# Patient Record
Sex: Female | Born: 1950
Health system: Southern US, Community
[De-identification: ages and names within clinical notes are randomized; demographics above are authoritative.]

## PROBLEM LIST (undated history)

## (undated) DIAGNOSIS — K219 Gastro-esophageal reflux disease without esophagitis: Secondary | ICD-10-CM

## (undated) DIAGNOSIS — E785 Hyperlipidemia, unspecified: Secondary | ICD-10-CM

## (undated) DIAGNOSIS — F32A Depression, unspecified: Secondary | ICD-10-CM

## (undated) DIAGNOSIS — T7840XA Allergy, unspecified, initial encounter: Secondary | ICD-10-CM

## (undated) DIAGNOSIS — R911 Solitary pulmonary nodule: Secondary | ICD-10-CM

## (undated) DIAGNOSIS — L930 Discoid lupus erythematosus: Secondary | ICD-10-CM

## (undated) DIAGNOSIS — R011 Cardiac murmur, unspecified: Secondary | ICD-10-CM

## (undated) DIAGNOSIS — H269 Unspecified cataract: Secondary | ICD-10-CM

## (undated) DIAGNOSIS — F329 Major depressive disorder, single episode, unspecified: Secondary | ICD-10-CM

## (undated) DIAGNOSIS — D219 Benign neoplasm of connective and other soft tissue, unspecified: Secondary | ICD-10-CM

## (undated) HISTORY — DX: Discoid lupus erythematosus: L93.0

## (undated) HISTORY — PX: CATARACT EXTRACTION, BILATERAL: SHX1313

## (undated) HISTORY — DX: Cardiac murmur, unspecified: R01.1

## (undated) HISTORY — DX: Benign neoplasm of connective and other soft tissue, unspecified: D21.9

## (undated) HISTORY — DX: Gastro-esophageal reflux disease without esophagitis: K21.9

## (undated) HISTORY — DX: Hyperlipidemia, unspecified: E78.5

## (undated) HISTORY — PX: INNER EAR SURGERY: SHX679

## (undated) HISTORY — PX: APPENDECTOMY: SHX54

## (undated) HISTORY — DX: Solitary pulmonary nodule: R91.1

## (undated) HISTORY — PX: COLONOSCOPY: SHX174

---

## 1989-03-10 HISTORY — PX: CHOLECYSTECTOMY: SHX55

## 1995-03-11 HISTORY — PX: ABDOMINAL HYSTERECTOMY: SHX81

## 1998-07-11 ENCOUNTER — Other Ambulatory Visit: Admission: RE | Admit: 1998-07-11 | Discharge: 1998-07-11 | Payer: Self-pay | Admitting: Obstetrics and Gynecology

## 1999-03-11 HISTORY — PX: HAND SURGERY: SHX662

## 1999-03-11 HISTORY — PX: BREAST SURGERY: SHX581

## 1999-08-13 ENCOUNTER — Other Ambulatory Visit: Admission: RE | Admit: 1999-08-13 | Discharge: 1999-08-13 | Payer: Self-pay | Admitting: Obstetrics and Gynecology

## 1999-09-18 ENCOUNTER — Encounter (INDEPENDENT_AMBULATORY_CARE_PROVIDER_SITE_OTHER): Payer: Self-pay | Admitting: Specialist

## 1999-09-18 ENCOUNTER — Ambulatory Visit (HOSPITAL_COMMUNITY): Admission: RE | Admit: 1999-09-18 | Discharge: 1999-09-18 | Payer: Self-pay | Admitting: General Surgery

## 1999-09-18 ENCOUNTER — Encounter: Payer: Self-pay | Admitting: General Surgery

## 2000-08-13 ENCOUNTER — Other Ambulatory Visit: Admission: RE | Admit: 2000-08-13 | Discharge: 2000-08-13 | Payer: Self-pay | Admitting: Obstetrics and Gynecology

## 2001-08-13 ENCOUNTER — Other Ambulatory Visit: Admission: RE | Admit: 2001-08-13 | Discharge: 2001-08-13 | Payer: Self-pay | Admitting: Obstetrics and Gynecology

## 2002-08-31 ENCOUNTER — Other Ambulatory Visit: Admission: RE | Admit: 2002-08-31 | Discharge: 2002-08-31 | Payer: Self-pay | Admitting: Obstetrics and Gynecology

## 2003-09-04 ENCOUNTER — Other Ambulatory Visit: Admission: RE | Admit: 2003-09-04 | Discharge: 2003-09-04 | Payer: Self-pay | Admitting: Obstetrics and Gynecology

## 2004-09-13 ENCOUNTER — Other Ambulatory Visit: Admission: RE | Admit: 2004-09-13 | Discharge: 2004-09-13 | Payer: Self-pay | Admitting: Addiction Medicine

## 2005-09-15 ENCOUNTER — Other Ambulatory Visit: Admission: RE | Admit: 2005-09-15 | Discharge: 2005-09-15 | Payer: Self-pay | Admitting: Obstetrics and Gynecology

## 2006-09-17 ENCOUNTER — Other Ambulatory Visit: Admission: RE | Admit: 2006-09-17 | Discharge: 2006-09-17 | Payer: Self-pay | Admitting: Obstetrics and Gynecology

## 2007-08-24 ENCOUNTER — Encounter: Payer: Self-pay | Admitting: Family Medicine

## 2007-09-23 ENCOUNTER — Other Ambulatory Visit: Admission: RE | Admit: 2007-09-23 | Discharge: 2007-09-23 | Payer: Self-pay | Admitting: Obstetrics and Gynecology

## 2008-10-02 ENCOUNTER — Encounter: Payer: Self-pay | Admitting: Family Medicine

## 2008-10-02 ENCOUNTER — Encounter: Payer: Self-pay | Admitting: Obstetrics and Gynecology

## 2008-10-02 ENCOUNTER — Other Ambulatory Visit: Admission: RE | Admit: 2008-10-02 | Discharge: 2008-10-02 | Payer: Self-pay | Admitting: Obstetrics and Gynecology

## 2008-10-02 ENCOUNTER — Ambulatory Visit: Payer: Self-pay | Admitting: Obstetrics and Gynecology

## 2008-10-02 LAB — CONVERTED CEMR LAB: Pap Smear: NORMAL

## 2008-12-04 ENCOUNTER — Encounter: Payer: Self-pay | Admitting: Family Medicine

## 2009-09-25 ENCOUNTER — Encounter: Payer: Self-pay | Admitting: Family Medicine

## 2009-10-09 ENCOUNTER — Encounter: Payer: Self-pay | Admitting: Family Medicine

## 2009-10-09 ENCOUNTER — Ambulatory Visit: Payer: Self-pay | Admitting: Obstetrics and Gynecology

## 2009-10-09 ENCOUNTER — Other Ambulatory Visit: Admission: RE | Admit: 2009-10-09 | Discharge: 2009-10-09 | Payer: Self-pay | Admitting: Obstetrics and Gynecology

## 2009-10-09 LAB — CONVERTED CEMR LAB: Pap Smear: NORMAL

## 2009-11-22 ENCOUNTER — Encounter: Payer: Self-pay | Admitting: Family Medicine

## 2009-11-22 ENCOUNTER — Ambulatory Visit: Payer: Self-pay | Admitting: Obstetrics and Gynecology

## 2010-01-15 ENCOUNTER — Ambulatory Visit: Payer: Self-pay | Admitting: Family Medicine

## 2010-01-15 DIAGNOSIS — K219 Gastro-esophageal reflux disease without esophagitis: Secondary | ICD-10-CM | POA: Insufficient documentation

## 2010-01-15 DIAGNOSIS — L930 Discoid lupus erythematosus: Secondary | ICD-10-CM

## 2010-01-15 DIAGNOSIS — Z860100 Personal history of colon polyps, unspecified: Secondary | ICD-10-CM | POA: Insufficient documentation

## 2010-01-16 LAB — CONVERTED CEMR LAB
ALT: 22 units/L (ref 0–35)
AST: 20 units/L (ref 0–37)
Albumin: 4.1 g/dL (ref 3.5–5.2)
Alkaline Phosphatase: 75 units/L (ref 39–117)
BUN: 14 mg/dL (ref 6–23)
Basophils Absolute: 0 10*3/uL (ref 0.0–0.1)
Basophils Relative: 0.7 % (ref 0.0–3.0)
Bilirubin, Direct: 0.2 mg/dL (ref 0.0–0.3)
CO2: 27 meq/L (ref 19–32)
Calcium: 9.2 mg/dL (ref 8.4–10.5)
Chloride: 106 meq/L (ref 96–112)
Creatinine, Ser: 0.8 mg/dL (ref 0.4–1.2)
Eosinophils Absolute: 0.1 10*3/uL (ref 0.0–0.7)
Eosinophils Relative: 1.3 % (ref 0.0–5.0)
GFR calc non Af Amer: 73.43 mL/min (ref >60)
Glucose, Bld: 86 mg/dL (ref 70–99)
HCT: 41.1 % (ref 36.0–46.0)
Hemoglobin: 14.3 g/dL (ref 12.0–15.0)
Lymphocytes Relative: 14.5 % (ref 12.0–46.0)
Lymphs Abs: 0.9 10*3/uL (ref 0.7–4.0)
MCHC: 34.8 g/dL (ref 30.0–36.0)
MCV: 97.4 fL (ref 78.0–100.0)
Monocytes Absolute: 0.4 10*3/uL (ref 0.1–1.0)
Monocytes Relative: 6.2 % (ref 3.0–12.0)
Neutro Abs: 5 10*3/uL (ref 1.4–7.7)
Neutrophils Relative %: 77.3 % — ABNORMAL HIGH (ref 43.0–77.0)
Platelets: 150 10*3/uL (ref 150.0–400.0)
Potassium: 4.3 meq/L (ref 3.5–5.1)
RBC: 4.22 M/uL (ref 3.87–5.11)
RDW: 13.3 % (ref 11.5–14.6)
Sodium: 140 meq/L (ref 135–145)
Total Bilirubin: 0.7 mg/dL (ref 0.3–1.2)
Total Protein: 6.2 g/dL (ref 6.0–8.3)
WBC: 6.5 10*3/uL (ref 4.5–10.5)

## 2010-01-29 ENCOUNTER — Encounter: Payer: Self-pay | Admitting: Family Medicine

## 2010-02-05 ENCOUNTER — Encounter (INDEPENDENT_AMBULATORY_CARE_PROVIDER_SITE_OTHER): Payer: Self-pay | Admitting: *Deleted

## 2010-02-05 ENCOUNTER — Encounter: Payer: Self-pay | Admitting: Family Medicine

## 2010-02-20 ENCOUNTER — Encounter: Payer: Self-pay | Admitting: Family Medicine

## 2010-04-09 NOTE — Letter (Signed)
Summary: [facility]   Imported By: Lanelle Bal 02/07/2010 11:26:31  _____________________________________________________________________  External Attachment:    Type:   Image     Comment:   External Document

## 2010-04-09 NOTE — Miscellaneous (Signed)
  Clinical Lists Changes  Observations: Added new observation of MAMMO DUE: 09/26/2010 (09/25/2009 11:50) Added new observation of MAMMOGRAM: Negative (09/25/2009 11:50)

## 2010-04-09 NOTE — Miscellaneous (Signed)
  Clinical Lists Changes  Observations: Added new observation of DEXANXTDUE: 11/2011 (01/29/2010 14:46) Added new observation of BONE DENSITY: Done (11/22/2009 14:49) Added new observation of PAP SMEAR: normal (10/09/2009 14:48) Added new observation of PAP SMEAR: normal (10/02/2008 14:47)      Preventive Care Screening  Bone Density:    Date:  11/22/2009    Next Due:  11/2011    Results:  Done  Pap Smear:    Date:  10/02/2008    Results:  normal 20

## 2010-04-09 NOTE — Letter (Signed)
Summary: [facility]  [facility]   Imported By: Maryln Gottron 02/04/2010 15:23:11  _____________________________________________________________________  External Attachment:    Type:   Image     Comment:   External Document

## 2010-04-09 NOTE — Letter (Signed)
Summary: [facility]   Imported By: Maryln Gottron 02/04/2010 15:24:58  _____________________________________________________________________  External Attachment:    Type:   Image     Comment:   External Document

## 2010-04-09 NOTE — Miscellaneous (Signed)
  Clinical Lists Changes  Observations: Added new observation of PAST MED HX: GERD- controlled with weight loss Discoid lupus- Dr. Azucena Cecil ([facility]) Dr. Mayford Knife (derm) Gyn- Dr. Oletha Blend (gyn/DXA) Mammogram per Dr. Yolanda Bonine h/o cardiac murmur Echo:  Mild aortic regurgitation within a tri-leaflet valve.  Normal left ventricular size and function.  Normal aortic size.  Mild tricuspid regurgitation. 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 Mayford Knife (derm) Gyn- Dr. Oletha Blend (gyn/DXA) Mammogram per Dr. Yolanda Bonine h/o cardiac murmur Echo:  Mild aortic regurgitation within a tri-leaflet valve.  Normal left ventricular size and function.  Normal aortic size.  Mild tricuspid regurgitation.

## 2010-04-09 NOTE — Assessment & Plan Note (Signed)
Summary: [facility] FROM [facility]   Vital Signs:  Patient profile:   60 year old female Height:      65.5 inches Weight:      155 pounds BMI:     25.49 Temp:     98.3 degrees F oral Pulse rate:   76 / minute Pulse rhythm:   regular BP sitting:   116 / 70  (left arm) Cuff size:   regular  Vitals Entered By: Sydell Axon LPN 16-Jan-2010 11:24 AM) CC: New patient to get established, transfer from [place]   History of Present Illness: tobacco abuse.  continues to smoke.  precontemplative.   Itchy rash, seasonal, on trunk.  Some better with diprolene.  Worse since September.  on hydroxychloroquine for lupus.  needs monitoring labs.    Feeling well o/w.   H/o colon polyps.  due for follow up with [facility].   Gerd improved with weight loss.   Allergies (verified): 1)  ! Chantix  Past History:  Family History: Last updated: 2010-01-16 Father: dead Alcoholism, lung cancer Mother:dead Alcoholism, lung cancer, elevated cholesterol, high blood pressure Siblings: Brother dead Jul 20, 2009, vascular disease.  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 Mayford Knife (derm) Gyn- Dr. Oletha Blend (gyn/DXA) Mammogram per Dr. Yolanda Bonine h/o cardiac murmur  Past Surgical History: Ear surgery  ~ 20-Jul-1960 Hand surgery  ~ Jul 21, 1999 Cholecystectomy  ~ 07/20/89 Hysterectomy  ~ 07-21-1994  Family History: Father: dead Alcoholism, lung cancer Mother:dead Alcoholism, lung cancer, elevated cholesterol, high blood pressure Siblings: Brother dead 2009-07-20, vascular disease.  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  Otherwise negative.    Physical Exam  General:  GEN: nad, alert and oriented HEENT: mucous membranes moist, TM with bilateral  perf, old finding NECK: supple w/o LA CV: rrr.  murmur noted.  PULM: ctab, no inc wob ABD: soft, +bs EXT: no edema SKIN: no acute rash but excorated lesions noted on trunk.  no fluctuance.    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  I would like derm input.  See notes on labs.  Of note, no help with rash/itching with detergent change.  This rash seems to happen in late fall each year.  Requesting old records.  Her updated medication list for this problem includes:    Plaquenil 200 Mg Tabs (Hydroxychloroquine sulfate) .Marland Kitchen... Take two times a day  Orders: TLB-BMP (Basic Metabolic Panel-BMET) (80048-METABOL) TLB-Hepatic/Liver Function Pnl (80076-HEPATIC) TLB-CBC Platelet - w/Differential (85025-CBCD)  Problem # 2:  COLONIC POLYPS, HX OF (ICD-V12.72) Pt to follow up with GI.  Problem # 3:  GERD (ICD-530.81) Improved.   Complete Medication List: 1)  Plaquenil 200 Mg Tabs (Hydroxychloroquine sulfate) .... Take two times a day  Patient Instructions: 1)  You can get your results through our phone system.  Follow the instructions on the blue card.  We'll get your old records.  Let me know if you have any concerns in the meantime.  I would follow up with dermatology  and GI.  Take care.  Glad to see you.    Orders Added: 1)  Est. Patient Level IV [51884] 2)  TLB-BMP (Basic Metabolic Panel-BMET) [80048-METABOL] 3)  TLB-Hepatic/Liver Function Pnl [80076-HEPATIC] 4)  TLB-CBC Platelet - w/Differential [85025-CBCD]   Immunization History:  Influenza Immunization History:    Influenza:  historical (01/08/2010)   Immunization History:  Influenza Immunization History:    Influenza:  Historical (01/08/2010)  Current  Allergies (reviewed today): ! CHANTIX

## 2010-04-09 NOTE — Letter (Signed)
Summary: Results Follow up Letter  [place] [facility]  [address] [place], [place] [address]   Phone: [phone]  Fax: [phone]    02/05/2010 MRN: [id number]    [facility] [address] [place], [place]  [address]    Dear Ms. Laboy,  The following are the results of your recent test(s):  Test         Result    Pap Smear:        Normal _____  Not Normal _____ Comments: ______________________________________________________ Cholesterol: LDL(Bad cholesterol):         Your goal is less than:         HDL (Good cholesterol):       Your goal is more than: Comments:  ______________________________________________________ Mammogram:        Normal __X___  Not Normal _____ Comments:  Yearly follow up is recommended. We received the copy from the July appointment.  ___________________________________________________________________ Hemoccult:        Normal _____  Not normal _______ Comments:    _____________________________________________________________________ Other Tests:    We routinely do not discuss normal results over the telephone.  If you desire a copy of the results, or you have any questions about this information we can discuss them at your next office visit.   Sincerely,    Dwana Curd. Para March, M.D.  [facility]

## 2010-04-11 NOTE — Procedures (Signed)
Summary: Colonoscopy by Dr.John Hayes,[facility]  Colonoscopy by Dr.John Hayes,[facility]   Imported By: Beau Fanny 03/05/2010 08:26:07  _____________________________________________________________________  External Attachment:    Type:   Image     Comment:   External Document

## 2010-04-26 ENCOUNTER — Ambulatory Visit (INDEPENDENT_AMBULATORY_CARE_PROVIDER_SITE_OTHER): Payer: BC Managed Care – PPO | Admitting: Obstetrics and Gynecology

## 2010-04-26 DIAGNOSIS — N764 Abscess of vulva: Secondary | ICD-10-CM

## 2010-04-29 ENCOUNTER — Ambulatory Visit (INDEPENDENT_AMBULATORY_CARE_PROVIDER_SITE_OTHER): Payer: BC Managed Care – PPO | Admitting: Obstetrics and Gynecology

## 2010-04-29 DIAGNOSIS — N764 Abscess of vulva: Secondary | ICD-10-CM

## 2010-05-09 ENCOUNTER — Ambulatory Visit (INDEPENDENT_AMBULATORY_CARE_PROVIDER_SITE_OTHER): Payer: BC Managed Care – PPO | Admitting: Obstetrics and Gynecology

## 2010-05-09 DIAGNOSIS — N764 Abscess of vulva: Secondary | ICD-10-CM

## 2010-07-26 NOTE — Op Note (Signed)
[facility]  Patient:    Ashlee Allen, Ashlee Allen                       MRN: [id number] Proc. Date: 09/18/99 Adm. Date:  [id number] Attending:  Glenna Fellows Tappan                           Operative Report  PREOPERATIVE DIAGNOSIS:  Right nipple discharge.  POSTOPERATIVE DIAGNOSIS:  Right nipple discharge.  OPERATION PERFORMED:  Right breast duct excision.  SURGEON:  Lorne Skeens. Hoxworth, M.D.  ANESTHESIA:  Local with intravenous sedation.  INDICATIONS FOR PROCEDURE:  Ashlee Allen is a 60 year old white female who presents with persistent right nipple single duct bloody spontaneous discharge.  Mammogram and physical exam were unremarkable.  Duct excision has been recommended and accepted.  The nature of the procedure, its indications and risks of bleeding and infection were discussed and understood preoperatively.  She is now brought to the operating room for this procedure.  DESCRIPTION OF PROCEDURE:  The patient was brought to the operating room and placed in supine position on the operating table and IV sedation was administered.  The right breast was sterilely prepped and draped.  Local anesthesia was used to infiltrate the areolar skin and underlying breast tissue.  A curvilinear incision was made at the areolar border inferiorly and dissection carried down into the subcutaneous tissues.  An areolar flap was created toward the nipple and using sharp and blunt dissection, the central ducts were completely encircled at the nipple and tied with a 2-0 silk tie. The ducts were then sharply dissected off of the nipple skin.  There was an obviously dilated central duct with some bloody discharge.  Once the ducts were released from the nipple skin, the central ducts were excised down into the breast tissue for several centimeters and excised.  Hemostasis was obtained with the cautery.  The subcutaneous was then reapproximated with interrupted 4-0 Monocryl  and the skin with running subcuticular 4-0 Monocryl and Steri-Strips.  Sponge, needle and instrument counts were correct.  Dry sterile dressing was applied and the patient taken to recovery in good condition. DD:  09/18/99 TD:  09/18/99 Job: [id number]

## 2010-07-31 ENCOUNTER — Encounter: Payer: Self-pay | Admitting: Family Medicine

## 2010-08-01 ENCOUNTER — Encounter: Payer: Self-pay | Admitting: Family Medicine

## 2010-08-01 ENCOUNTER — Ambulatory Visit (INDEPENDENT_AMBULATORY_CARE_PROVIDER_SITE_OTHER): Payer: BC Managed Care – PPO | Admitting: Family Medicine

## 2010-08-01 VITALS — BP 112/72 | HR 88 | Temp 97.8°F | Wt 157.0 lb

## 2010-08-01 DIAGNOSIS — J40 Bronchitis, not specified as acute or chronic: Secondary | ICD-10-CM

## 2010-08-01 MED ORDER — AZITHROMYCIN 250 MG PO TABS: ORAL_TABLET | ORAL | Status: DC

## 2010-08-01 MED ORDER — ALBUTEROL SULFATE HFA 108 (90 BASE) MCG/ACT IN AERS: 2.0000 | INHALATION_SPRAY | Freq: Four times a day (QID) | RESPIRATORY_TRACT | Status: DC | PRN

## 2010-08-01 NOTE — Assessment & Plan Note (Signed)
D/w pt about smoking.  Given duration and discolored sputum with exam today, I would use SABA and start zmax.  She understood. Okay for outpatient fu.  Call back as needed.  Nontoxic.

## 2010-08-01 NOTE — Progress Notes (Signed)
3 weeks of inc cough, discolored sputum in AM, occ wheeze.  No fevers.  No SOB.  Occ nasal congestion.  No ear pain.  Smoking, d/w pt about quitting.  Not on SABA.   ROS: See HPI.  Otherwise negative.    Meds, vitals, and allergies reviewed.   GEN: nad, alert and oriented HEENT: mucous membranes moist, TM w/o erythema but old B perfs noted, nasal epithelium injected, OP with mild cobblestoning NECK: supple w/o LA CV: rrr. PULM: no inc wob, coarse bs but no wheeze EXT: no edema

## 2010-08-01 NOTE — Patient Instructions (Signed)
Start the antibiotics today and use the inhaler as needed.  Let me know if you aren't getting better.  Take care.

## 2010-10-08 ENCOUNTER — Encounter: Payer: Self-pay | Admitting: Obstetrics and Gynecology

## 2010-10-08 ENCOUNTER — Encounter: Payer: Self-pay | Admitting: *Deleted

## 2010-10-09 ENCOUNTER — Encounter: Payer: Self-pay | Admitting: Family Medicine

## 2010-10-14 ENCOUNTER — Encounter: Payer: Self-pay | Admitting: Obstetrics and Gynecology

## 2010-10-14 ENCOUNTER — Ambulatory Visit (INDEPENDENT_AMBULATORY_CARE_PROVIDER_SITE_OTHER): Payer: BC Managed Care – PPO | Admitting: Obstetrics and Gynecology

## 2010-10-14 ENCOUNTER — Other Ambulatory Visit (HOSPITAL_COMMUNITY)
Admission: RE | Admit: 2010-10-14 | Discharge: 2010-10-14 | Disposition: A | Discharge status: Home / Self Care | Payer: BC Managed Care – PPO | Source: Ambulatory Visit | Attending: Obstetrics and Gynecology | Admitting: Obstetrics and Gynecology

## 2010-10-14 DIAGNOSIS — N951 Menopausal and female climacteric states: Secondary | ICD-10-CM

## 2010-10-14 DIAGNOSIS — N952 Postmenopausal atrophic vaginitis: Secondary | ICD-10-CM | POA: Insufficient documentation

## 2010-10-14 DIAGNOSIS — Z0000 Encounter for general adult medical examination without abnormal findings: Secondary | ICD-10-CM

## 2010-10-14 DIAGNOSIS — Z01419 Encounter for gynecological examination (general) (routine) without abnormal findings: Secondary | ICD-10-CM | POA: Insufficient documentation

## 2010-10-14 DIAGNOSIS — Z780 Asymptomatic menopausal state: Secondary | ICD-10-CM

## 2010-10-14 NOTE — Progress Notes (Signed)
The patient came back to see me today for an annual GYN exam. Her menopausal symptoms are relatively mild. She is however having a lot of dryness with intercourse. She is having intercourse approximately 3 times a week. She is up-to-date on mammograms and bone densities. She does her lab work elsewhere.  ROS: 9 systems reviewed with the patient. The only pertinent positive is that she is now seeing Dr. Nicholas Lose for her lupus. She seems to be doing well with current treatment. She is however also been treated for eczema.  Physical examination: HEENT within normal limits. Neck: Thyroid not large. No masses. Supraclavicular nodes: not enlarged. Breasts: Examined in both sitting midline position. No skin changes and no masses. Abdomen: Soft no guarding rebound or masses or hernia. Pelvic: External: Within normal limits. BUS: Within normal limits. Vaginal:within normal limits. Good estrogen effect. No evidence of cystocele rectocele or enterocele. Cervix and uterus absent. Adnexa: No masses. Rectovaginal exam: Confirmatory and negative. Extremities: Within normal limits.  Assessment: 1. Menopausal symptoms 2. Atrophic vaginitis  Plan: Continue yearly mammograms. It estradiol cream 0.02% 3 times a week in the vagina.

## 2010-12-25 ENCOUNTER — Ambulatory Visit (INDEPENDENT_AMBULATORY_CARE_PROVIDER_SITE_OTHER): Payer: BC Managed Care – PPO

## 2010-12-25 DIAGNOSIS — Z23 Encounter for immunization: Secondary | ICD-10-CM

## 2010-12-26 ENCOUNTER — Ambulatory Visit: Payer: BC Managed Care – PPO | Admitting: Family Medicine

## 2011-04-21 ENCOUNTER — Telehealth: Payer: Self-pay | Admitting: Family Medicine

## 2011-04-21 MED ORDER — AZITHROMYCIN 250 MG PO TABS: ORAL_TABLET | ORAL | Status: DC

## 2011-04-21 NOTE — Telephone Encounter (Signed)
Triage Record Num: [id number] Operator: Caswell Corwin Patient Name: Ashlee Allen Call Date & Time: 04/21/2011 9:38:27AM Patient Phone: [phone] PCP: Patient Gender: Female PCP Fax : Patient DOB: [birth year] Practice Name: [facility] Reason for Call: OFFICE NOTE! Caller: Tavonna/Patient; PCP: Crawford Givens Clelia Croft); CB#: [phone]; Call regarding Has Sinus Infection and Needs Zpack and Wants Ofloxacin Drops for Ear Called In. She had a cold 1.5 wks ago and she has ear congestion. Pt is afebrile. Triaged URI and all emergent SX R/O. PT WANTS A Z-PAK AND OFLOXACIN EAR GTTS CALLED IN FOR SINUS CONGESTION AND EAR CONGESTON. PLEASE CALL OT AT [phone] TO ADVISE. OFFICE NOTE! TY! Protocol(s) Used: Upper Respiratory Infection (URI) Recommended Outcome per Protocol: See Provider within 2 Weeks Reason for Outcome: Recent or recurrent episodes of sneezing, nasal congestion; watery nasal drainage; scratchy/itchy throat; red/itchy/watery eyes or cough unrelieved after one week of home care measures Care Advice: ~ Call provider if symptoms become severe, or if treatment that relieved the symptoms in the past is no longer working. 04/21/2011 9:47:07AM Page 1 of 1 CAN_TriageRpt_V2 [facility] Triage Call Report Triage Record Num: [id number] Operator: Caswell Corwin Patient Name: [name] Call Date & Time: 04/21/2011 9:38:27AM Patient Phone: [phone] PCP: Patient Gender: Female PCP Fax : Patient DOB: [birth year] Practice Name: [facility] Reason for Call: OFFICE NOTE! Caller: Nimrat/Patient; PCP: Crawford Givens Clelia Croft); CB#: [phone]; Call regarding Has Sinus Infection and Needs Zpack and Wants Ofloxacin Drops for Ear Called In. She had a cold 1.5 wks ago and she has ear congestion. Pt is afebrile. Triaged URI and all emergent SX R/O. PT WANTS A Z-PAK AND OFLOXACIN EAR GTTS CALLED IN FOR SINUS CONGESTION AND EAR CONGESTON. PLEASE CALL OT AT [phone] TO  ADVISE. OFFICE NOTE! TY! Protocol(s) Used: Office Note Recommended Outcome per Protocol: Information Noted and Sent to Office Reason for Outcome: Caller information to office Care Advice: ~ 04/21/2011 9:47:08AM Page 1 of 1 CAN_TriageRpt_V2

## 2011-04-21 NOTE — Telephone Encounter (Signed)
I called pt.  She is going to call ENT about the drops.  She still has facial pain, cough for 1.5 weeks.  Okay for zmax, but I want her to talk to ENT about the drops.  She agrees with plan.

## 2011-06-03 ENCOUNTER — Encounter: Payer: Self-pay | Admitting: Family Medicine

## 2011-06-03 ENCOUNTER — Ambulatory Visit (INDEPENDENT_AMBULATORY_CARE_PROVIDER_SITE_OTHER): Payer: BC Managed Care – PPO | Admitting: Family Medicine

## 2011-06-03 VITALS — BP 126/70 | HR 68 | Temp 98.3°F | Wt 161.8 lb

## 2011-06-03 DIAGNOSIS — M549 Dorsalgia, unspecified: Secondary | ICD-10-CM

## 2011-06-03 DIAGNOSIS — J40 Bronchitis, not specified as acute or chronic: Secondary | ICD-10-CM

## 2011-06-03 LAB — POCT URINALYSIS DIPSTICK
Bilirubin, UA: NEGATIVE
Glucose, UA: NEGATIVE
Ketones, UA: NEGATIVE
Leukocytes, UA: NEGATIVE
Protein, UA: NEGATIVE
Spec Grav, UA: 1.02

## 2011-06-03 MED ORDER — AZITHROMYCIN 250 MG PO TABS: ORAL_TABLET | ORAL | Status: AC

## 2011-06-03 NOTE — Patient Instructions (Signed)
Start the antibiotics and use the inhaler.  Let me know if I can help you stop smoking.  Take care. Get some rest.

## 2011-06-03 NOTE — Progress Notes (Signed)
duration of symptoms: started last week Rhinorrhea: yes congestion:yes ear pain: yes, mild on right side sore throat:no Cough: yes, some sputum-clear, with some wheeze Myalgias: some (lower back pain) but u/a is neg other concerns: HA  Husband is sick at home.    We discussed smoking.    ROS: See HPI.  Otherwise negative.    Meds, vitals, and allergies reviewed.   GEN: nad, alert and oriented HEENT: mucous membranes moist, TM w/o erythema but chronic changes noted, nasal epithelium injected, OP with cobblestoning NECK: supple w/o LA CV: rrr. PULM: no inc wob but wheeze, but coarse BS and ronchi ABD: soft, +bs EXT: no edema

## 2011-06-04 NOTE — Assessment & Plan Note (Signed)
Given the lung exam, I would treat with abx and restart SABA.  D/w pt about smoking.  Sample ventolin given with routine instruction.  She felt better after use.  F/u prn.  Nontoxic.

## 2011-10-13 ENCOUNTER — Encounter: Payer: Self-pay | Admitting: Family Medicine

## 2011-10-14 ENCOUNTER — Encounter: Payer: Self-pay | Admitting: *Deleted

## 2011-10-15 ENCOUNTER — Encounter: Payer: Self-pay | Admitting: Obstetrics and Gynecology

## 2011-10-15 ENCOUNTER — Ambulatory Visit (INDEPENDENT_AMBULATORY_CARE_PROVIDER_SITE_OTHER): Payer: BC Managed Care – PPO | Admitting: Obstetrics and Gynecology

## 2011-10-15 VITALS — BP 124/78 | Ht 65.0 in | Wt 159.0 lb

## 2011-10-15 DIAGNOSIS — Z01419 Encounter for gynecological examination (general) (routine) without abnormal findings: Secondary | ICD-10-CM

## 2011-10-15 MED ORDER — ACYCLOVIR 5 % EX CREA: 1.0000 "application " | TOPICAL_CREAM | CUTANEOUS | Status: DC | PRN

## 2011-10-15 MED ORDER — ESTRADIOL 0.1 MG/GM VA CREA: 1.0000 g | TOPICAL_CREAM | Freq: Every day | VAGINAL | Status: DC

## 2011-10-15 NOTE — Progress Notes (Signed)
The patient came to see me today for her annual GYN exam. She is doing well without HRT. She does have vaginal dryness that she would like treatment for. She is having no vaginal bleeding. She is having no pelvic pain. She is up-to-date on mammograms. Her last bone density was September, 2011 and was normal. She has always had normal Pap smears. She has had a total abdominal hysterectomy for fibroids. Her last Pap smear was 2012. She uses Zovirax cream for fever blisters.  HEENT: Within normal limits. Kennon Portela present. Neck: No masses. Supraclavicular lymph nodes: Not enlarged. Breasts: Examined in both sitting and lying position. Symmetrical without skin changes or masses. Abdomen: Soft no masses guarding or rebound. No hernias. Pelvic: External within normal limits. BUS within normal limits. Vaginal examination shows poor  estrogen effect, no cystocele enterocele or rectocele. Cervix and uterus absent. Adnexa within normal limits. Rectovaginal confirmatory. Extremities within normal limits.  Assessment: #1. Atrophic vaginitis #2. HSV 1  Plan: Continue yearly mammograms. Lab through PCP. Prescribed Estrace cream for vaginal dryness. Refilled Zovirax ointment.The new Pap smear guidelines were discussed with the patient. No pap done.

## 2011-10-16 LAB — URINALYSIS W MICROSCOPIC + REFLEX CULTURE
Casts: NONE SEEN
Hgb urine dipstick: NEGATIVE
Leukocytes, UA: NEGATIVE
Nitrite: NEGATIVE
Specific Gravity, Urine: 1.015 (ref 1.005–1.030)
Squamous Epithelial / LPF: NONE SEEN
pH: 6 (ref 5.0–8.0)

## 2011-12-19 ENCOUNTER — Encounter: Payer: Self-pay | Admitting: Family Medicine

## 2012-02-23 ENCOUNTER — Telehealth: Payer: Self-pay | Admitting: *Deleted

## 2012-02-23 NOTE — Telephone Encounter (Signed)
Patient advised.

## 2012-10-21 ENCOUNTER — Encounter: Payer: Self-pay | Admitting: Gynecology

## 2012-10-28 ENCOUNTER — Ambulatory Visit (INDEPENDENT_AMBULATORY_CARE_PROVIDER_SITE_OTHER): Payer: BC Managed Care – PPO | Admitting: Gynecology

## 2012-10-28 ENCOUNTER — Ambulatory Visit (HOSPITAL_COMMUNITY)
Admission: RE | Admit: 2012-10-28 | Discharge: 2012-10-28 | Disposition: A | Discharge status: Home / Self Care | Payer: BC Managed Care – PPO | Source: Ambulatory Visit | Attending: Gynecology | Admitting: Gynecology

## 2012-10-28 ENCOUNTER — Encounter: Payer: Self-pay | Admitting: Gynecology

## 2012-10-28 VITALS — BP 120/74 | Ht 63.25 in | Wt 158.0 lb

## 2012-10-28 DIAGNOSIS — N952 Postmenopausal atrophic vaginitis: Secondary | ICD-10-CM

## 2012-10-28 DIAGNOSIS — N951 Menopausal and female climacteric states: Secondary | ICD-10-CM | POA: Insufficient documentation

## 2012-10-28 DIAGNOSIS — Z01419 Encounter for gynecological examination (general) (routine) without abnormal findings: Secondary | ICD-10-CM

## 2012-10-28 DIAGNOSIS — Z90710 Acquired absence of both cervix and uterus: Secondary | ICD-10-CM | POA: Insufficient documentation

## 2012-10-28 DIAGNOSIS — Z1159 Encounter for screening for other viral diseases: Secondary | ICD-10-CM

## 2012-10-28 DIAGNOSIS — Z780 Asymptomatic menopausal state: Secondary | ICD-10-CM

## 2012-10-28 DIAGNOSIS — R32 Unspecified urinary incontinence: Secondary | ICD-10-CM

## 2012-10-28 DIAGNOSIS — Z87410 Personal history of cervical dysplasia: Secondary | ICD-10-CM | POA: Insufficient documentation

## 2012-10-28 DIAGNOSIS — F17200 Nicotine dependence, unspecified, uncomplicated: Secondary | ICD-10-CM | POA: Insufficient documentation

## 2012-10-28 NOTE — Patient Instructions (Addendum)
Shingles Vaccine What You Need to Know WHAT IS SHINGLES?  Shingles is a painful skin rash, often with blisters. It is also called Herpes Zoster or just Zoster.  A shingles rash usually appears on one side of the face or body and lasts from 2 to 4 weeks. Its main symptom is pain, which can be quite severe. Other symptoms of shingles can include fever, headache, chills, and upset stomach. Very rarely, a shingles infection can lead to pneumonia, hearing problems, blindness, brain inflammation (encephalitis), or death.  For about 1 person in 5, severe pain can continue even after the rash clears up. This is called post-herpetic neuralgia.  Shingles is caused by the Varicella Zoster virus. This is the same virus that causes chickenpox. Only someone who has had a case of chickenpox or rarely, has gotten chickenpox vaccine, can get shingles. The virus stays in your body. It can reappear many years later to cause a case of shingles.  You cannot catch shingles from another person with shingles. However, a person who has never had chickenpox (or chickenpox vaccine) could get chickenpox from someone with shingles. This is not very common.  Shingles is far more common in people 50 and older than in younger people. It is also more common in people whose immune systems are weakened because of a disease such as cancer or drugs such as steroids or chemotherapy.  At least 1 million people get shingles per year in the [place]. SHINGLES VACCINE  A vaccine for shingles was licensed in 2006. In clinical trials, the vaccine reduced the risk of shingles by 50%. It can also reduce the pain in people who still get shingles after being vaccinated.  A single dose of shingles vaccine is recommended for adults 60 years of age and older. SOME PEOPLE SHOULD NOT GET SHINGLES VACCINE OR SHOULD WAIT A person should not get shingles vaccine if he or she:  Has ever had a life-threatening allergic reaction to gelatin, the  antibiotic neomycin, or any other component of shingles vaccine. Tell your caregiver if you have any severe allergies.  Has a weakened immune system because of current:  AIDS or another disease that affects the immune system.  Treatment with drugs that affect the immune system, such as prolonged use of high-dose steroids.  Cancer treatment, such as radiation or chemotherapy.  Cancer affecting the bone marrow or lymphatic system, such as leukemia or lymphoma.  Is pregnant, or might be pregnant. Women should not become pregnant until at least 4 weeks after getting shingles vaccine. Someone with a minor illness, such as a cold, may be vaccinated. Anyone with a moderate or severe acute illness should usually wait until he or she recovers before getting the vaccine. This includes anyone with a temperature of 101.3 F (38 C) or higher. WHAT ARE THE RISKS FROM SHINGLES VACCINE?  A vaccine, like any medicine, could possibly cause serious problems, such as severe allergic reactions. However, the risk of a vaccine causing serious harm, or death, is extremely small.  No serious problems have been identified with shingles vaccine. Mild Problems  Redness, soreness, swelling, or itching at the site of the injection (about 1 person in 3).  Headache (about 1 person in 70). Like all vaccines, shingles vaccine is being closely monitored for unusual or severe problems. WHAT IF THERE IS A MODERATE OR SEVERE REACTION? What should I look for? Any unusual condition, such as a severe allergic reaction or a high fever. If a severe allergic reaction   occurred, it would be within a few minutes to an hour after the shot. Signs of a serious allergic reaction can include difficulty breathing, weakness, hoarseness or wheezing, a fast heartbeat, hives, dizziness, paleness, or swelling of the throat. What should I do?  Call your caregiver, or get the person to a caregiver right away.  Tell the caregiver what  happened, the date and time it happened, and when the vaccination was given.  Ask the caregiver to report the reaction by filing a Vaccine Adverse Event Reporting System (VAERS) form. Or, you can file this report through the VAERS web site at www.vaers.LAgents.no or by calling 1-404-003-6499. VAERS does not provide medical advice. HOW CAN I LEARN MORE?  Ask your caregiver. He or she can give you the vaccine package insert or suggest other sources of information.  Contact the Centers for Disease Control and Prevention (CDC):  Call (409) 145-5542 (1-800-CDC-INFO).  Visit the CDC website at PicCapture.uy CDC Shingles Vaccine VIS (12/14/07) Document Released: 12/22/2005 Document Revised: 05/19/2011 Document Reviewed: 12/14/2007 [employer] Patient Information 2014 [place], [place]. Kegel Exercises The goal of Kegel exercises is to isolate and exercise your pelvic floor muscles. These muscles act as a hammock that supports the rectum, vagina, small intestine, and uterus. As the muscles weaken, the hammock sags and these organs are displaced from their normal positions. Kegel exercises can strengthen your pelvic floor muscles and help you to improve bladder and bowel control, improve sexual response, and help reduce many problems and some discomfort during pregnancy. Kegel exercises can be done anywhere and at any time. HOW TO PERFORM KEGEL EXERCISES 1. Locate your pelvic floor muscles. To do this, squeeze (contract) the muscles that you use when you try to stop the flow of urine. You will feel a tightness in the vaginal area (women) and a tight lift in the rectal area (men and women). 2. When you begin, contract your pelvic muscles tight for 2 5 seconds, then relax them for 2 5 seconds. This is one set. Do 4 5 sets with a short pause in between. 3. Contract your pelvic muscles for 8 10 seconds, then relax them for 8 10 seconds. Do 4 5 sets. If you cannot contract your pelvic muscles for 8 10 seconds,  try 5 7 seconds and work your way up to 8 10 seconds. Your goal is 4 5 sets of 10 contractions each day. Keep your stomach, buttocks, and legs relaxed during the exercises. Perform sets of both short and long contractions. Vary your positions. Perform these contractions 3 4 times per day. Perform sets while you are:   Lying in bed in the morning.  Standing at lunch.  Sitting in the late afternoon.  Lying in bed at night. You should do 40 50 contractions per day. Do not perform more Kegel exercises per day than recommended. Overexercising can cause muscle fatigue. Continue these exercises for for at least 15 20 weeks or as directed by your caregiver. Document Released: 02/11/2012 Document Reviewed: 11/20/2011 [facility] Patient Information 2014 [place], [place]. Smoking Cessation Quitting smoking is important to your health and has many advantages. However, it is not always easy to quit since nicotine is a very addictive drug. Often times, people try 3 times or more before being able to quit. This document explains the best ways for you to prepare to quit smoking. Quitting takes hard work and a lot of effort, but you can do it. ADVANTAGES OF QUITTING SMOKING  You will live longer, feel better, and live  better.  Your body will feel the impact of quitting smoking almost immediately.  Within 20 minutes, blood pressure decreases. Your pulse returns to its normal level.  After 8 hours, carbon monoxide levels in the blood return to normal. Your oxygen level increases.  After 24 hours, the chance of having a heart attack starts to decrease. Your breath, hair, and body stop smelling like smoke.  After 48 hours, damaged nerve endings begin to recover. Your sense of taste and smell improve.  After 72 hours, the body is virtually free of nicotine. Your bronchial tubes relax and breathing becomes easier.  After 2 to 12 weeks, lungs can hold more air. Exercise becomes easier and circulation  improves.  The risk of having a heart attack, stroke, cancer, or lung disease is greatly reduced.  After 1 year, the risk of coronary heart disease is cut in half.  After 5 years, the risk of stroke falls to the same as a nonsmoker.  After 10 years, the risk of lung cancer is cut in half and the risk of other cancers decreases significantly.  After 15 years, the risk of coronary heart disease drops, usually to the level of a nonsmoker.  If you are pregnant, quitting smoking will improve your chances of having a healthy baby.  The people you live with, especially any children, will be healthier.  You will have extra money to spend on things other than cigarettes. QUESTIONS TO THINK ABOUT BEFORE ATTEMPTING TO QUIT You may want to talk about your answers with your caregiver.  Why do you want to quit?  If you tried to quit in the past, what helped and what did not?  What will be the most difficult situations for you after you quit? How will you plan to handle them?  Who can help you through the tough times? Your family? Friends? A caregiver?  What pleasures do you get from smoking? What ways can you still get pleasure if you quit? Here are some questions to ask your caregiver:  How can you help me to be successful at quitting?  What medicine do you think would be best for me and how should I take it?  What should I do if I need more help?  What is smoking withdrawal like? How can I get information on withdrawal? GET READY  Set a quit date.  Change your environment by getting rid of all cigarettes, ashtrays, matches, and lighters in your home, car, or work. Do not let people smoke in your home.  Review your past attempts to quit. Think about what worked and what did not. GET SUPPORT AND ENCOURAGEMENT You have a better chance of being successful if you have help. You can get support in many ways.  Tell your family, friends, and co-workers that you are going to quit and need  their support. Ask them not to smoke around you.  Get individual, group, or telephone counseling and support. Programs are available at [employer] and health centers. Call your local health department for information about programs in your area.  Spiritual beliefs and practices may help some smokers quit.  Download a "quit meter" on your computer to keep track of quit statistics, such as how long you have gone without smoking, cigarettes not smoked, and money saved.  Get a self-help book about quitting smoking and staying off of tobacco. LEARN NEW SKILLS AND BEHAVIORS  Distract yourself from urges to smoke. Talk to someone, go for a walk, or occupy your  time with a task.  Change your normal routine. Take a different route to work. Drink tea instead of coffee. Eat breakfast in a different place.  Reduce your stress. Take a hot bath, exercise, or read a book.  Plan something enjoyable to do every day. Reward yourself for not smoking.  Explore interactive web-based programs that specialize in helping you quit. GET MEDICINE AND USE IT CORRECTLY Medicines can help you stop smoking and decrease the urge to smoke. Combining medicine with the above behavioral methods and support can greatly increase your chances of successfully quitting smoking.  Nicotine replacement therapy helps deliver nicotine to your body without the negative effects and risks of smoking. Nicotine replacement therapy includes nicotine gum, lozenges, inhalers, nasal sprays, and skin patches. Some may be available over-the-counter and others require a prescription.  Antidepressant medicine helps people abstain from smoking, but how this works is unknown. This medicine is available by prescription.  Nicotinic receptor partial agonist medicine simulates the effect of nicotine in your brain. This medicine is available by prescription. Ask your caregiver for advice about which medicines to use and how to use them based on  your health history. Your caregiver will tell you what side effects to look out for if you choose to be on a medicine or therapy. Carefully read the information on the package. Do not use any other product containing nicotine while using a nicotine replacement product.  RELAPSE OR DIFFICULT SITUATIONS Most relapses occur within the first 3 months after quitting. Do not be discouraged if you start smoking again. Remember, most people try several times before finally quitting. You may have symptoms of withdrawal because your body is used to nicotine. You may crave cigarettes, be irritable, feel very hungry, cough often, get headaches, or have difficulty concentrating. The withdrawal symptoms are only temporary. They are strongest when you first quit, but they will go away within 10 14 days. To reduce the chances of relapse, try to:  Avoid drinking alcohol. Drinking lowers your chances of successfully quitting.  Reduce the amount of caffeine you consume. Once you quit smoking, the amount of caffeine in your body increases and can give you symptoms, such as a rapid heartbeat, sweating, and anxiety.  Avoid smokers because they can make you want to smoke.  Do not let weight gain distract you. Many smokers will gain weight when they quit, usually less than 10 pounds. Eat a healthy diet and stay active. You can always lose the weight gained after you quit.  Find ways to improve your mood other than smoking. FOR MORE INFORMATION  www.smokefree.gov  Document Released: 02/18/2001 Document Revised: 08/26/2011 Document Reviewed: 06/05/2011 [facility] Patient Information 2014 [place], [place].

## 2012-10-28 NOTE — Progress Notes (Addendum)
Ashlee Allen December 05, 1950 [id number]   History:    62 y.o.  for annual gyn exam with no complaints today. Review of patient's records indicated that she had a total abdominal hysterectomy for fibroids along with a Burch bladder suspension back in 1997. She was on vaginal estrogen twice a week and is no longer on it. She states that occasionally she still will leak urine when coughing or straining and she does wear a pad. She is a chronic smoker for many years and currently smokes one half packs per day and tried Chantix in the past it made her sick. Her last bone density study was normal in 2011. Her mammogram this year was normal and she states that occasionally she will do her breast exam. Her vaccines are up-to-date with the exception of the shingles vaccine. Patient has history of lupus.patient with no prior history of abnormal Pap smears.  Past medical history,surgical history, family history and social history were all reviewed and documented in the EPIC chart.  Gynecologic History No LMP recorded. Patient has had a hysterectomy. Contraception: status post hysterectomy Last Pap: 2012. Results were: normal Last mammogram: 2014. Results were: normal  Obstetric History OB History  Gravida Para Term Preterm AB SAB TAB Ectopic Multiple Living  3 2 2  1     2     # Outcome Date GA Lbr Len/2nd Weight Sex Delivery Anes PTL Lv  3 ABT           2 TRM           1 TRM                ROS: A ROS was performed and pertinent positives and negatives are included in the history.  GENERAL: No fevers or chills. HEENT: No change in vision, no earache, sore throat or sinus congestion. NECK: No pain or stiffness. CARDIOVASCULAR: No chest pain or pressure. No palpitations. PULMONARY: No shortness of breath, cough or wheeze. GASTROINTESTINAL: No abdominal pain, nausea, vomiting or diarrhea, melena or bright red blood per rectum. GENITOURINARY: No urinary frequency, urgency, hesitancy or dysuria.  MUSCULOSKELETAL: No joint or muscle pain, no back pain, no recent trauma. DERMATOLOGIC: No rash, no itching, no lesions. ENDOCRINE: No polyuria, polydipsia, no heat or cold intolerance. No recent change in weight. HEMATOLOGICAL: No anemia or easy bruising or bleeding. NEUROLOGIC: No headache, seizures, numbness, tingling or weakness. PSYCHIATRIC: No depression, no loss of interest in normal activity or change in sleep pattern.     Exam: chaperone present  BP 120/74  Ht 5' 3.25" (1.607 m)  Wt 158 lb (71.668 kg)  BMI 27.75 kg/m2  Body mass index is 27.75 kg/(m^2).  General appearance : Well developed well nourished female. No acute distress HEENT: Neck supple, trachea midline, no carotid bruits, no thyroidmegaly Lungs: Clear to auscultation, no rhonchi or wheezes, or rib retractions  Heart: Regular rate and rhythm, no murmurs or gallops Breast:Examined in sitting and supine position were symmetrical in appearance, no palpable masses or tenderness,  no skin retraction, no nipple inversion, no nipple discharge, no skin discoloration, no axillary or supraclavicular lymphadenopathy Abdomen: no palpable masses or tenderness, no rebound or guarding Extremities: no edema or skin discoloration or tenderness  Pelvic:  Bartholin, Urethra, Skene Glands: Within normal limits             Vagina: No gross lesions or discharge  Cervix:Absent  Uterus absent  Adnexa  Without masses or tenderness  Anus and perineum  normal  Rectovaginal  normal sphincter tone without palpated masses or tenderness             Hemoccult Course provided     Assessment/Plan:  62 y.o. female for annual exam postmenopausal no longer on vaginal estrogen. Mild stress urinary incontinence. We'll provide ligature information on Kegel exercises. We had a discussion once again all the detrimental effects of smoking. She will be referred to local support group to assist her in this continuing this bad habits. She will be sent for  chest x-ray PA and lateral today. She was reminded to submit to the office in Hemoccult cards for testing. She will check with her primary physician in reference to her shingles vaccine.  New CDC guidelines is recommending patients be tested once in her lifetime for hepatitis C antibody who were born between [birth year] through 1966. This was discussed with the patient today and has agreed to be tested today.    Ok Edwards MD, 9:34 AM 10/28/2012

## 2012-11-01 ENCOUNTER — Other Ambulatory Visit: Payer: Self-pay | Admitting: Gynecology

## 2012-11-01 ENCOUNTER — Telehealth: Payer: Self-pay

## 2012-11-01 DIAGNOSIS — Z13820 Encounter for screening for osteoporosis: Secondary | ICD-10-CM

## 2012-11-01 NOTE — Telephone Encounter (Signed)
Pt's husband left v/m wanting to know when pt needs next colonoscopy; pt had last colonoscopy at [facility] on 02/20/10 according to pts record.Please advise.

## 2012-11-01 NOTE — Telephone Encounter (Signed)
I thought she was to have 5 year f/u on the colonoscopy.  It would be reasonable to stay with [facility] as she has seen that clinic prev.  I would have her call GI to make sure, but I don't think she is due yet.  Thanks.

## 2012-11-02 NOTE — Telephone Encounter (Signed)
Patient advised.

## 2012-12-03 ENCOUNTER — Encounter: Payer: Self-pay | Admitting: Family Medicine

## 2012-12-30 ENCOUNTER — Ambulatory Visit (INDEPENDENT_AMBULATORY_CARE_PROVIDER_SITE_OTHER): Payer: BC Managed Care – PPO

## 2012-12-30 DIAGNOSIS — Z13820 Encounter for screening for osteoporosis: Secondary | ICD-10-CM

## 2012-12-31 ENCOUNTER — Other Ambulatory Visit: Payer: Self-pay | Admitting: *Deleted

## 2012-12-31 DIAGNOSIS — Z13820 Encounter for screening for osteoporosis: Secondary | ICD-10-CM

## 2013-01-04 ENCOUNTER — Other Ambulatory Visit: Payer: BC Managed Care – PPO

## 2013-01-04 DIAGNOSIS — Z13820 Encounter for screening for osteoporosis: Secondary | ICD-10-CM

## 2013-01-05 ENCOUNTER — Other Ambulatory Visit: Payer: BC Managed Care – PPO

## 2013-01-05 LAB — VITAMIN D 25 HYDROXY (VIT D DEFICIENCY, FRACTURES): Vit D, 25-Hydroxy: 41 ng/mL (ref 30–89)

## 2013-01-05 LAB — PTH, INTACT AND CALCIUM: Calcium: 9.3 mg/dL (ref 8.4–10.5)

## 2013-04-28 ENCOUNTER — Encounter: Payer: Self-pay | Admitting: Family Medicine

## 2013-04-28 ENCOUNTER — Ambulatory Visit (INDEPENDENT_AMBULATORY_CARE_PROVIDER_SITE_OTHER): Payer: BC Managed Care – PPO | Admitting: Family Medicine

## 2013-04-28 VITALS — BP 122/66 | HR 77 | Temp 98.0°F | Wt 161.5 lb

## 2013-04-28 DIAGNOSIS — S6990XA Unspecified injury of unspecified wrist, hand and finger(s), initial encounter: Secondary | ICD-10-CM

## 2013-04-28 DIAGNOSIS — S61409A Unspecified open wound of unspecified hand, initial encounter: Secondary | ICD-10-CM

## 2013-04-28 DIAGNOSIS — J40 Bronchitis, not specified as acute or chronic: Secondary | ICD-10-CM

## 2013-04-28 DIAGNOSIS — Z23 Encounter for immunization: Secondary | ICD-10-CM

## 2013-04-28 DIAGNOSIS — S61419A Laceration without foreign body of unspecified hand, initial encounter: Secondary | ICD-10-CM

## 2013-04-28 NOTE — Progress Notes (Signed)
Pre-visit discussion using our clinic review tool. No additional management support is needed unless otherwise documented below in the visit note.  5PM yesterday.  Accidentally cut her L hand on a barb wire fence.  ~1cm.  L palm.  Distally NV intact w/o ROM deficit.  Not bleeding now.  Cleaned with hot soapy water and neosporin last night.    Cough.  Chest congestion.  No fevers.  No sputum usually, occ clear sputum.  No ear pain or ST.  Some rhinorrhea, occ.  Not SOB.  Hasn't had to use her SABA.    Meds, vitals, and allergies reviewed.   ROS: See HPI.  Otherwise, noncontributory.  GEN: nad, alert and oriented HEENT: mucous membranes moist, tm w/o erythema, nasal exam w/o erythema, clear discharge noted,  OP with cobblestoning NECK: supple w/o LA CV: rrr.   PULM: ctab except for occ mild rhonchi B, no inc wob EXT: no edema SKIN: superficial L palm lesion, 1 cm, at distal 4th MC, distally NV intact.   No FB on inspection with magnification.  Wound doesn't gape with ROM of hand. Not bleeding.   

## 2013-04-28 NOTE — Patient Instructions (Signed)
It doesn't look like you need antibiotics at this point.  Keep the strips on for now.  Notify us if any fever or spreading redness or pus drainage.  Use the inhaler as needed.  Take care.

## 2013-04-29 ENCOUNTER — Telehealth: Payer: Self-pay | Admitting: Family Medicine

## 2013-04-29 DIAGNOSIS — S61419A Laceration without foreign body of unspecified hand, initial encounter: Secondary | ICD-10-CM | POA: Insufficient documentation

## 2013-04-29 NOTE — Assessment & Plan Note (Signed)
Likely a smoker's cough, possible viral source.  Benign exam, d/w pt about smoking cessation ans SABA use.  Wouldn't start abx or pred now. No wheeze on exam.  No inc in wob.

## 2013-04-29 NOTE — Telephone Encounter (Signed)
Relevant patient education assigned to patient using Emmi. ° °

## 2013-04-29 NOTE — Assessment & Plan Note (Signed)
Soaked and irrigated, superficial and clean appearing. No FB on magnification.  Closed with 2 small steristrips with routine cautions.  She agrees.  Tetanus updated.

## 2013-05-17 ENCOUNTER — Other Ambulatory Visit: Payer: Self-pay | Admitting: Physician Assistant

## 2013-05-17 ENCOUNTER — Encounter (HOSPITAL_BASED_OUTPATIENT_CLINIC_OR_DEPARTMENT_OTHER): Payer: Self-pay | Admitting: *Deleted

## 2013-05-17 NOTE — Progress Notes (Signed)
No labs needed

## 2013-05-17 NOTE — H&P (Signed)
Ashlee Allen is an 63 y.o. female.   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  Failed conservative treatment wanting to proceed with surgery.  Past Medical History  Diagnosis Date  . GERD (gastroesophageal reflux disease)     controllled with weight loss   . Discoid lupus     Dr. Kalman Shan (prev seen at [facility]) and Dr. Ubaldo Glassing  . Fibroid   . Depression   . Cardiac murmur     echo 8-9 yr ago-not sure where-no tx    Past Surgical History  Procedure Laterality Date  . Hand surgery  2001    ctr-both  . Inner ear surgery      left  . Cholecystectomy  1991  . Abdominal hysterectomy  1997    TAH  Burch  . Colonoscopy    . Breast surgery  2001    Duct excised-rt    Family History  Problem Relation Age of Onset  . Alcohol abuse Mother   . Hyperlipidemia Mother   . Hypertension Mother   . Diabetes Mother   . Alcohol abuse Father   . Cancer Father     lung  . Seizures Brother    Social History:  reports that she has been smoking.  She has never used smokeless tobacco. She reports that she does not drink alcohol or use illicit drugs.  Allergies:  Allergies  Allergen Reactions  . Varenicline Tartrate     REACTION: nightmares     (Not in a hospital admission)  No results found for this or any previous visit (from the past 48 hour(s)). No results found.  Review of Systems  Constitutional: Negative.   HENT: Positive for hearing loss. Negative for congestion, ear discharge, ear pain, nosebleeds, sore throat and tinnitus.   Eyes: Negative.   Respiratory: Negative.  Negative for stridor.   Cardiovascular: Negative.   Gastrointestinal: Negative.   Genitourinary: Negative.   Musculoskeletal: Positive for joint pain.  Skin: Positive for rash. Negative for itching.  Neurological: Negative.  Negative for headaches.  Endo/Heme/Allergies: Negative.    Psychiatric/Behavioral: Negative.     There were no vitals taken for this visit. Physical Exam  Constitutional: She is oriented to person, place, and time. She appears well-developed and well-nourished. No distress.  HENT:  Head: Normocephalic and atraumatic.  Nose: Nose normal.  Eyes: Conjunctivae and EOM are normal. Pupils are equal, round, and reactive to light.  Neck: Normal range of motion. Neck supple.  Cardiovascular: Normal rate, regular rhythm and intact distal pulses.   Respiratory: Effort normal. No respiratory distress.  GI: Soft. She exhibits no distension.  Musculoskeletal:       Left knee: She exhibits swelling and bony tenderness. She exhibits no effusion, no ecchymosis, no deformity, no laceration, no erythema, normal alignment, no LCL laxity and no MCL laxity. Tenderness found. Medial joint line and lateral joint line tenderness noted.  Neurological: She is alert and oriented to person, place, and time.  Skin: Skin is warm and dry. No erythema.  Psychiatric: She has a normal mood and affect. Her behavior is normal.     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  This will be done as outpatient procedure general anesthesia.    Chriss Czar 05/17/2013, 1:31 PM

## 2013-05-20 ENCOUNTER — Encounter (HOSPITAL_BASED_OUTPATIENT_CLINIC_OR_DEPARTMENT_OTHER): Payer: Self-pay | Admitting: *Deleted

## 2013-05-20 ENCOUNTER — Encounter (HOSPITAL_BASED_OUTPATIENT_CLINIC_OR_DEPARTMENT_OTHER)
Admission: RE | Disposition: A | Discharge status: Home / Self Care | Payer: Self-pay | Source: Ambulatory Visit | Attending: Orthopedic Surgery

## 2013-05-20 ENCOUNTER — Ambulatory Visit (HOSPITAL_BASED_OUTPATIENT_CLINIC_OR_DEPARTMENT_OTHER): Payer: BC Managed Care – PPO | Admitting: Certified Registered"

## 2013-05-20 ENCOUNTER — Encounter (HOSPITAL_BASED_OUTPATIENT_CLINIC_OR_DEPARTMENT_OTHER): Payer: BC Managed Care – PPO | Admitting: Certified Registered"

## 2013-05-20 ENCOUNTER — Ambulatory Visit (HOSPITAL_BASED_OUTPATIENT_CLINIC_OR_DEPARTMENT_OTHER)
Admission: RE | Admit: 2013-05-20 | Discharge: 2013-05-20 | Disposition: A | Discharge status: Home / Self Care | Payer: BC Managed Care – PPO | Source: Ambulatory Visit | Attending: Orthopedic Surgery | Admitting: Orthopedic Surgery

## 2013-05-20 DIAGNOSIS — K219 Gastro-esophageal reflux disease without esophagitis: Secondary | ICD-10-CM | POA: Insufficient documentation

## 2013-05-20 DIAGNOSIS — F17200 Nicotine dependence, unspecified, uncomplicated: Secondary | ICD-10-CM | POA: Insufficient documentation

## 2013-05-20 DIAGNOSIS — R011 Cardiac murmur, unspecified: Secondary | ICD-10-CM | POA: Insufficient documentation

## 2013-05-20 DIAGNOSIS — M2240 Chondromalacia patellae, unspecified knee: Secondary | ICD-10-CM | POA: Insufficient documentation

## 2013-05-20 DIAGNOSIS — M1710 Unilateral primary osteoarthritis, unspecified knee: Secondary | ICD-10-CM | POA: Insufficient documentation

## 2013-05-20 DIAGNOSIS — S83289A Other tear of lateral meniscus, current injury, unspecified knee, initial encounter: Secondary | ICD-10-CM | POA: Insufficient documentation

## 2013-05-20 DIAGNOSIS — IMO0002 Reserved for concepts with insufficient information to code with codable children: Secondary | ICD-10-CM | POA: Insufficient documentation

## 2013-05-20 DIAGNOSIS — F329 Major depressive disorder, single episode, unspecified: Secondary | ICD-10-CM | POA: Insufficient documentation

## 2013-05-20 DIAGNOSIS — M6750 Plica syndrome, unspecified knee: Secondary | ICD-10-CM | POA: Insufficient documentation

## 2013-05-20 DIAGNOSIS — X58XXXA Exposure to other specified factors, initial encounter: Secondary | ICD-10-CM | POA: Insufficient documentation

## 2013-05-20 DIAGNOSIS — F3289 Other specified depressive episodes: Secondary | ICD-10-CM | POA: Insufficient documentation

## 2013-05-20 DIAGNOSIS — L930 Discoid lupus erythematosus: Secondary | ICD-10-CM | POA: Insufficient documentation

## 2013-05-20 HISTORY — PX: KNEE ARTHROSCOPY: SHX127

## 2013-05-20 HISTORY — DX: Depression, unspecified: F32.A

## 2013-05-20 HISTORY — DX: Major depressive disorder, single episode, unspecified: F32.9

## 2013-05-20 LAB — POCT HEMOGLOBIN-HEMACUE: Hemoglobin: 14.2 g/dL (ref 12.0–15.0)

## 2013-05-20 SURGERY — ARTHROSCOPY, KNEE
Anesthesia: General | Site: Knee | Laterality: Left

## 2013-05-20 MED ORDER — EPINEPHRINE HCL 1 MG/ML IJ SOLN
INTRAMUSCULAR | Status: DC | PRN
  Administered 2013-05-20: 1 mg

## 2013-05-20 MED ORDER — DEXAMETHASONE SODIUM PHOSPHATE 10 MG/ML IJ SOLN
INTRAMUSCULAR | Status: DC | PRN
  Administered 2013-05-20: 10 mg via INTRAVENOUS

## 2013-05-20 MED ORDER — HYDROMORPHONE HCL PF 1 MG/ML IJ SOLN
0.2500 mg | INTRAMUSCULAR | Status: DC | PRN
Start: 2013-05-20 — End: 2013-05-20

## 2013-05-20 MED ORDER — CEFAZOLIN SODIUM-DEXTROSE 2-3 GM-% IV SOLR
INTRAVENOUS | Status: AC
  Filled 2013-05-20: qty 50

## 2013-05-20 MED ORDER — FENTANYL CITRATE 0.05 MG/ML IJ SOLN
50.0000 ug | INTRAMUSCULAR | Status: DC | PRN
  Administered 2013-05-20: 100 ug via INTRAVENOUS

## 2013-05-20 MED ORDER — CEFAZOLIN SODIUM-DEXTROSE 2-3 GM-% IV SOLR
2.0000 g | INTRAVENOUS | Status: AC
  Administered 2013-05-20: 2 g via INTRAVENOUS

## 2013-05-20 MED ORDER — BUPIVACAINE-EPINEPHRINE 0.5% -1:200000 IJ SOLN
INTRAMUSCULAR | Status: DC | PRN
  Administered 2013-05-20: 15 mL

## 2013-05-20 MED ORDER — PROMETHAZINE HCL 25 MG/ML IJ SOLN
6.2500 mg | INTRAMUSCULAR | Status: DC | PRN
Start: 2013-05-20 — End: 2013-05-20

## 2013-05-20 MED ORDER — FENTANYL CITRATE 0.05 MG/ML IJ SOLN
INTRAMUSCULAR | Status: DC | PRN
  Administered 2013-05-20 (×2): 50 ug via INTRAVENOUS

## 2013-05-20 MED ORDER — METHYLPREDNISOLONE ACETATE 80 MG/ML IJ SUSP
INTRAMUSCULAR | Status: AC
  Filled 2013-05-20: qty 1

## 2013-05-20 MED ORDER — CHLORHEXIDINE GLUCONATE 4 % EX LIQD: 60.0000 mL | Freq: Once | CUTANEOUS | Status: DC

## 2013-05-20 MED ORDER — METHYLPREDNISOLONE ACETATE 40 MG/ML IJ SUSP
INTRAMUSCULAR | Status: DC | PRN
  Administered 2013-05-20: 40 mg

## 2013-05-20 MED ORDER — METHYLPREDNISOLONE ACETATE 40 MG/ML IJ SUSP
INTRAMUSCULAR | Status: AC
  Filled 2013-05-20: qty 1

## 2013-05-20 MED ORDER — OXYCODONE HCL 5 MG/5ML PO SOLN: 5.0000 mg | Freq: Once | ORAL | Status: DC | PRN

## 2013-05-20 MED ORDER — ONDANSETRON HCL 4 MG/2ML IJ SOLN
INTRAMUSCULAR | Status: DC | PRN
  Administered 2013-05-20: 4 mg via INTRAVENOUS

## 2013-05-20 MED ORDER — FENTANYL CITRATE 0.05 MG/ML IJ SOLN
INTRAMUSCULAR | Status: AC
  Filled 2013-05-20: qty 2

## 2013-05-20 MED ORDER — FENTANYL CITRATE 0.05 MG/ML IJ SOLN
INTRAMUSCULAR | Status: AC
  Filled 2013-05-20: qty 6

## 2013-05-20 MED ORDER — MIDAZOLAM HCL 2 MG/2ML IJ SOLN
INTRAMUSCULAR | Status: AC
  Filled 2013-05-20: qty 2

## 2013-05-20 MED ORDER — PROPOFOL 10 MG/ML IV BOLUS
INTRAVENOUS | Status: DC | PRN
  Administered 2013-05-20: 150 mg via INTRAVENOUS

## 2013-05-20 MED ORDER — MIDAZOLAM HCL 5 MG/5ML IJ SOLN
INTRAMUSCULAR | Status: DC | PRN
  Administered 2013-05-20: 2 mg via INTRAVENOUS

## 2013-05-20 MED ORDER — SODIUM CHLORIDE 0.9 % IV SOLN: INTRAVENOUS | Status: DC

## 2013-05-20 MED ORDER — LACTATED RINGERS IV SOLN
INTRAVENOUS | Status: DC
  Administered 2013-05-20 (×2): via INTRAVENOUS

## 2013-05-20 MED ORDER — SODIUM CHLORIDE 0.9 % IR SOLN
Status: DC | PRN
  Administered 2013-05-20: 6000 mL

## 2013-05-20 MED ORDER — MIDAZOLAM HCL 2 MG/2ML IJ SOLN
1.0000 mg | INTRAMUSCULAR | Status: DC | PRN
  Administered 2013-05-20: 2 mg via INTRAVENOUS

## 2013-05-20 MED ORDER — OXYCODONE HCL 5 MG PO TABS: 5.0000 mg | ORAL_TABLET | Freq: Once | ORAL | Status: DC | PRN

## 2013-05-20 MED ORDER — LIDOCAINE HCL (CARDIAC) 20 MG/ML IV SOLN
INTRAVENOUS | Status: DC | PRN
  Administered 2013-05-20: 60 mg via INTRAVENOUS

## 2013-05-20 MED ORDER — BUPIVACAINE-EPINEPHRINE PF 0.5-1:200000 % IJ SOLN
INTRAMUSCULAR | Status: AC
  Filled 2013-05-20: qty 30

## 2013-05-20 SURGICAL SUPPLY — 47 items
BANDAGE ELASTIC 6 VELCRO ST LF (GAUZE/BANDAGES/DRESSINGS) ×1 IMPLANT
BANDAGE ESMARK 6X9 LF (GAUZE/BANDAGES/DRESSINGS) IMPLANT
BLADE 4.2CUDA (BLADE) ×1 IMPLANT
BLADE CUDA 5.5 (BLADE) IMPLANT
BLADE CUDA GRT WHITE 3.5 (BLADE) ×1 IMPLANT
BLADE CUDA SHAVER 3.5 (BLADE) IMPLANT
BLADE CUTTER MENIS 5.5 (BLADE) IMPLANT
BLADE GREAT WHITE 4.2 (BLADE) IMPLANT
BNDG CMPR 9X6 STRL LF SNTH (GAUZE/BANDAGES/DRESSINGS)
BNDG ESMARK 6X9 LF (GAUZE/BANDAGES/DRESSINGS)
BNDG GAUZE ELAST 4 BULKY (GAUZE/BANDAGES/DRESSINGS) ×2 IMPLANT
BRUSH SCRUB EZ PLAIN DRY (MISCELLANEOUS) ×1 IMPLANT
CANISTER SUCT 3000ML (MISCELLANEOUS) IMPLANT
CUTTER MENISCUS  4.2MM (BLADE)
CUTTER MENISCUS 4.2MM (BLADE) IMPLANT
DRAPE ARTHROSCOPY W/POUCH 114 (DRAPES) ×2 IMPLANT
DRSG EMULSION OIL 3X3 NADH (GAUZE/BANDAGES/DRESSINGS) ×2 IMPLANT
DURAPREP 26ML APPLICATOR (WOUND CARE) ×2 IMPLANT
GLOVE BIOGEL PI IND STRL 7.0 (GLOVE) IMPLANT
GLOVE BIOGEL PI IND STRL 8 (GLOVE) ×2 IMPLANT
GLOVE BIOGEL PI INDICATOR 7.0 (GLOVE) ×1
GLOVE BIOGEL PI INDICATOR 8 (GLOVE) ×1
GLOVE SURG ORTHO 8.0 STRL STRW (GLOVE) ×1 IMPLANT
GLOVE SURG SS PI 6.5 STRL IVOR (GLOVE) ×1 IMPLANT
GLOVE SURG SS PI 8.0 STRL IVOR (GLOVE) ×1 IMPLANT
GOWN STRL REUS W/ TWL LRG LVL3 (GOWN DISPOSABLE) ×1 IMPLANT
GOWN STRL REUS W/ TWL XL LVL3 (GOWN DISPOSABLE) ×1 IMPLANT
GOWN STRL REUS W/TWL LRG LVL3 (GOWN DISPOSABLE) ×2
GOWN STRL REUS W/TWL XL LVL3 (GOWN DISPOSABLE) ×2
HOLDER KNEE FOAM BLUE (MISCELLANEOUS) ×2 IMPLANT
KNEE WRAP E Z 3 GEL PACK (MISCELLANEOUS) ×1 IMPLANT
MANIFOLD NEPTUNE II (INSTRUMENTS) IMPLANT
NDL SAFETY ECLIPSE 18X1.5 (NEEDLE) ×1 IMPLANT
NEEDLE HYPO 18GX1.5 SHARP (NEEDLE) ×4
PACK ARTHROSCOPY DSU (CUSTOM PROCEDURE TRAY) ×2 IMPLANT
PACK BASIN DAY SURGERY FS (CUSTOM PROCEDURE TRAY) ×2 IMPLANT
SET ARTHROSCOPY TUBING (MISCELLANEOUS) ×2
SET ARTHROSCOPY TUBING LN (MISCELLANEOUS) ×1 IMPLANT
SPONGE GAUZE 4X4 12PLY (GAUZE/BANDAGES/DRESSINGS) ×2 IMPLANT
SUT ETHILON 4 0 PS 2 18 (SUTURE) ×2 IMPLANT
SYR 5ML LL (SYRINGE) ×2 IMPLANT
SYR CONTROL 10ML LL (SYRINGE) ×1 IMPLANT
TOWEL OR 17X24 6PK STRL BLUE (TOWEL DISPOSABLE) ×2 IMPLANT
WAND 3.0 CAPSURE 30 DEG W/CORD (SURGICAL WAND) IMPLANT
WAND 30 DEG SABER W/CORD (SURGICAL WAND) IMPLANT
WAND STAR VAC 90 (SURGICAL WAND) IMPLANT
WATER STERILE IRR 1000ML POUR (IV SOLUTION) ×2 IMPLANT

## 2013-05-20 NOTE — Anesthesia Procedure Notes (Addendum)
Anesthesia Regional Block:  Knee block  Pre-Anesthetic Checklist: ,, timeout performed, Correct Patient, Correct Site, Correct Laterality, Correct Procedure, Correct Position, site marked, Risks and benefits discussed, Surgical consent,  Pre-op evaluation,  Post-op pain management  Laterality: Left and Lower  Prep: chloraprep       Needles:   Needle Type: Other     Needle Length: 3cm  Needle Gauge: 22 and 22 G    Additional Needles: Knee block Narrative:  Start time: 05/20/2013 9:30 AM End time: 05/20/2013 9:45 AM Injection made incrementally with aspirations every 5 mL.  Performed by: Personally  Anesthesiologist: T Massagee  Additional Notes: Tolerated well   Procedure Name: LMA Insertion Date/Time: 05/20/2013 10:01 AM Performed by: Zacharius Funari Pre-anesthesia Checklist: Patient identified, Emergency Drugs available, Suction available and Patient being monitored Patient Re-evaluated:Patient Re-evaluated prior to inductionOxygen Delivery Method: Circle System Utilized Preoxygenation: Pre-oxygenation with 100% oxygen Intubation Type: IV induction Ventilation: Mask ventilation without difficulty LMA: LMA inserted LMA Size: 4.0 Number of attempts: 1 Airway Equipment and Method: bite block Placement Confirmation: positive ETCO2 Tube secured with: Tape Dental Injury: Teeth and Oropharynx as per pre-operative assessment     Procedure Name: LMA Insertion Date/Time: 05/20/2013 10:01 AM Performed by: Stellah Donovan Pre-anesthesia Checklist: Patient identified, Emergency Drugs available, Suction available and Patient being monitored Patient Re-evaluated:Patient Re-evaluated prior to inductionOxygen Delivery Method: Circle System Utilized Preoxygenation: Pre-oxygenation with 100% oxygen Intubation Type: IV induction Ventilation: Mask ventilation without difficulty LMA: LMA inserted LMA Size: 4.0 Number of attempts: 1 Airway Equipment and Method: bite  block Placement Confirmation: positive ETCO2 Tube secured with: Tape Dental Injury: Teeth and Oropharynx as per pre-operative assessment

## 2013-05-20 NOTE — Progress Notes (Signed)
Assisted Dr. Massagee with left, knee block. Side rails up, monitors on throughout procedure. See vital signs in flow sheet. Tolerated Procedure well. 

## 2013-05-20 NOTE — Anesthesia Preprocedure Evaluation (Addendum)
Anesthesia Evaluation  Patient identified by MRN, date of birth, ID band Patient awake    History of Anesthesia Complications Negative for: history of anesthetic complications  Airway Mallampati: I  Neck ROM: Full    Dental  (+) Teeth Intact   Pulmonary Current Smoker,  breath sounds clear to auscultation        Cardiovascular Rhythm:Regular Rate:Normal     Neuro/Psych    GI/Hepatic GERD-  ,  Endo/Other    Renal/GU      Musculoskeletal   Abdominal   Peds  Hematology   Anesthesia Other Findings   Reproductive/Obstetrics                          Anesthesia Physical Anesthesia Plan  ASA: II  Anesthesia Plan: General   Post-op Pain Management:    Induction: Intravenous  Airway Management Planned: LMA  Additional Equipment:   Intra-op Plan:   Post-operative Plan: Extubation in OR  Informed Consent: I have reviewed the patients History and Physical, chart, labs and discussed the procedure including the risks, benefits and alternatives for the proposed anesthesia with the patient or authorized representative who has indicated his/her understanding and acceptance.   Dental advisory given  Plan Discussed with: CRNA and Surgeon  Anesthesia Plan Comments:         Anesthesia Quick Evaluation

## 2013-05-20 NOTE — H&P (View-Only) (Signed)
Pre-visit discussion using our clinic review tool. No additional management support is needed unless otherwise documented below in the visit note.  5PM yesterday.  Accidentally cut her L hand on a barb wire fence.  ~1cm.  L palm.  Distally NV intact w/o ROM deficit.  Not bleeding now.  Cleaned with hot soapy water and neosporin last night.    Cough.  Chest congestion.  No fevers.  No sputum usually, occ clear sputum.  No ear pain or ST.  Some rhinorrhea, occ.  Not SOB.  Hasn't had to use her SABA.    Meds, vitals, and allergies reviewed.   ROS: See HPI.  Otherwise, noncontributory.  GEN: nad, alert and oriented HEENT: mucous membranes moist, tm w/o erythema, nasal exam w/o erythema, clear discharge noted,  OP with cobblestoning NECK: supple w/o LA CV: rrr.   PULM: ctab except for occ mild rhonchi B, no inc wob EXT: no edema SKIN: superficial L palm lesion, 1 cm, at distal 4th MC, distally NV intact.   No FB on inspection with magnification.  Wound doesn't gape with ROM of hand. Not bleeding.

## 2013-05-20 NOTE — Interval H&P Note (Signed)
History and Physical Interval Note:  05/20/2013 9:31 AM  [name]  has presented today for surgery, with the diagnosis of chondromalcia - patella, tear knee cartilage NOS  The various methods of treatment have been discussed with the patient and family. After consideration of risks, benefits and other options for treatment, the patient has consented to  Procedure(s): LEFT KNEE ARTHROSCOPY WITH DEBRIDEMENT/SHAVING (CHONDROPLASTY) AND MEDIAL MENISECTOMY (Left) as a surgical intervention .  The patient's history has been reviewed, patient examined, no change in status, stable for surgery.  I have reviewed the patient's chart and labs.  Questions were answered to the patient's satisfaction.     Lashuna Tamashiro JR,W D

## 2013-05-20 NOTE — Transfer of Care (Signed)
Immediate Anesthesia Transfer of Care Note  Patient: Ashlee Allen  Procedure(s) Performed: Procedure(s): LEFT KNEE ARTHROSCOPY WITH DEBRIDEMENT/SHAVING (CHONDROPLASTY) AND LATERAL AND MEDIAL MENISECTOMY, EXCISION OF PLICA (Left)  Patient Location: PACU  Anesthesia Type:GA combined with regional for post-op pain  Level of Consciousness: awake, alert , oriented and patient cooperative  Airway & Oxygen Therapy: Patient Spontanous Breathing and Patient connected to face mask oxygen  Post-op Assessment: Report given to PACU RN and Post -op Vital signs reviewed and stable  Post vital signs: Reviewed and stable  Complications: No apparent anesthesia complications

## 2013-05-20 NOTE — H&P (View-Only) (Signed)
Ashlee Allen is an 62 y.o. female.   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  Failed conservative treatment wanting to proceed with surgery.  Past Medical History  Diagnosis Date  . GERD (gastroesophageal reflux disease)     controllled with weight loss   . Discoid lupus     Dr. Burton (prev seen at [facility]) and Dr. Lomax  . Fibroid   . Depression   . Cardiac murmur     echo 8-9 yr ago-not sure where-no tx    Past Surgical History  Procedure Laterality Date  . Hand surgery  2001    ctr-both  . Inner ear surgery      left  . Cholecystectomy  1991  . Abdominal hysterectomy  1997    [facility]  [facility]  . Colonoscopy    . Breast surgery  2001    Duct excised-rt    Family History  Problem Relation Age of Onset  . Alcohol abuse Mother   . Hyperlipidemia Mother   . Hypertension Mother   . Diabetes Mother   . Alcohol abuse Father   . Cancer Father     lung  . Seizures Brother    Social History:  reports that she has been smoking.  She has never used smokeless tobacco. She reports that she does not drink alcohol or use illicit drugs.  Allergies:  Allergies  Allergen Reactions  . Varenicline Tartrate     REACTION: nightmares     (Not in a hospital admission)  No results found for this or any previous visit (from the past 48 hour(s)). No results found.  Review of Systems  Constitutional: Negative.   HENT: Positive for hearing loss. Negative for congestion, ear discharge, ear pain, nosebleeds, sore throat and tinnitus.   Eyes: Negative.   Respiratory: Negative.  Negative for stridor.   Cardiovascular: Negative.   Gastrointestinal: Negative.   Genitourinary: Negative.   Musculoskeletal: Positive for joint pain.  Skin: Positive for rash. Negative for itching.  Neurological: Negative.  Negative for headaches.  Endo/Heme/Allergies: Negative.    Psychiatric/Behavioral: Negative.     There were no vitals taken for this visit. Physical Exam  Constitutional: She is oriented to person, place, and time. She appears well-developed and well-nourished. No distress.  HENT:  Head: Normocephalic and atraumatic.  Nose: Nose normal.  Eyes: Conjunctivae and EOM are normal. Pupils are equal, round, and reactive to light.  Neck: Normal range of motion. Neck supple.  Cardiovascular: Normal rate, regular rhythm and intact distal pulses.   Respiratory: Effort normal. No respiratory distress.  GI: Soft. She exhibits no distension.  Musculoskeletal:       Left knee: She exhibits swelling and bony tenderness. She exhibits no effusion, no ecchymosis, no deformity, no laceration, no erythema, normal alignment, no LCL laxity and no MCL laxity. Tenderness found. Medial joint line and lateral joint line tenderness noted.  Neurological: She is alert and oriented to person, place, and time.  Skin: Skin is warm and dry. No erythema.  Psychiatric: She has a normal mood and affect. Her behavior is normal.     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  This will be done as outpatient procedure general anesthesia.    Ashlee Allen 05/17/2013, 1:31 PM    

## 2013-05-20 NOTE — Interval H&P Note (Signed)
History and Physical Interval Note:  05/20/2013 9:31 AM  Ashlee Allen  has presented today for surgery, with the diagnosis of chondromalcia - patella, tear knee cartilage NOS  The various methods of treatment have been discussed with the patient and family. After consideration of risks, benefits and other options for treatment, the patient has consented to  Procedure(s): LEFT KNEE ARTHROSCOPY WITH DEBRIDEMENT/SHAVING (CHONDROPLASTY) AND MEDIAL MENISECTOMY (Left) as a surgical intervention .  The patient's history has been reviewed, patient examined, no change in status, stable for surgery.  I have reviewed the patient's chart and labs.  Questions were answered to the patient's satisfaction.     Calib Wadhwa JR,W D   

## 2013-05-20 NOTE — Anesthesia Postprocedure Evaluation (Signed)
  Anesthesia Post-op Note  Patient: Ashlee Allen  Procedure(s) Performed: Procedure(s): LEFT KNEE ARTHROSCOPY WITH DEBRIDEMENT/SHAVING (CHONDROPLASTY) AND LATERAL AND MEDIAL MENISECTOMY, EXCISION OF PLICA (Left)  Patient Location: PACU  Anesthesia Type:General  Level of Consciousness: awake and alert   Airway and Oxygen Therapy: Patient Spontanous Breathing  Post-op Pain: mild  Post-op Assessment: Post-op Vital signs reviewed  Post-op Vital Signs: stable  Complications: No apparent anesthesia complications

## 2013-05-20 NOTE — Discharge Instructions (Signed)
Diet: As you were doing prior to hospitalization  Activity:  Increase activity slowly as tolerated                  No lifting or driving for 2 weeks  Shower:  May shower without a dressing Sunday, NO SOAKING in tub.  Dressing:  You may change your dressing on Sunday                     Weight Bearing:   weight bearing as tolerated.  Use a walker or                    Crutches as needed.  To prevent constipation: you may use a stool softener such as -               Colace ( over the counter) 100 mg by mouth twice a day                Drink plenty of fluids ( prune juice may be helpful) and high fiber foods                Miralax ( over the counter) for constipation as needed.    Precautions:  If you experience chest pain or shortness of breath - call 911 immediately               For transfer to the hospital emergency department!!               If you develop a fever greater that 101 F, purulent drainage from wound,                             increased redness or drainage from wound, or calf pain -- Call the office  Follow- Up Appointment:  Please call for an appointment to be seen in 1 week               [address] - [phone]    Post Anesthesia Home Care Instructions  Activity: Get plenty of rest for the remainder of the day. A responsible adult should stay with you for 24 hours following the procedure.  For the next 24 hours, DO NOT: -Drive a car -Paediatric nurse -Drink alcoholic beverages -Take any medication unless instructed by your physician -Make any legal decisions or sign important papers.  Meals: Start with liquid foods such as gelatin or soup. Progress to regular foods as tolerated. Avoid greasy, spicy, heavy foods. If nausea and/or vomiting occur, drink only clear liquids until the nausea and/or vomiting subsides. Call your physician if vomiting continues.  Special Instructions/Symptoms: Your throat may feel dry or sore from the anesthesia or the breathing  tube placed in your throat during surgery. If this causes discomfort, gargle with warm salt water. The discomfort should disappear within 24 hours.   Regional Anesthesia Blocks  1. Numbness or the inability to move the "blocked" extremity may last from 3-48 hours after placement. The length of time depends on the medication injected and your individual response to the medication. If the numbness is not going away after 48 hours, call your surgeon.  2. The extremity that is blocked will need to be protected until the numbness is gone and the  Strength has returned. Because you cannot feel it, you will need to take extra care to avoid injury. Because it may be weak, you may have difficulty moving it or  using it. You may not know what position it is in without looking at it while the block is in effect.  3. For blocks in the legs and feet, returning to weight bearing and walking needs to be done carefully. You will need to wait until the numbness is entirely gone and the strength has returned. You should be able to move your leg and foot normally before you try and bear weight or walk. You will need someone to be with you when you first try to ensure you do not fall and possibly risk injury.  4. Bruising and tenderness at the needle site are common side effects and will resolve in a few days.  5. Persistent numbness or new problems with movement should be communicated to the surgeon or the [facility] [phone] [place] [phone]).

## 2013-05-23 ENCOUNTER — Encounter (HOSPITAL_BASED_OUTPATIENT_CLINIC_OR_DEPARTMENT_OTHER): Payer: Self-pay | Admitting: Orthopedic Surgery

## 2013-05-23 NOTE — Op Note (Signed)
NAMEGAZELLA, ANGLIN NO.:  [id number]  MEDICAL RECORD NO.:  [id number]  LOCATION:                                 FACILITY:  PHYSICIAN:  Lockie Pares, M.D.    DATE OF BIRTH:  1950-07-31  DATE OF PROCEDURE:  05/20/2013 DATE OF DISCHARGE:  05/20/2013                              OPERATIVE REPORT   INDICATIONS:  Intractable knee pain.  MRI proven medial and lateral meniscus tear in left knee thought to be amenable by outpatient surgery.  PREOPERATIVE DIAGNOSES: 1. Torn medial and lateral menisci, left knee. 2. Tricompartmental osteoarthritis. 3. Excision plica.  POSTOPERATIVE DIAGNOSES: 1. Torn medial and lateral menisci, left knee. 2. Tricompartmental osteoarthritis. 3. Excision plica.  OPERATION: 1. Medial and lateral meniscectomies. 2. Tricompartmental debridement chondroplasty. 3. Plica excision.  SURGEON:  Lockie Pares, M.D.  ANESTHESIA:  MAC, local supplementation.  DESCRIPTION OF PROCEDURE:  Inferomedial and inferolateral portals inspected.  Systemic inspection of the knee showed she had a very thick medial shelf that was resected and grade 3 chondromalacia on the medial facet of the patella which was debrided.  Trochlear groove relatively spared.  Extensive debridement __________ patellofemoral joint.  Grade 3 changes more advanced on the lateral side of the knee on the tibia and then on the tibia on the medial side small frame type tear of the medial meniscus requiring resection of 10% to 50% meniscus substance, more complex tear of the posterior horn, mid portion of the lateral meniscus requiring resection of 20% to 30% __________ meniscus resection.  We did not have to violate the popliteal hiatus though it was somewhat large. Chondroplasty carried out and knee drained free of fluid.  Portals were closed with nylon and infiltrated with Marcaine in the portals 0.5% 10 mL distal, 5 mL  40 mg Depo-Medrol on the joint.  Light  compressive sterile dressing applied.  Taken to recovery room in stable condition.     Lockie Pares, M.D.     WDC/MEDQ  D:  05/20/2013  T:  05/20/2013  Job:  [id number]

## 2013-06-19 ENCOUNTER — Emergency Department (HOSPITAL_BASED_OUTPATIENT_CLINIC_OR_DEPARTMENT_OTHER)
Admission: EM | Admit: 2013-06-19 | Discharge: 2013-06-19 | Disposition: A | Discharge status: Home / Self Care | Payer: BC Managed Care – PPO | Attending: Emergency Medicine | Admitting: Emergency Medicine

## 2013-06-19 ENCOUNTER — Encounter (HOSPITAL_BASED_OUTPATIENT_CLINIC_OR_DEPARTMENT_OTHER): Payer: Self-pay | Admitting: Emergency Medicine

## 2013-06-19 DIAGNOSIS — Z79899 Other long term (current) drug therapy: Secondary | ICD-10-CM | POA: Insufficient documentation

## 2013-06-19 DIAGNOSIS — R011 Cardiac murmur, unspecified: Secondary | ICD-10-CM | POA: Insufficient documentation

## 2013-06-19 DIAGNOSIS — Z8659 Personal history of other mental and behavioral disorders: Secondary | ICD-10-CM | POA: Insufficient documentation

## 2013-06-19 DIAGNOSIS — R112 Nausea with vomiting, unspecified: Secondary | ICD-10-CM

## 2013-06-19 DIAGNOSIS — K219 Gastro-esophageal reflux disease without esophagitis: Secondary | ICD-10-CM | POA: Insufficient documentation

## 2013-06-19 DIAGNOSIS — R197 Diarrhea, unspecified: Secondary | ICD-10-CM | POA: Insufficient documentation

## 2013-06-19 DIAGNOSIS — Z91040 Latex allergy status: Secondary | ICD-10-CM | POA: Insufficient documentation

## 2013-06-19 DIAGNOSIS — Z872 Personal history of diseases of the skin and subcutaneous tissue: Secondary | ICD-10-CM | POA: Insufficient documentation

## 2013-06-19 DIAGNOSIS — F17200 Nicotine dependence, unspecified, uncomplicated: Secondary | ICD-10-CM | POA: Insufficient documentation

## 2013-06-19 LAB — BASIC METABOLIC PANEL
BUN: 17 mg/dL (ref 6–23)
CHLORIDE: 101 meq/L (ref 96–112)
CO2: 22 mEq/L (ref 19–32)
CREATININE: 0.8 mg/dL (ref 0.50–1.10)
Calcium: 9.4 mg/dL (ref 8.4–10.5)
GFR calc non Af Amer: 77 mL/min — ABNORMAL LOW (ref >90)
GFR, EST AFRICAN AMERICAN: 89 mL/min — AB (ref >90)
Glucose, Bld: 114 mg/dL — ABNORMAL HIGH (ref 70–99)
Potassium: 4.1 mEq/L (ref 3.7–5.3)
Sodium: 137 mEq/L (ref 137–147)

## 2013-06-19 MED ORDER — SODIUM CHLORIDE 0.9 % IV BOLUS (SEPSIS)
500.0000 mL | Freq: Once | INTRAVENOUS | Status: AC
  Administered 2013-06-19: 500 mL via INTRAVENOUS

## 2013-06-19 MED ORDER — ONDANSETRON 8 MG PO TBDP: ORAL_TABLET | ORAL | Status: DC

## 2013-06-19 MED ORDER — ONDANSETRON HCL 4 MG/2ML IJ SOLN
4.0000 mg | Freq: Once | INTRAMUSCULAR | Status: AC
  Administered 2013-06-19: 4 mg via INTRAVENOUS
  Filled 2013-06-19: qty 2

## 2013-06-19 NOTE — ED Provider Notes (Signed)
CSN: [id number]     Arrival date & time 06/19/13  0555 History   First MD Initiated Contact with Patient 06/19/13 0604     Chief Complaint  Patient presents with  . Emesis     (Consider location/radiation/quality/duration/timing/severity/associated sxs/prior Treatment) Patient is a 63 y.o. female presenting with vomiting. The history is provided by the patient.  Emesis Severity:  Moderate Timing:  Intermittent Quality:  Stomach contents Progression:  Unchanged Chronicity:  New Recent urination:  Normal Context: not post-tussive   Relieved by:  Nothing Worsened by:  Nothing tried Ineffective treatments:  None tried Associated symptoms: diarrhea   Associated symptoms: no abdominal pain   Diarrhea:    Quality:  Watery   Severity:  Moderate   Timing:  Intermittent   Progression:  Unchanged Risk factors: sick contacts   Risk factors comment:  Multiple family members who ate together on Easter who now have same   Past Medical History  Diagnosis Date  . GERD (gastroesophageal reflux disease)     controllled with weight loss   . Discoid lupus     Dr. Kalman Shan (prev seen at [facility]) and Dr. Ubaldo Glassing  . Fibroid   . Depression   . Cardiac murmur     echo 8-9 yr ago-not sure where-no tx   Past Surgical History  Procedure Laterality Date  . Hand surgery  2001    ctr-both  . Inner ear surgery      left  . Cholecystectomy  1991  . Abdominal hysterectomy  1997    [facility]  [facility]  . Colonoscopy    . Breast surgery  2001    Duct excised-rt  . Knee arthroscopy Left 05/20/2013    Procedure: LEFT KNEE ARTHROSCOPY WITH DEBRIDEMENT/SHAVING (CHONDROPLASTY) AND LATERAL AND MEDIAL MENISECTOMY, EXCISION OF PLICA;  Surgeon: Yvette Rack., MD;  Location: [facility];  Service: Orthopedics;  Laterality: Left;   Family History  Problem Relation Age of Onset  . Alcohol abuse Mother   . Hyperlipidemia Mother   . Hypertension Mother   . Diabetes Mother   . Alcohol abuse Father   .  Cancer Father     lung  . Seizures Brother    History  Substance Use Topics  . Smoking status: Current Every Day Smoker -- 1.00 packs/day  . Smokeless tobacco: Never Used  . Alcohol Use: No   OB History   Grav Para Term Preterm Abortions TAB SAB Ect Mult Living   3 2 2  1     2      Review of Systems  Constitutional: Negative for fever.  Gastrointestinal: Positive for vomiting and diarrhea. Negative for abdominal pain.  All other systems reviewed and are negative.     Allergies  Latex and Varenicline tartrate  Home Medications   Current Outpatient Rx  Name  Route  Sig  Dispense  Refill  . hydroxychloroquine (PLAQUENIL) 200 MG tablet   Oral   Take 200 mg by mouth 2 (two) times daily.          Marland Kitchen EXPIRED: albuterol (PROAIR HFA) 108 (90 BASE) MCG/ACT inhaler   Inhalation   Inhale 2 puffs into the lungs every 6 (six) hours as needed for wheezing.   18 g   1    BP 130/66  Pulse 115  Temp(Src) 97.7 F (36.5 C) (Oral)  Resp 21  Ht 5\' 1"  (1.549 m)  Wt 159 lb (72.122 kg)  BMI 30.06 kg/m2  SpO2 100% Physical Exam  Constitutional: She is oriented to person, place, and time. She appears well-developed and well-nourished. No distress.  HENT:  Head: Normocephalic and atraumatic.  Mouth/Throat: Oropharynx is clear and moist.  Eyes: Conjunctivae and EOM are normal. Pupils are equal, round, and reactive to light.  Neck: Normal range of motion. Neck supple.  Cardiovascular: Normal rate, regular rhythm and intact distal pulses.   Pulmonary/Chest: Effort normal and breath sounds normal. No respiratory distress. She has no wheezes. She has no rales.  Abdominal: Soft. Bowel sounds are normal. There is no tenderness. There is no rebound and no guarding.  Musculoskeletal: Normal range of motion. She exhibits no edema.  Neurological: She is alert and oriented to person, place, and time.  Skin: Skin is warm and dry.  Psychiatric: She has a normal mood and affect.    ED Course   Procedures (including critical care time) Labs Review Labs Reviewed  BASIC METABOLIC PANEL  URINALYSIS, ROUTINE W REFLEX MICROSCOPIC   Imaging Review No results found.   EKG Interpretation None      MDM   Final diagnoses:  None  viral n/v/d other family members with same.  Exam and vitals reassuring and benign.  No indication for imaging at this time.  Hand hygiene discussed and bland diet along with anti emetics and pro biotics.    Carlisle Beers, MD 06/19/13 [phone]

## 2013-06-19 NOTE — ED Notes (Signed)
Patient sipping ginger ale  

## 2013-06-19 NOTE — ED Notes (Signed)
Pt reports diarrhea that started last Monday - states vomiting started yesterday. Reports she thinks she has bronchitis as well.

## 2013-06-20 ENCOUNTER — Telehealth: Payer: Self-pay

## 2013-06-20 NOTE — Telephone Encounter (Signed)
Louie Casa pts husband said pt was seen at ED on 06/19/13 with dehydration due to N&V&diarrhea. Pt was given IV fluid and sent home with med for nausea. Louie Casa is concerned pt still has fever 100 after taking Tylenol. Pt still has diarrhea and pt is trying to drink but pt is not able to drink very much and pt has only urinated small amt. Louie Casa said pt is slightly better but should pt be rechecked or get more fluids. Randy request cb.

## 2013-06-20 NOTE — Telephone Encounter (Signed)
Left message on patient's voicemail to return call

## 2013-06-20 NOTE — Telephone Encounter (Signed)
Husband says that the N & V has pretty much stopped but the diarrhea is relentless.  Even when she drinks a sip of gatorade, it comes straight through her.  He is very concerned about dehydration if she can't get the diarrhea stopped and they are trying to avoid going back to the hospital again.  I asked if she was taking any Imodium and he said "No".  The hospital gave her fluids and a Rx for Zofran but didn't mention anything about Imodium.  Husband is asking if you can call in a Rx for the diarrhea?

## 2013-06-20 NOTE — Telephone Encounter (Signed)
Left detailed message on voicemail.  

## 2013-06-20 NOTE — Telephone Encounter (Signed)
It's not that I don't want to see her, but I wouldn't have her put the effort into coming here since we can't do IV fluids.  If CP, SOB, no urine output, profoundly lightheaded, then to ER.  If able to take sips of fluids then would continue as is at home.  I would suspect that she'll do okay with PO fluids at home.  The fever isn't unexpected.  As long as she isn't clinically worsening, then okay to keep treating at home.   Thanks.

## 2013-06-20 NOTE — Telephone Encounter (Signed)
The diarrhea is usually the last thing to resolve- it usually gets better "from the top down."  If she can stay ahead of it with oral fluids, she should be able to ride it out.  I wouldn't take medicine to slow down the BMs, as she likely needs to eliminate that material.

## 2013-06-30 ENCOUNTER — Ambulatory Visit (INDEPENDENT_AMBULATORY_CARE_PROVIDER_SITE_OTHER): Payer: BC Managed Care – PPO | Admitting: Family Medicine

## 2013-06-30 ENCOUNTER — Encounter: Payer: Self-pay | Admitting: Family Medicine

## 2013-06-30 VITALS — BP 112/86 | HR 84 | Temp 97.9°F | Wt 162.0 lb

## 2013-06-30 DIAGNOSIS — R609 Edema, unspecified: Secondary | ICD-10-CM

## 2013-06-30 LAB — BASIC METABOLIC PANEL
BUN: 12 mg/dL (ref 6–23)
CO2: 29 meq/L (ref 19–32)
CREATININE: 0.8 mg/dL (ref 0.4–1.2)
Calcium: 9.5 mg/dL (ref 8.4–10.5)
Chloride: 100 mEq/L (ref 96–112)
GFR: 79.09 mL/min (ref >60.00)
Glucose, Bld: 82 mg/dL (ref 70–99)
Potassium: 4.2 mEq/L (ref 3.5–5.1)
Sodium: 139 mEq/L (ref 135–145)

## 2013-06-30 NOTE — Progress Notes (Signed)
Pre visit review using our clinic review tool, if applicable. No additional management support is needed unless otherwise documented below in the visit note.  4/11th, had some diarrhea, then vomiting. Was seen at ER.  Given meds for nausea and IVF.  Diarrhea continued until 4/14th.  Temp max up to 102.9 prev.  Temp 99.1 at home this AM.  Now with a few days of edema in the BLE.  Had some fever blisters recently.  She may have had a salt load recently.  Her UOP has picked up.    Meds, vitals, and allergies reviewed.   ROS: See HPI.  Otherwise, noncontributory.  GEN: nad, alert and oriented HEENT: mucous membranes moist NECK: supple w/o LA CV: rrr.  PULM: ctab, no inc wob ABD: soft, +bs EXT: trace edema SKIN: no acute rash

## 2013-06-30 NOTE — Patient Instructions (Signed)
Go to the lab on the way out.  We'll contact you with your lab report. Take care, keep drinking plenty of fluids.

## 2013-07-01 ENCOUNTER — Telehealth: Payer: Self-pay | Admitting: Family Medicine

## 2013-07-01 DIAGNOSIS — R609 Edema, unspecified: Secondary | ICD-10-CM | POA: Insufficient documentation

## 2013-07-01 NOTE — Telephone Encounter (Signed)
Relevant patient education assigned to patient using Emmi. ° °

## 2013-07-01 NOTE — Assessment & Plan Note (Signed)
Likely will resolve in the near future, likely related to fluid shifts after prev GI illness.  Ctab. Okay for outpatient f/u.  She agrees. See notes on labs.

## 2013-11-04 ENCOUNTER — Encounter: Payer: Self-pay | Admitting: Gynecology

## 2013-11-08 ENCOUNTER — Ambulatory Visit (INDEPENDENT_AMBULATORY_CARE_PROVIDER_SITE_OTHER): Payer: BC Managed Care – PPO | Admitting: Gynecology

## 2013-11-08 ENCOUNTER — Encounter: Payer: Self-pay | Admitting: Gynecology

## 2013-11-08 VITALS — BP 120/70 | Ht 65.0 in | Wt 163.0 lb

## 2013-11-08 DIAGNOSIS — N951 Menopausal and female climacteric states: Secondary | ICD-10-CM

## 2013-11-08 DIAGNOSIS — Z01419 Encounter for gynecological examination (general) (routine) without abnormal findings: Secondary | ICD-10-CM

## 2013-11-08 DIAGNOSIS — Z780 Asymptomatic menopausal state: Secondary | ICD-10-CM

## 2013-11-08 DIAGNOSIS — R635 Abnormal weight gain: Secondary | ICD-10-CM

## 2013-11-08 DIAGNOSIS — F17200 Nicotine dependence, unspecified, uncomplicated: Secondary | ICD-10-CM

## 2013-11-08 NOTE — Patient Instructions (Signed)
Smoking Cessation Quitting smoking is important to your health and has many advantages. However, it is not always easy to quit since nicotine is a very addictive drug. Oftentimes, people try 3 times or more before being able to quit. This document explains the best ways for you to prepare to quit smoking. Quitting takes hard work and a lot of effort, but you can do it. ADVANTAGES OF QUITTING SMOKING  You will live longer, feel better, and live better.  Your body will feel the impact of quitting smoking almost immediately.  Within 20 minutes, blood pressure decreases. Your pulse returns to its normal level.  After 8 hours, carbon monoxide levels in the blood return to normal. Your oxygen level increases.  After 24 hours, the chance of having a heart attack starts to decrease. Your breath, hair, and body stop smelling like smoke.  After 48 hours, damaged nerve endings begin to recover. Your sense of taste and smell improve.  After 72 hours, the body is virtually free of nicotine. Your bronchial tubes relax and breathing becomes easier.  After 2 to 12 weeks, lungs can hold more air. Exercise becomes easier and circulation improves.  The risk of having a heart attack, stroke, cancer, or lung disease is greatly reduced.  After 1 year, the risk of coronary heart disease is cut in half.  After 5 years, the risk of stroke falls to the same as a nonsmoker.  After 10 years, the risk of lung cancer is cut in half and the risk of other cancers decreases significantly.  After 15 years, the risk of coronary heart disease drops, usually to the level of a nonsmoker.  If you are pregnant, quitting smoking will improve your chances of having a healthy baby.  The people you live with, especially any children, will be healthier.  You will have extra money to spend on things other than cigarettes. QUESTIONS TO THINK ABOUT BEFORE ATTEMPTING TO QUIT You may want to talk about your answers with your  health care provider.  Why do you want to quit?  If you tried to quit in the past, what helped and what did not?  What will be the most difficult situations for you after you quit? How will you plan to handle them?  Who can help you through the tough times? Your family? Friends? A health care provider?  What pleasures do you get from smoking? What ways can you still get pleasure if you quit? Here are some questions to ask your health care provider:  How can you help me to be successful at quitting?  What medicine do you think would be best for me and how should I take it?  What should I do if I need more help?  What is smoking withdrawal like? How can I get information on withdrawal? GET READY  Set a quit date.  Change your environment by getting rid of all cigarettes, ashtrays, matches, and lighters in your home, car, or work. Do not let people smoke in your home.  Review your past attempts to quit. Think about what worked and what did not. GET SUPPORT AND ENCOURAGEMENT You have a better chance of being successful if you have help. You can get support in many ways.  Tell your family, friends, and coworkers that you are going to quit and need their support. Ask them not to smoke around you.  Get individual, group, or telephone counseling and support. Programs are available at local hospitals and health centers. Call   your local health department for information about programs in your area.  Spiritual beliefs and practices may help some smokers quit.  Download a "quit meter" on your computer to keep track of quit statistics, such as how long you have gone without smoking, cigarettes not smoked, and money saved.  Get a self-help book about quitting smoking and staying off tobacco. LEARN NEW SKILLS AND BEHAVIORS  Distract yourself from urges to smoke. Talk to someone, go for a walk, or occupy your time with a task.  Change your normal routine. Take a different route to work.  Drink tea instead of coffee. Eat breakfast in a different place.  Reduce your stress. Take a hot bath, exercise, or read a book.  Plan something enjoyable to do every day. Reward yourself for not smoking.  Explore interactive web-based programs that specialize in helping you quit. GET MEDICINE AND USE IT CORRECTLY Medicines can help you stop smoking and decrease the urge to smoke. Combining medicine with the above behavioral methods and support can greatly increase your chances of successfully quitting smoking.  Nicotine replacement therapy helps deliver nicotine to your body without the negative effects and risks of smoking. Nicotine replacement therapy includes nicotine gum, lozenges, inhalers, nasal sprays, and skin patches. Some may be available over-the-counter and others require a prescription.  Antidepressant medicine helps people abstain from smoking, but how this works is unknown. This medicine is available by prescription.  Nicotinic receptor partial agonist medicine simulates the effect of nicotine in your brain. This medicine is available by prescription. Ask your health care provider for advice about which medicines to use and how to use them based on your health history. Your health care provider will tell you what side effects to look out for if you choose to be on a medicine or therapy. Carefully read the information on the package. Do not use any other product containing nicotine while using a nicotine replacement product.  RELAPSE OR DIFFICULT SITUATIONS Most relapses occur within the first 3 months after quitting. Do not be discouraged if you start smoking again. Remember, most people try several times before finally quitting. You may have symptoms of withdrawal because your body is used to nicotine. You may crave cigarettes, be irritable, feel very hungry, cough often, get headaches, or have difficulty concentrating. The withdrawal symptoms are only temporary. They are strongest  when you first quit, but they will go away within 10-14 days. To reduce the chances of relapse, try to:  Avoid drinking alcohol. Drinking lowers your chances of successfully quitting.  Reduce the amount of caffeine you consume. Once you quit smoking, the amount of caffeine in your body increases and can give you symptoms, such as a rapid heartbeat, sweating, and anxiety.  Avoid smokers because they can make you want to smoke.  Do not let weight gain distract you. Many smokers will gain weight when they quit, usually less than 10 pounds. Eat a healthy diet and stay active. You can always lose the weight gained after you quit.  Find ways to improve your mood other than smoking. FOR MORE INFORMATION  www.smokefree.gov  Document Released: 02/18/2001 Document Revised: 07/11/2013 Document Reviewed: 06/05/2011 [employer] Patient Information 2015 [employer]. This information is not intended to replace advice given to you by your health care provider. Make sure you discuss any questions you have with your health care provider.  

## 2013-11-08 NOTE — Progress Notes (Signed)
Ashlee Allen 1950-06-10 [id number]   History:    63 y.o.  for annual gyn exam with no complaints today.Review of patient's records indicated that she had a total abdominal hysterectomy for fibroids along with a Burch bladder suspension back in 1997. She was on vaginal estrogen twice a week and is no longer on it. She states that occasionally she still will leak urine when coughing or straining and she does wear a pad. She is a chronic smoker for many years and currently smokes one half packs per day and tried Chantix in the past it made her sick. Her bone density study was normal in 2014. Patient does have past history of colon polyps. Her last colonoscopy was reportedly normal in 2014. Dr. Damita Dunnings is her PCP who has been doing her blood work. Dr. 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 and doing blood work as well. Patient denied any past history of abnormal Pap smears. She's currently not taking any calcium or vitamin D.   Past medical history,surgical history, family history and social history were all reviewed and documented in the EPIC chart.  Gynecologic History No LMP recorded. Patient has had a hysterectomy. Contraception: status post hysterectomy Last Pap: 2012. Results were: normal Last mammogram: 2015. Results were: normal  Obstetric History OB History  Gravida Para Term Preterm AB SAB TAB Ectopic Multiple Living  3 2 2  1     2     # Outcome Date GA Lbr Len/2nd Weight Sex Delivery Anes PTL Lv  3 ABT           2 TRM           1 TRM                ROS: A ROS was performed and pertinent positives and negatives are included in the history.  GENERAL: No fevers or chills. HEENT: No change in vision, no earache, sore throat or sinus congestion. NECK: No pain or stiffness. CARDIOVASCULAR: No chest pain or pressure. No palpitations. PULMONARY: No shortness of breath, cough or wheeze. GASTROINTESTINAL: No abdominal pain,  nausea, vomiting or diarrhea, melena or bright red blood per rectum. GENITOURINARY: No urinary frequency, urgency, hesitancy or dysuria. MUSCULOSKELETAL: No joint or muscle pain, no back pain, no recent trauma. DERMATOLOGIC: No rash, no itching, no lesions. ENDOCRINE: No polyuria, polydipsia, no heat or cold intolerance. No recent change in weight. HEMATOLOGICAL: No anemia or easy bruising or bleeding. NEUROLOGIC: No headache, seizures, numbness, tingling or weakness. PSYCHIATRIC: No depression, no loss of interest in normal activity or change in sleep pattern.     Exam: chaperone present  BP 120/70  Ht 5\' 5"  (1.651 m)  Wt 163 lb (73.936 kg)  BMI 27.12 kg/m2  Body mass index is 27.12 kg/(m^2).  General appearance : Well developed well nourished female. No acute distress HEENT: Neck supple, trachea midline, no carotid bruits, no thyroidmegaly Lungs: Clear to auscultation, no rhonchi or wheezes, or rib retractions  Heart: Regular rate and rhythm, no murmurs or gallops Breast:Examined in sitting and supine position were symmetrical in appearance, no palpable masses or tenderness,  no skin retraction, no nipple inversion, no nipple discharge, no skin discoloration, no axillary or supraclavicular lymphadenopathy Abdomen: no palpable masses or tenderness, no rebound or guarding Extremities: no edema or skin discoloration or tenderness  Pelvic:  Bartholin, Urethra, Skene Glands: Within normal limits  Vagina: No gross lesions or discharge, vaginal atrophy  Cervix: Absent  Uterus absent  Adnexa  Without masses or tenderness  Anus and perineum  normal   Rectovaginal  normal sphincter tone without palpated masses or tenderness             Hemoccult will provide     Assessment/Plan:  63 y.o. female fpostmenopausal no longer on vaginal estrogen. Mild stress urinary incontinence. Patient with history of mild urinary incontinence had previously been provided literature information on  Kegel exercises. We once again discussed the detrimental effects of smoking. Last year she had a normal chest x-ray PA and lateral. She was given the fecal Hemoccult cards to submit to the office which she did not last year. We discussed the importance of calcium and vitamin D and regular exercise for osteoporosis prevention. Last year she had a negative hepatitis  C screen as recommended by the CDC. Pap smear not done today.  Note: This dictation was prepared with  Dragon/digital dictation along withSmart phrase technology. Any transcriptional errors that result from this process are unintentional.   Terrance Mass MD, 9:57 AM 11/08/2013

## 2013-11-17 ENCOUNTER — Encounter: Payer: BC Managed Care – PPO | Admitting: Gynecology

## 2014-01-09 ENCOUNTER — Encounter: Payer: Self-pay | Admitting: Gynecology

## 2014-11-09 LAB — HM PAP SMEAR

## 2014-11-22 ENCOUNTER — Encounter: Payer: Self-pay | Admitting: Gynecology

## 2014-11-24 ENCOUNTER — Ambulatory Visit (INDEPENDENT_AMBULATORY_CARE_PROVIDER_SITE_OTHER): Payer: Medicare Other | Admitting: Gynecology

## 2014-11-24 ENCOUNTER — Encounter: Payer: Self-pay | Admitting: Gynecology

## 2014-11-24 VITALS — BP 116/64 | Ht 65.5 in | Wt 159.0 lb

## 2014-11-24 DIAGNOSIS — Z86010 Personal history of colonic polyps: Secondary | ICD-10-CM

## 2014-11-24 DIAGNOSIS — Z780 Asymptomatic menopausal state: Secondary | ICD-10-CM

## 2014-11-24 DIAGNOSIS — Z01419 Encounter for gynecological examination (general) (routine) without abnormal findings: Secondary | ICD-10-CM | POA: Diagnosis not present

## 2014-11-24 NOTE — Patient Instructions (Signed)

## 2014-11-24 NOTE — Progress Notes (Signed)
Ashlee Allen Jun 09, 1950 [id number]   History:    64 y.o.  for annual gyn exam with no complaints today.Review of patient's records indicated that she had a total abdominal hysterectomy for fibroids along with a Burch bladder suspension back in 1997. She was on vaginal estrogen twice a week and is no longer on it. She states that occasionally she still will leak urine when coughing or straining and she does wear a pad. She is a chronic smoker for many years and currently smokes one half packs per day and tried Chantix in the past it made her sick. Her bone density study was normal in 2014. Patient does have past history of colon polyps. Her last colonoscopy was reportedly normal in 2014. Dr. Damita Dunnings is her PCP who has been doing her blood work. Dr. 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  Patient denied any past history of abnormal Pap smears. She's currently not taking any calcium or vitamin D. Patient's last bone density study 2014 was normal  Past medical history,surgical history, family history and social history were all reviewed and documented in the EPIC chart.  Gynecologic History No LMP recorded. Patient has had a hysterectomy. Contraception: post menopausal status Last Pap: Several years ago. Results were: normal Last mammogram: 2016. Results were: normal  Obstetric History OB History  Gravida Para Term Preterm AB SAB TAB Ectopic Multiple Living  3 2 2  1     2     # Outcome Date GA Lbr Len/2nd Weight Sex Delivery Anes PTL Lv  3 AB           2 Term           1 Term                ROS: A ROS was performed and pertinent positives and negatives are included in the history.  GENERAL: No fevers or chills. HEENT: No change in vision, no earache, sore throat or sinus congestion. NECK: No pain or stiffness. CARDIOVASCULAR: No chest pain or pressure. No palpitations. PULMONARY: No shortness of breath, cough or wheeze.  GASTROINTESTINAL: No abdominal pain, nausea, vomiting or diarrhea, melena or bright red blood per rectum. GENITOURINARY: No urinary frequency, urgency, hesitancy or dysuria. MUSCULOSKELETAL: No joint or muscle pain, no back pain, no recent trauma. DERMATOLOGIC: No rash, no itching, no lesions. ENDOCRINE: No polyuria, polydipsia, no heat or cold intolerance. No recent change in weight. HEMATOLOGICAL: No anemia or easy bruising or bleeding. NEUROLOGIC: No headache, seizures, numbness, tingling or weakness. PSYCHIATRIC: No depression, no loss of interest in normal activity or change in sleep pattern.     Exam: chaperone present  BP 116/64 mmHg  Ht 5' 5.5" (1.664 m)  Wt 159 lb (72.122 kg)  BMI 26.05 kg/m2  Body mass index is 26.05 kg/(m^2).  General appearance : Well developed well nourished female. No acute distress HEENT: Eyes: no retinal hemorrhage or exudates,  Neck supple, trachea midline, no carotid bruits, no thyroidmegaly Lungs: Clear to auscultation, no rhonchi or wheezes, or rib retractions  Heart: Regular rate and rhythm, no murmurs or gallops Breast:Examined in sitting and supine position were symmetrical in appearance, no palpable masses or tenderness,  no skin retraction, no nipple inversion, no nipple discharge, no skin discoloration, no axillary or supraclavicular lymphadenopathy Abdomen: no palpable masses or tenderness, no rebound or guarding Extremities: no edema or skin discoloration or tenderness  Pelvic:  Bartholin, Urethra, Skene Glands:  Within normal limits             Vagina: No gross lesions or discharge, atrophic changes  Cervix: No gross lesions or discharge  Uterus  axial, normal size, shape and consistency, non-tender and mobile  Adnexa  Without masses or tenderness  Anus and perineum  normal   Rectovaginal  normal sphincter tone without palpated masses or tenderness             Hemoccult PCP will provide     Assessment/Plan:  64 y.o. 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 Blood work will be done by her PCP. Patient to schedule her bone density study the end of October. She was once again counseled on the detrimental effects of smoking. We discussed importance of calcium vitamin D and regular exercise for osteoporosis prevention.   Terrance Mass MD, 4:00 PM 11/24/2014

## 2014-11-27 ENCOUNTER — Other Ambulatory Visit: Payer: Self-pay | Admitting: Family Medicine

## 2014-11-27 DIAGNOSIS — L930 Discoid lupus erythematosus: Secondary | ICD-10-CM

## 2014-11-27 DIAGNOSIS — Z83438 Family history of other disorder of lipoprotein metabolism and other lipidemia: Secondary | ICD-10-CM

## 2014-12-05 ENCOUNTER — Other Ambulatory Visit (INDEPENDENT_AMBULATORY_CARE_PROVIDER_SITE_OTHER): Payer: Medicare Other

## 2014-12-05 DIAGNOSIS — Z8349 Family history of other endocrine, nutritional and metabolic diseases: Secondary | ICD-10-CM | POA: Diagnosis not present

## 2014-12-05 DIAGNOSIS — L930 Discoid lupus erythematosus: Secondary | ICD-10-CM | POA: Diagnosis not present

## 2014-12-05 DIAGNOSIS — Z83438 Family history of other disorder of lipoprotein metabolism and other lipidemia: Secondary | ICD-10-CM

## 2014-12-05 LAB — LIPID PANEL
CHOL/HDL RATIO: 4
Cholesterol: 158 mg/dL (ref 0–200)
HDL: 41.9 mg/dL (ref >39.00)
LDL CALC: 78 mg/dL (ref 0–99)
NonHDL: 116.5
Triglycerides: 194 mg/dL — ABNORMAL HIGH (ref 0.0–149.0)
VLDL: 38.8 mg/dL (ref 0.0–40.0)

## 2014-12-05 LAB — COMPREHENSIVE METABOLIC PANEL
ALT: 18 U/L (ref 0–35)
AST: 17 U/L (ref 0–37)
Albumin: 4 g/dL (ref 3.5–5.2)
Alkaline Phosphatase: 88 U/L (ref 39–117)
BUN: 15 mg/dL (ref 6–23)
CO2: 27 meq/L (ref 19–32)
CREATININE: 0.85 mg/dL (ref 0.40–1.20)
Calcium: 9.4 mg/dL (ref 8.4–10.5)
Chloride: 106 mEq/L (ref 96–112)
GFR: 71.3 mL/min (ref >60.00)
Glucose, Bld: 90 mg/dL (ref 70–99)
Potassium: 4.4 mEq/L (ref 3.5–5.1)
SODIUM: 141 meq/L (ref 135–145)
Total Bilirubin: 0.4 mg/dL (ref 0.2–1.2)
Total Protein: 6.3 g/dL (ref 6.0–8.3)

## 2014-12-11 ENCOUNTER — Ambulatory Visit (INDEPENDENT_AMBULATORY_CARE_PROVIDER_SITE_OTHER): Payer: Medicare Other | Admitting: Family Medicine

## 2014-12-11 ENCOUNTER — Encounter: Payer: Self-pay | Admitting: Family Medicine

## 2014-12-11 VITALS — BP 110/68 | HR 82 | Temp 98.4°F | Ht 65.5 in | Wt 161.5 lb

## 2014-12-11 DIAGNOSIS — Z0000 Encounter for general adult medical examination without abnormal findings: Secondary | ICD-10-CM | POA: Diagnosis not present

## 2014-12-11 DIAGNOSIS — Z7189 Other specified counseling: Secondary | ICD-10-CM

## 2014-12-11 DIAGNOSIS — Z23 Encounter for immunization: Secondary | ICD-10-CM

## 2014-12-11 DIAGNOSIS — Z119 Encounter for screening for infectious and parasitic diseases, unspecified: Secondary | ICD-10-CM

## 2014-12-11 NOTE — Patient Instructions (Addendum)
Check with your insurance to see if they will cover the shingles shot. Take care.  Glad to see you.

## 2014-12-11 NOTE — Progress Notes (Signed)
Pre visit review using our clinic review tool, if applicable. No additional management support is needed unless otherwise documented below in the visit note.  I have personally reviewed the Medicare Annual Wellness questionnaire and have noted 1. The patient's medical and social history 2. Their use of alcohol, tobacco or illicit drugs 3. Their current medications and supplements 4. The patient's functional ability including ADL's, fall risks, home safety risks and hearing or visual             impairment. 5. Diet and physical activities 6. Evidence for depression or mood disorders  The patients weight, height, BMI have been recorded in the chart and visual acuity is per eye clinic.  I have made referrals, counseling and provided education to the patient based review of the above and I have provided the pt with a written personalized care plan for preventive services.  Provider list updated- see scanned forms.  Routine anticipatory guidance given to patient.  See health maintenance.  Flu 2016 Shingles d/w pt.  PNA 2016 Tetanus 2015 Colonoscopy 2011 Breast cancer screening 2016 DXA f/u pending Advance directive- husband designated if patient were incapacitated.   Cognitive function addressed- see scanned forms- and if abnormal then additional documentation follows.  Pt opts in for HIV screening with next set of labs.  D/w pt re: routine screening.    PMH and SH reviewed  Meds, vitals, and allergies reviewed.   ROS: See HPI.  Otherwise negative.    GEN: nad, alert and oriented HEENT: mucous membranes moist NECK: supple w/o LA CV: rrr. Soft SEM noted, similar to prev PULM: ctab, no inc wob ABD: soft, +bs EXT: no edema SKIN: no acute rash

## 2014-12-13 DIAGNOSIS — Z7189 Other specified counseling: Secondary | ICD-10-CM | POA: Insufficient documentation

## 2014-12-13 DIAGNOSIS — Z0000 Encounter for general adult medical examination without abnormal findings: Secondary | ICD-10-CM | POA: Insufficient documentation

## 2014-12-13 NOTE — Assessment & Plan Note (Signed)
Flu 2016 Shingles d/w pt.  PNA 2016 Tetanus 2015 Colonoscopy 2011 Breast cancer screening 2016 DXA f/u pending Advance directive- husband designated if patient were incapacitated.   Cognitive function addressed- see scanned forms- and if abnormal then additional documentation follows.  Pt opts in for HIV screening with next set of labs.  D/w pt re: routine screening.   dw pt about smoking cessation and diet/exercise since TG mildly up.

## 2015-01-23 ENCOUNTER — Other Ambulatory Visit: Payer: Self-pay | Admitting: Gynecology

## 2015-01-23 ENCOUNTER — Ambulatory Visit (INDEPENDENT_AMBULATORY_CARE_PROVIDER_SITE_OTHER): Payer: Medicare Other

## 2015-01-23 DIAGNOSIS — Z780 Asymptomatic menopausal state: Secondary | ICD-10-CM | POA: Diagnosis not present

## 2015-01-26 ENCOUNTER — Other Ambulatory Visit: Payer: Self-pay | Admitting: *Deleted

## 2015-03-29 ENCOUNTER — Other Ambulatory Visit (HOSPITAL_COMMUNITY): Payer: Self-pay | Admitting: Gastroenterology

## 2015-03-29 ENCOUNTER — Ambulatory Visit (HOSPITAL_COMMUNITY)
Admission: RE | Admit: 2015-03-29 | Discharge: 2015-03-29 | Disposition: A | Discharge status: Home / Self Care | Payer: Medicare Other | Source: Ambulatory Visit | Attending: Gastroenterology | Admitting: Gastroenterology

## 2015-03-29 DIAGNOSIS — Q438 Other specified congenital malformations of intestine: Secondary | ICD-10-CM | POA: Diagnosis not present

## 2015-03-29 DIAGNOSIS — Z8719 Personal history of other diseases of the digestive system: Secondary | ICD-10-CM | POA: Insufficient documentation

## 2015-03-29 DIAGNOSIS — K5730 Diverticulosis of large intestine without perforation or abscess without bleeding: Secondary | ICD-10-CM | POA: Diagnosis not present

## 2015-03-29 LAB — HM COLONOSCOPY

## 2015-04-26 ENCOUNTER — Encounter: Payer: Self-pay | Admitting: Family Medicine

## 2015-09-13 ENCOUNTER — Telehealth: Payer: Self-pay

## 2015-09-13 NOTE — Telephone Encounter (Signed)
Patient is on the list for [employer] 2017 and may be a good candidate for an AWV in 2017. Please let me know if/when appt is scheduled.   

## 2015-09-17 NOTE — Telephone Encounter (Signed)
Pt sch for AWV and CPE in October [place]

## 2015-11-23 ENCOUNTER — Encounter: Payer: Self-pay | Admitting: Anesthesiology

## 2015-11-28 ENCOUNTER — Encounter: Payer: Self-pay | Admitting: Gynecology

## 2015-11-30 ENCOUNTER — Encounter: Payer: Self-pay | Admitting: Gynecology

## 2015-11-30 ENCOUNTER — Ambulatory Visit (INDEPENDENT_AMBULATORY_CARE_PROVIDER_SITE_OTHER): Payer: Medicare Other | Admitting: Gynecology

## 2015-11-30 VITALS — BP 126/78 | Ht 65.0 in | Wt 163.0 lb

## 2015-11-30 DIAGNOSIS — Z01419 Encounter for gynecological examination (general) (routine) without abnormal findings: Secondary | ICD-10-CM

## 2015-11-30 NOTE — Patient Instructions (Signed)

## 2015-11-30 NOTE — Progress Notes (Signed)
Ashlee Allen 12/15/1950 [id number]   History:    65 y.o.  for annual gyn exam with no complaints today. Patient continues to smoke one half pack per day despite being counseled on numerous occasions.Review of patient's records indicated that she had a total abdominal hysterectomy for fibroids along with a Burch bladder suspension back in 1997. She was on vaginal estrogen twice a week and is no longer on it. She states that occasionally she still will leak urine when coughing or straining and she does wear a pad. Patient had a normal bone density study in 2016. Patient many years ago had colon polyps on colonoscopy but her most recent colonoscopy this year was normal and she was instructed to return back now in 10 years. Dr. Damita Dunnings is her PCP who has been doing her blood work. Dr. 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  Patient denied any past history of abnormal Pap smears. She's currently not taking any calcium or vitamin D.   Past medical history,surgical history, family history and social history were all reviewed and documented in the EPIC chart.  Gynecologic History No LMP recorded. Patient has had a hysterectomy. Contraception: status post hysterectomy Last Pap: 2012. Results were: normal Last mammogram: 2017. Results were: Has a follow-up scheduled in 6 months we do not have the most recent mammogram since it was done a few days ago  Obstetric History OB History  Gravida Para Term Preterm AB Living  3 2 2   1 2   SAB TAB Ectopic Multiple Live Births               # Outcome Date GA Lbr Len/2nd Weight Sex Delivery Anes PTL Lv  3 AB           2 Term           1 Term                ROS: A ROS was performed and pertinent positives and negatives are included in the history.  GENERAL: No fevers or chills. HEENT: No change in vision, no earache, sore throat or sinus congestion. NECK: No pain or stiffness. CARDIOVASCULAR: No  chest pain or pressure. No palpitations. PULMONARY: No shortness of breath, cough or wheeze. GASTROINTESTINAL: No abdominal pain, nausea, vomiting or diarrhea, melena or bright red blood per rectum. GENITOURINARY: No urinary frequency, urgency, hesitancy or dysuria. MUSCULOSKELETAL: No joint or muscle pain, no back pain, no recent trauma. DERMATOLOGIC: No rash, no itching, no lesions. ENDOCRINE: No polyuria, polydipsia, no heat or cold intolerance. No recent change in weight. HEMATOLOGICAL: No anemia or easy bruising or bleeding. NEUROLOGIC: No headache, seizures, numbness, tingling or weakness. PSYCHIATRIC: No depression, no loss of interest in normal activity or change in sleep pattern.     Exam: chaperone present  BP 126/78   Ht 5\' 5"  (1.651 m)   Wt 163 lb (73.9 kg)   BMI 27.12 kg/m   Body mass index is 27.12 kg/m.  General appearance : Well developed well nourished female. No acute distress HEENT: Eyes: no retinal hemorrhage or exudates,  Neck supple, trachea midline, no carotid bruits, no thyroidmegaly Lungs: Clear to auscultation, no rhonchi or wheezes, or rib retractions  Heart: Regular rate and rhythm, no murmurs or gallops Breast:Examined in sitting and supine position were symmetrical in appearance, no palpable masses or tenderness,  no skin retraction, no nipple inversion, no nipple discharge, no skin discoloration,  no axillary or supraclavicular lymphadenopathy Abdomen: no palpable masses or tenderness, no rebound or guarding Extremities: no edema or skin discoloration or tenderness  Pelvic:  Bartholin, Urethra, Skene Glands: Within normal limits             Vagina: No gross lesions or discharge  Cervix: Absent  Uterus  absent  Adnexa  Without masses or tenderness  Anus and perineum  normal   Rectovaginal  normal sphincter tone without palpated masses or tenderness             Hemoccult colonoscopy less than 12 months ago normal     Assessment/Plan:  65 y.o. female for  annual exam postmenopausal chronic smoker of a pack cigarette per day. Patient once again was counseled on the detrimental effects of smoking. Pap smear not indicated according to new guidelines. Patient was reminded of the importance of calcium vitamin D and weightbearing exercises for osteoporosis prevention. She scheduled for bone density study next year. Patient's PCP in the next few weeks we'll be doing her blood work and she'll receive her flu vaccine then.   Terrance Mass MD, 11:12 AM 11/30/2015

## 2015-12-17 ENCOUNTER — Other Ambulatory Visit: Payer: Self-pay | Admitting: Family Medicine

## 2015-12-17 DIAGNOSIS — E781 Pure hyperglyceridemia: Secondary | ICD-10-CM

## 2015-12-19 ENCOUNTER — Other Ambulatory Visit (INDEPENDENT_AMBULATORY_CARE_PROVIDER_SITE_OTHER): Payer: Medicare Other

## 2015-12-19 ENCOUNTER — Ambulatory Visit (INDEPENDENT_AMBULATORY_CARE_PROVIDER_SITE_OTHER): Payer: Medicare Other

## 2015-12-19 VITALS — BP 108/70 | HR 86 | Temp 98.1°F | Ht 65.5 in | Wt 159.8 lb

## 2015-12-19 DIAGNOSIS — Z23 Encounter for immunization: Secondary | ICD-10-CM | POA: Diagnosis not present

## 2015-12-19 DIAGNOSIS — Z0000 Encounter for general adult medical examination without abnormal findings: Secondary | ICD-10-CM

## 2015-12-19 DIAGNOSIS — E781 Pure hyperglyceridemia: Secondary | ICD-10-CM

## 2015-12-19 DIAGNOSIS — Z119 Encounter for screening for infectious and parasitic diseases, unspecified: Secondary | ICD-10-CM

## 2015-12-19 LAB — COMPREHENSIVE METABOLIC PANEL
ALBUMIN: 4.1 g/dL (ref 3.5–5.2)
ALT: 22 U/L (ref 0–35)
AST: 19 U/L (ref 0–37)
Alkaline Phosphatase: 81 U/L (ref 39–117)
BUN: 16 mg/dL (ref 6–23)
CHLORIDE: 106 meq/L (ref 96–112)
CO2: 28 mEq/L (ref 19–32)
Calcium: 9.3 mg/dL (ref 8.4–10.5)
Creatinine, Ser: 0.84 mg/dL (ref 0.40–1.20)
GFR: 72.05 mL/min (ref >60.00)
GLUCOSE: 87 mg/dL (ref 70–99)
POTASSIUM: 4.2 meq/L (ref 3.5–5.1)
SODIUM: 141 meq/L (ref 135–145)
TOTAL PROTEIN: 7 g/dL (ref 6.0–8.3)
Total Bilirubin: 0.4 mg/dL (ref 0.2–1.2)

## 2015-12-19 LAB — LIPID PANEL
CHOLESTEROL: 164 mg/dL (ref 0–200)
HDL: 40.1 mg/dL (ref >39.00)
LDL Cholesterol: 85 mg/dL (ref 0–99)
NonHDL: 123.85
Total CHOL/HDL Ratio: 4
Triglycerides: 193 mg/dL — ABNORMAL HIGH (ref 0.0–149.0)
VLDL: 38.6 mg/dL (ref 0.0–40.0)

## 2015-12-19 NOTE — Progress Notes (Signed)
PCP notes:   Health maintenance:  Flu vaccine - administered PPSV23 - administered Shingles - will discuss with PCP at CPE  Abnormal screenings:   None  Patient concerns:   None  Nurse concerns:  None  Next PCP appt:   12/24/15 @ 1045

## 2015-12-19 NOTE — Progress Notes (Signed)
Subjective:   Ashlee Allen is a 65 y.o. female who presents for Medicare Annual (Subsequent) preventive examination.  Review of Systems:  N/A Cardiac Risk Factors include: advanced age (>20men, >67 women);smoking/ tobacco exposure     Objective:     Vitals: BP 108/70 (BP Location: Right Arm, Patient Position: Sitting, Cuff Size: Normal)   Pulse 86   Temp 98.1 F (36.7 C) (Oral)   Ht 5' 5.5" (1.664 m) Comment: no shoes  Wt 159 lb 12 oz (72.5 kg)   SpO2 93%   BMI 26.18 kg/m   Body mass index is 26.18 kg/m.   Tobacco History  Smoking Status  . Current Every Day Smoker  . Packs/day: 1.00  . Years: 40.00  Smokeless Tobacco  . Never Used     Ready to quit: No Counseling given: No   Past Medical History:  Diagnosis Date  . Cardiac murmur    echo 8-9 yr ago-not sure where-no tx  . Depression   . Discoid lupus    Dr. Kalman Shan (prev seen at [facility]) and Dr. Ubaldo Glassing  . Fibroid   . GERD (gastroesophageal reflux disease)    controllled with weight loss    Past Surgical History:  Procedure Laterality Date  . ABDOMINAL HYSTERECTOMY  1997   TAH  [facility]  . BREAST SURGERY  2001   Duct excised-rt  . CHOLECYSTECTOMY  1991  . COLONOSCOPY    . HAND SURGERY  2001   ctr-both  . INNER EAR SURGERY     left  . KNEE ARTHROSCOPY Left 05/20/2013   Procedure: LEFT KNEE ARTHROSCOPY WITH DEBRIDEMENT/SHAVING (CHONDROPLASTY) AND LATERAL AND MEDIAL MENISECTOMY, EXCISION OF PLICA;  Surgeon: Yvette Rack., MD;  Location: [place];  Service: Orthopedics;  Laterality: Left;   Family History  Problem Relation Age of Onset  . Alcohol abuse Mother   . Hyperlipidemia Mother   . Hypertension Mother   . Diabetes Mother   . Cancer Mother     LUNG- SMOKER   . Alcohol abuse Father   . Cancer Father     lung  . Seizures Brother   . Colon cancer Neg Hx   . Breast cancer Neg Hx    History  Sexual Activity  . Sexual activity: Yes  . Birth control/ protection: Surgical   Comment: partner - less than 5 (only 1)    Outpatient Encounter Prescriptions as of 12/19/2015  Medication Sig  . calcium carbonate (TUMS) 500 MG chewable tablet Chew 1 tablet by mouth as needed for indigestion or heartburn.  . hydroxychloroquine (PLAQUENIL) 200 MG tablet Take 200 mg by mouth 2 (two) times daily.    No facility-administered encounter medications on file as of 12/19/2015.     Activities of Daily Living In your present state of health, do you have any difficulty performing the following activities: 12/19/2015  Hearing? Y  Vision? N  Difficulty concentrating or making decisions? N  Walking or climbing stairs? N  Dressing or bathing? N  Doing errands, shopping? N  Preparing Food and eating ? N  Using the Toilet? N  In the past six months, have you accidently leaked urine? N  Do you have problems with loss of bowel control? N  Managing your Medications? N  Managing your Finances? N  Housekeeping or managing your Housekeeping? N  Some recent data might be hidden    Patient Care Team: Tonia Ghent, MD as PCP - General    Assessment:  Hearing Screening Comments: Bilateral hearing aids Vision Screening Comments: Last vision in May 2017 with Dr. [place]  Exercise Activities and Dietary recommendations Current Exercise Habits: Home exercise routine, Type of exercise: walking, Time (Minutes): 30, Frequency (Times/Week): 7, Weekly Exercise (Minutes/Week): 210, Intensity: Mild, Exercise limited by: None identified  Goals    . Increase physical activity          Starting 12/19/2015, I will continue to walking for at least 30 min daily.       Fall Risk Fall Risk  12/19/2015 12/11/2014  Falls in the past year? No No   Depression Screen PHQ 2/9 Scores 12/19/2015 12/11/2014  PHQ - 2 Score 0 0     Cognitive Testing MMSE - Mini Mental State Exam 12/19/2015  Orientation to time 5  Orientation to Place 5  Registration 3  Attention/ Calculation 0  Recall 3    Language- name 2 objects 0  Language- repeat 1  Language- follow 3 step command 3  Language- read & follow direction 0  Write a sentence 0  Copy design 0  Total score 20   PLEASE NOTE: A Mini-Cog screen was completed. Maximum score is 20. A value of 0 denotes this part of Folstein MMSE was not completed or the patient failed this part of the Mini-Cog screening.   Mini-Cog Screening Orientation to Time - Max 5 pts Orientation to Place - Max 5 pts Registration - Max 3 pts Recall - Max 3 pts Language Repeat - Max 1 pts Language Follow 3 Step Command - Max 3 pts   Immunization History  Administered Date(s) Administered  . Influenza Split 12/25/2010, 12/17/2011, 12/02/2012  . Influenza Whole 01/08/2010, 12/17/2011  . Influenza,inj,Quad PF,36+ Mos 12/11/2014, 12/19/2015  . Influenza-Unspecified 12/07/2013  . Pneumococcal Conjugate-13 12/11/2014  . Pneumococcal Polysaccharide-23 12/19/2015  . Td 08/24/2007  . Tdap 04/28/2013   Screening Tests Health Maintenance  Topic Date Due  . ZOSTAVAX  12/18/2016 (Originally 09/27/2009)  . MAMMOGRAM  11/22/2017  . COLONOSCOPY  03/28/2020  . TETANUS/TDAP  04/29/2023  . INFLUENZA VACCINE  Completed  . DEXA SCAN  Completed  . Hepatitis C Screening  Completed  . PNA vac Low Risk Adult  Completed      Plan:     I have personally reviewed and addressed the Medicare Annual Wellness questionnaire and have noted the following in the patient's chart:  A. Medical and social history B. Use of alcohol, tobacco or illicit drugs  C. Current medications and supplements D. Functional ability and status E.  Nutritional status F.  Physical activity G. Advance directives H. List of other physicians I.  Hospitalizations, surgeries, and ER visits in previous 12 months J.  [place] to include hearing, vision, cognitive, depression L. Referrals and appointments - none  In addition, I have reviewed and discussed with patient certain  preventive protocols, quality metrics, and best practice recommendations. A written personalized care plan for preventive services as well as general preventive health recommendations were provided to patient.  See attached scanned questionnaire for additional information.   Signed,   Lindell Noe, MHA, BS, LPN Health Coach

## 2015-12-19 NOTE — Patient Instructions (Signed)
Ms. Ankeny , Thank you for taking time to come for your Medicare Wellness Visit. I appreciate your ongoing commitment to your health goals. Please review the following plan we discussed and let me know if I can assist you in the future.   These are the goals we discussed: Goals    . Increase physical activity          Starting 12/19/2015, I will continue to walking for at least 30 min daily.        This is a list of the screening recommended for you and due dates:  Health Maintenance  Topic Date Due  . Shingles Vaccine  12/18/2016*  . Mammogram  11/22/2017  . Colon Cancer Screening  03/28/2020  . Tetanus Vaccine  04/29/2023  . Flu Shot  Completed  . DEXA scan (bone density measurement)  Completed  .  Hepatitis C: One time screening is recommended by Center for Disease Control  (CDC) for  adults born from [birth year] through 1966.   Completed  . Pneumonia vaccines  Completed  *Topic was postponed. The date shown is not the original due date.   Preventive Care for Adults  A healthy lifestyle and preventive care can promote health and wellness. Preventive health guidelines for adults include the following key practices.  . A routine yearly physical is a good way to check with your health care provider about your health and preventive screening. It is a chance to share any concerns and updates on your health and to receive a thorough exam.  . Visit your dentist for a routine exam and preventive care every 6 months. Brush your teeth twice a day and floss once a day. Good oral hygiene prevents tooth decay and gum disease.  . The frequency of eye exams is based on your age, health, family medical history, use  of contact lenses, and other factors. Follow your health care provider's ecommendations for frequency of eye exams.  . Eat a healthy diet. Foods like vegetables, fruits, whole grains, low-fat dairy products, and lean protein foods contain the nutrients you need without too many calories.  Decrease your intake of foods high in solid fats, added sugars, and salt. Eat the right amount of calories for you. Get information about a proper diet from your health care provider, if necessary.  . Regular physical exercise is one of the most important things you can do for your health. Most adults should get at least 150 minutes of moderate-intensity exercise (any activity that increases your heart rate and causes you to sweat) each week. In addition, most adults need muscle-strengthening exercises on 2 or more days a week.  Silver Sneakers may be a benefit available to you. To determine eligibility, you may visit the website: www.silversneakers.com or contact program at 939-099-6571 Mon-Fri between 8AM-8PM.   . Maintain a healthy weight. The body mass index (BMI) is a screening tool to identify possible weight problems. It provides an estimate of body fat based on height and weight. Your health care provider can find your BMI and can help you achieve or maintain a healthy weight.   For adults 20 years and older: ? A BMI below 18.5 is considered underweight. ? A BMI of 18.5 to 24.9 is normal. ? A BMI of 25 to 29.9 is considered overweight. ? A BMI of 30 and above is considered obese.   . Maintain normal blood lipids and cholesterol levels by exercising and minimizing your intake of saturated fat. Eat a balanced diet  with plenty of fruit and vegetables. Blood tests for lipids and cholesterol should begin at age 65 and be repeated every 5 years. If your lipid or cholesterol levels are high, you are over 50, or you are at high risk for heart disease, you may need your cholesterol levels checked more frequently. Ongoing high lipid and cholesterol levels should be treated with medicines if diet and exercise are not working.  . If you smoke, find out from your health care provider how to quit. If you do not use tobacco, please do not start.  . If you choose to drink alcohol, please do not consume  more than 2 drinks per day. One drink is considered to be 12 ounces (355 mL) of beer, 5 ounces (148 mL) of wine, or 1.5 ounces (44 mL) of liquor.  . If you are 101-35 years old, ask your health care provider if you should take aspirin to prevent strokes.  . Use sunscreen. Apply sunscreen liberally and repeatedly throughout the day. You should seek shade when your shadow is shorter than you. Protect yourself by wearing long sleeves, pants, a wide-brimmed hat, and sunglasses year round, whenever you are outdoors.  . Once a month, do a whole body skin exam, using a mirror to look at the skin on your back. Tell your health care provider of new moles, moles that have irregular borders, moles that are larger than a pencil eraser, or moles that have changed in shape or color.

## 2015-12-19 NOTE — Progress Notes (Signed)
   Subjective:    Patient ID: Ashlee Allen, female    DOB: Nov 07, 1950, 65 y.o.   MRN: [id number]  HPI I reviewed health advisor's note, was available for consultation, and agree with documentation and plan.    Review of Systems     Objective:   Physical Exam        Assessment & Plan:

## 2015-12-19 NOTE — Progress Notes (Signed)
Pre visit review using our clinic review tool, if applicable. No additional management support is needed unless otherwise documented below in the visit note. 

## 2015-12-20 LAB — HIV ANTIBODY (ROUTINE TESTING W REFLEX): HIV: NONREACTIVE

## 2015-12-24 ENCOUNTER — Ambulatory Visit (INDEPENDENT_AMBULATORY_CARE_PROVIDER_SITE_OTHER): Payer: Medicare Other | Admitting: Family Medicine

## 2015-12-24 ENCOUNTER — Encounter: Payer: Self-pay | Admitting: Family Medicine

## 2015-12-24 VITALS — BP 116/64 | HR 83 | Temp 98.2°F | Ht 66.0 in | Wt 162.8 lb

## 2015-12-24 DIAGNOSIS — F17200 Nicotine dependence, unspecified, uncomplicated: Secondary | ICD-10-CM | POA: Diagnosis not present

## 2015-12-24 DIAGNOSIS — E785 Hyperlipidemia, unspecified: Secondary | ICD-10-CM | POA: Diagnosis not present

## 2015-12-24 DIAGNOSIS — L930 Discoid lupus erythematosus: Secondary | ICD-10-CM

## 2015-12-24 NOTE — Patient Instructions (Addendum)
Check with your insurance to see if they will cover the shingles shot. Don't change your meds for now.  Take care.  Glad to see you.  Update me as needed.   If I can help you stop smoking then please let me know.

## 2015-12-24 NOTE — Progress Notes (Signed)
Smoking cessation encouraged.    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  Still on plaquenil.  Per Dr. Ubaldo Glassing with derm.    Shingles shot d/w pt.  See AVS.    Advance directive- husband designated if patient were incapacitated.    HLD.  TG elevation.  Mild.  D/w pt about diet and exercise.   Labs d/w pt.    She saw [facility] re: hearing loss.  She has hearing aid in R ear, with mold pending for L ear.    PMH and SH reviewed  ROS: Per HPI unless specifically indicated in ROS section   Meds, vitals, and allergies reviewed.   GEN: nad, alert and oriented HEENT: mucous membranes moist, chronic B TM changes noted, chronic changes posterior to L ear noted NECK: supple w/o LA CV: rrr. PULM: ctab, no inc wob ABD: soft, +bs EXT: no edema SKIN: no acute rash but small discoid lupus lesion noted on the upper central forehead.

## 2015-12-24 NOTE — Progress Notes (Signed)
Pre visit review using our clinic review tool, if applicable. No additional management support is needed unless otherwise documented below in the visit note. 

## 2015-12-25 DIAGNOSIS — E785 Hyperlipidemia, unspecified: Secondary | ICD-10-CM | POA: Insufficient documentation

## 2015-12-25 NOTE — Assessment & Plan Note (Signed)
Per dermatology. 

## 2015-12-25 NOTE — Assessment & Plan Note (Signed)
Discussed with patient about labs and diet and exercise. 

## 2015-12-25 NOTE — Assessment & Plan Note (Signed)
Encourage cessation. °

## 2016-04-21 ENCOUNTER — Observation Stay (HOSPITAL_BASED_OUTPATIENT_CLINIC_OR_DEPARTMENT_OTHER)
Admission: EM | Admit: 2016-04-21 | Discharge: 2016-04-22 | Disposition: A | Discharge status: Home / Self Care | Payer: Medicare Other | Attending: Surgery | Admitting: Surgery

## 2016-04-21 ENCOUNTER — Emergency Department (HOSPITAL_BASED_OUTPATIENT_CLINIC_OR_DEPARTMENT_OTHER): Payer: Medicare Other

## 2016-04-21 ENCOUNTER — Encounter (HOSPITAL_BASED_OUTPATIENT_CLINIC_OR_DEPARTMENT_OTHER): Payer: Self-pay | Admitting: *Deleted

## 2016-04-21 DIAGNOSIS — I708 Atherosclerosis of other arteries: Secondary | ICD-10-CM | POA: Insufficient documentation

## 2016-04-21 DIAGNOSIS — Z888 Allergy status to other drugs, medicaments and biological substances status: Secondary | ICD-10-CM | POA: Insufficient documentation

## 2016-04-21 DIAGNOSIS — K589 Irritable bowel syndrome without diarrhea: Secondary | ICD-10-CM | POA: Diagnosis not present

## 2016-04-21 DIAGNOSIS — K5730 Diverticulosis of large intestine without perforation or abscess without bleeding: Secondary | ICD-10-CM | POA: Insufficient documentation

## 2016-04-21 DIAGNOSIS — R911 Solitary pulmonary nodule: Secondary | ICD-10-CM | POA: Diagnosis not present

## 2016-04-21 DIAGNOSIS — Z9049 Acquired absence of other specified parts of digestive tract: Secondary | ICD-10-CM | POA: Insufficient documentation

## 2016-04-21 DIAGNOSIS — Z90710 Acquired absence of both cervix and uterus: Secondary | ICD-10-CM | POA: Diagnosis not present

## 2016-04-21 DIAGNOSIS — M5137 Other intervertebral disc degeneration, lumbosacral region: Secondary | ICD-10-CM | POA: Diagnosis not present

## 2016-04-21 DIAGNOSIS — K219 Gastro-esophageal reflux disease without esophagitis: Secondary | ICD-10-CM | POA: Insufficient documentation

## 2016-04-21 DIAGNOSIS — K37 Unspecified appendicitis: Secondary | ICD-10-CM | POA: Diagnosis present

## 2016-04-21 DIAGNOSIS — K353 Acute appendicitis with localized peritonitis, without perforation or gangrene: Secondary | ICD-10-CM

## 2016-04-21 DIAGNOSIS — Z91040 Latex allergy status: Secondary | ICD-10-CM | POA: Diagnosis not present

## 2016-04-21 DIAGNOSIS — L930 Discoid lupus erythematosus: Secondary | ICD-10-CM | POA: Insufficient documentation

## 2016-04-21 DIAGNOSIS — K760 Fatty (change of) liver, not elsewhere classified: Secondary | ICD-10-CM | POA: Insufficient documentation

## 2016-04-21 DIAGNOSIS — F17210 Nicotine dependence, cigarettes, uncomplicated: Secondary | ICD-10-CM | POA: Insufficient documentation

## 2016-04-21 LAB — CBC
HEMATOCRIT: 39 % (ref 36.0–46.0)
HEMOGLOBIN: 13.6 g/dL (ref 12.0–15.0)
MCH: 32.9 pg (ref 26.0–34.0)
MCHC: 34.9 g/dL (ref 30.0–36.0)
MCV: 94.4 fL (ref 78.0–100.0)
Platelets: 174 10*3/uL (ref 150–400)
RBC: 4.13 MIL/uL (ref 3.87–5.11)
RDW: 12.4 % (ref 11.5–15.5)
WBC: 12 10*3/uL — ABNORMAL HIGH (ref 4.0–10.5)

## 2016-04-21 LAB — COMPREHENSIVE METABOLIC PANEL
ALBUMIN: 4 g/dL (ref 3.5–5.0)
ALT: 20 U/L (ref 14–54)
ANION GAP: 10 (ref 5–15)
AST: 29 U/L (ref 15–41)
Alkaline Phosphatase: 81 U/L (ref 38–126)
BUN: 10 mg/dL (ref 6–20)
CHLORIDE: 105 mmol/L (ref 101–111)
CO2: 21 mmol/L — ABNORMAL LOW (ref 22–32)
Calcium: 8.7 mg/dL — ABNORMAL LOW (ref 8.9–10.3)
Creatinine, Ser: 0.72 mg/dL (ref 0.44–1.00)
GFR calc Af Amer: >60 mL/min (ref >60)
GFR calc non Af Amer: >60 mL/min (ref >60)
GLUCOSE: 115 mg/dL — AB (ref 65–99)
POTASSIUM: 3.5 mmol/L (ref 3.5–5.1)
SODIUM: 136 mmol/L (ref 135–145)
Total Bilirubin: 0.8 mg/dL (ref 0.3–1.2)
Total Protein: 6.7 g/dL (ref 6.5–8.1)

## 2016-04-21 LAB — URINALYSIS, ROUTINE W REFLEX MICROSCOPIC
Bilirubin Urine: NEGATIVE
GLUCOSE, UA: NEGATIVE mg/dL
Hgb urine dipstick: NEGATIVE
Ketones, ur: NEGATIVE mg/dL
LEUKOCYTES UA: NEGATIVE
Nitrite: NEGATIVE
PH: 6 (ref 5.0–8.0)
PROTEIN: NEGATIVE mg/dL
SPECIFIC GRAVITY, URINE: 1.01 (ref 1.005–1.030)

## 2016-04-21 LAB — LIPASE, BLOOD: Lipase: 22 U/L (ref 11–51)

## 2016-04-21 MED ORDER — FENTANYL CITRATE (PF) 250 MCG/5ML IJ SOLN
INTRAMUSCULAR | Status: AC
  Filled 2016-04-21: qty 5

## 2016-04-21 MED ORDER — MIDAZOLAM HCL 2 MG/2ML IJ SOLN
INTRAMUSCULAR | Status: AC
  Filled 2016-04-21: qty 2

## 2016-04-21 MED ORDER — PIPERACILLIN-TAZOBACTAM 3.375 G IVPB 30 MIN
3.3750 g | Freq: Once | INTRAVENOUS | Status: AC
  Administered 2016-04-21: 3.375 g via INTRAVENOUS
  Filled 2016-04-21 (×2): qty 50

## 2016-04-21 MED ORDER — FENTANYL CITRATE (PF) 100 MCG/2ML IJ SOLN
50.0000 ug | INTRAMUSCULAR | Status: DC | PRN
  Administered 2016-04-21: 50 ug via INTRAVENOUS
  Filled 2016-04-21: qty 2

## 2016-04-21 MED ORDER — ONDANSETRON HCL 4 MG/2ML IJ SOLN
4.0000 mg | INTRAMUSCULAR | Status: DC | PRN
  Administered 2016-04-21: 4 mg via INTRAVENOUS
  Filled 2016-04-21: qty 2

## 2016-04-21 MED ORDER — ONDANSETRON 4 MG PO TBDP
4.0000 mg | ORAL_TABLET | Freq: Once | ORAL | Status: AC
  Administered 2016-04-21: 4 mg via ORAL
  Filled 2016-04-21: qty 1

## 2016-04-21 MED ORDER — IOPAMIDOL (ISOVUE-300) INJECTION 61%
100.0000 mL | Freq: Once | INTRAVENOUS | Status: AC | PRN
  Administered 2016-04-21: 100 mL via INTRAVENOUS

## 2016-04-21 MED ORDER — SODIUM CHLORIDE 0.9 % IV BOLUS (SEPSIS)
1000.0000 mL | Freq: Once | INTRAVENOUS | Status: AC
  Administered 2016-04-21: 1000 mL via INTRAVENOUS

## 2016-04-21 MED ORDER — PROPOFOL 10 MG/ML IV BOLUS
INTRAVENOUS | Status: AC
  Filled 2016-04-21: qty 20

## 2016-04-21 MED ORDER — IBUPROFEN 400 MG PO TABS
400.0000 mg | ORAL_TABLET | Freq: Once | ORAL | Status: AC
  Administered 2016-04-21: 400 mg via ORAL
  Filled 2016-04-21: qty 1

## 2016-04-21 MED ORDER — ONDANSETRON HCL 4 MG/2ML IJ SOLN
INTRAMUSCULAR | Status: AC
  Filled 2016-04-21: qty 2

## 2016-04-21 NOTE — ED Triage Notes (Signed)
Fever, abdominal pain, diarrhea.  Cough. Nausea.

## 2016-04-21 NOTE — ED Provider Notes (Signed)
[facility]PT [facility] Provider Note   CSN: [id number] Arrival date & time: 04/21/16  1710  By signing my name below, I, Reola Mosher, attest that this documentation has been prepared under the direction and in the presence of Leo Grosser, MD. Electronically Signed: Reola Mosher, ED Scribe. 04/21/16. 8:21 PM.  History   Chief Complaint Chief Complaint  Patient presents with  . Fever  . Abdominal Pain   The history is provided by the patient. No language interpreter was used.  Fever   This is a new problem. The current episode started yesterday. The problem occurs constantly. Progression since onset: waxing and waning. The maximum temperature noted was 99 to 99.9 F. The temperature was taken using an oral thermometer. Associated symptoms include diarrhea and cough. Pertinent negatives include no vomiting. She has tried nothing for the symptoms.    HPI Comments: Ashlee Allen is a 66 y.o. female with a h/o GERD, who presents to the Emergency Department complaining of gradual onset, gradually worsening right lower quadrant abdominal pain beginning yesterday. She notes associated fever (Tmax 99.9), diarrhea, nausea. She quantifies her episodes of diarrhea at 15 since onset. She also reports that she has experienced a cough ongoing for the past three weeks. No treatments for her symptoms were tried prior to coming into the ED. Pt is a current, everyday smoker. She denies vomiting, or any other associated symptoms.   NPO time is 8:15pm.   Past Medical History:  Diagnosis Date  . Cardiac murmur    echo 8-9 yr ago-not sure where-no tx  . Depression   . Discoid lupus    Dr. Kalman Shan (prev seen at [facility]) and Dr. Ubaldo Glassing  . Fibroid   . GERD (gastroesophageal reflux disease)    controllled with weight loss    Patient Active Problem List   Diagnosis Date Noted  . HLD (hyperlipidemia) 12/25/2015  . Medicare annual wellness visit, initial 12/13/2014  . Advance care planning  12/13/2014  . Edema 07/01/2013  . Smoker 10/28/2012  . Urinary incontinence 10/28/2012  . History of cervical dysplasia 10/28/2012  . Menopause 10/14/2010  . 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  . GERD 01/15/2010  . LUPUS ERYTHEMATOSUS, DISCOID 01/15/2010  . COLONIC POLYPS, HX OF 01/15/2010   Past Surgical History:  Procedure Laterality Date  . ABDOMINAL HYSTERECTOMY  1997   TA[facility]  [facility]  . BREAST SURGERY  2001   Duct excised-rt  . CATARACT EXTRACTION, BILATERAL    . CHOLECYSTECTOMY  1991  . COLONOSCOPY    . HAND SURGERY  2001   ctr-both  . INNER EAR SURGERY     left- mastoidectomy  . KNEE ARTHROSCOPY Left 05/20/2013   Procedure: LEFT KNEE ARTHROSCOPY WITH DEBRIDEMENT/SHAVING (CHONDROPLASTY) AND LATERAL AND MEDIAL MENISECTOMY, EXCISION OF PLICA;  Surgeon: Yvette Rack., MD;  Location: [place];  Service: Orthopedics;  Laterality: Left;   OB History    Gravida Para Term Preterm AB Living   3 2 2   1 2    SAB TAB Ectopic Multiple Live Births                 Home Medications    Prior to Admission medications   Medication Sig Start Date End Date Taking? Authorizing Provider  calcium carbonate (TUMS) 500 MG chewable tablet Chew 1 tablet by mouth as needed for indigestion or heartburn.    Historical Provider, MD  hydroxychloroquine (PLAQUENIL) 200 MG tablet Take 200 mg by mouth 2 (two) times  daily.     Historical Provider, MD   Family History Family History  Problem Relation Age of Onset  . Alcohol abuse Mother   . Hyperlipidemia Mother   . Hypertension Mother   . Diabetes Mother   . Cancer Mother     LUNG- SMOKER   . Alcohol abuse Father   . Cancer Father     lung  . Seizures Brother   . Colon cancer Neg Hx   . Breast cancer Neg Hx    Social History Social History  Substance Use Topics  . Smoking status: Current Every Day Smoker    Packs/day: 1.00    Years: 40.00  . Smokeless tobacco: Never Used  . Alcohol use No   Allergies     Latex and Varenicline tartrate  Review of Systems Review of Systems  Constitutional: Positive for fever.  Respiratory: Positive for cough.   Gastrointestinal: Positive for abdominal pain and diarrhea. Negative for vomiting.  All other systems reviewed and are negative.  Physical Exam Updated Vital Signs BP 99/62 (BP Location: Right Arm)   Pulse 92   Temp 98.2 F (36.8 C) (Oral)   Resp 20   Ht 5' 5.5" (1.664 m)   Wt 162 lb (73.5 kg)   SpO2 96%   BMI 26.55 kg/m   Physical Exam  Constitutional: She is oriented to person, place, and time. She appears well-developed and well-nourished. No distress.  HENT:  Head: Normocephalic.  Nose: Nose normal.  Eyes: Conjunctivae are normal.  Neck: Neck supple. No tracheal deviation present.  Cardiovascular: Normal rate, regular rhythm, S1 normal, S2 normal and normal heart sounds.  Exam reveals no gallop and no friction rub.   No murmur heard. Pulmonary/Chest: Effort normal and breath sounds normal. No respiratory distress. She has no wheezes. She has no rales.  Abdominal: Soft. She exhibits no distension. There is tenderness. There is guarding. There is no rebound.  Guarding in RLQ with tenderness. No rebound.   Neurological: She is alert and oriented to person, place, and time.  Skin: Skin is warm and dry.  Psychiatric: She has a normal mood and affect.  Nursing note and vitals reviewed.  ED Treatments / Results  DIAGNOSTIC STUDIES: Oxygen Saturation is 96% on RA, normal by my interpretation.   COORDINATION OF CARE: 8:19 PM-Discussed next steps with pt. Pt verbalized understanding and is agreeable with the plan.   Labs (all labs ordered are listed, but only abnormal results are displayed) Labs Reviewed  COMPREHENSIVE METABOLIC PANEL - Abnormal; Notable for the following:       Result Value   CO2 21 (*)    Glucose, Bld 115 (*)    Calcium 8.7 (*)    All other components within normal limits  CBC - Abnormal; Notable for the  following:    WBC 12.0 (*)    All other components within normal limits  URINALYSIS, ROUTINE W REFLEX MICROSCOPIC  LIPASE, BLOOD   EKG  EKG Interpretation None      Radiology Dg Chest 2 View  Result Date: 04/21/2016 CLINICAL DATA:  Cough/bronchitis x 3 weeks, flu-like symptoms recently, also feels like "stomach bug." EXAM: CHEST - 2 VIEW COMPARISON:  10/28/2012 FINDINGS: Lungs are clear. Heart size and mediastinal contours are within normal limits. No effusion. Visualized bones unremarkable. IMPRESSION: No acute cardiopulmonary disease. Electronically Signed   By: Lucrezia Europe M.D.   On: 04/21/2016 20:07   Ct Abdomen Pelvis W Contrast  Result Date: 04/21/2016 CLINICAL DATA:  Fever, abdominal pain and diarrhea EXAM: CT ABDOMEN AND PELVIS WITH CONTRAST TECHNIQUE: Multidetector CT imaging of the abdomen and pelvis was performed using the standard protocol following bolus administration of intravenous contrast. CONTRAST:  16mL ISOVUE-300 IOPAMIDOL (ISOVUE-300) INJECTION 61% COMPARISON:  None. FINDINGS: Lower chest: 4 mm left lower lobe pulmonary nodule. No pulmonary consolidation or effusion. Mild fatty change noted about the visualized left atrium. Hepatobiliary: Cholecystectomy. Mild fatty infiltration of the liver without space-occupying mass. No biliary dilatation. Pancreas: Normal Spleen: Normal Adrenals/Urinary Tract: Normal bilateral adrenal glands. Duplicated right renal collecting system. No obstructive uropathy or nephrolithiasis. Unremarkable bladder. Stomach/Bowel: 11 mm in diameter appendix with periappendiceal inflammation consistent with acute appendicitis without complication. The appendix is seen in the right hemi pelvis overlying the right external iliac artery and vein. Colonic diverticulosis without acute diverticulitis most significantly affecting the distal descending and sigmoid colon. Vascular/Lymphatic: Aortoiliac atherosclerosis without aneurysm. No lymphadenopathy.  Reproductive: Hysterectomy.  No adnexal mass. Other: No free air.  No abscess collections. Musculoskeletal: [place] are degenerative disc disease L5-S1. No acute osseous abnormality. Lower lumbar facet arthropathy. IMPRESSION: 1. Acute uncomplicated appendicitis with the appendix measuring up to 11 mm in diameter. 2. 4 mm left lower lobe pulmonary nodule. No follow-up needed if patient is low-risk. Non-contrast chest CT can be considered in 12 months if patient is high-risk. This recommendation follows the consensus statement: Guidelines for Management of Incidental Pulmonary Nodules Detected on CT Images: From the Fleischner Society 2017; Radiology 2017; 284:228-243. 3. Hepatic steatosis. 4. Hysterectomy and cholecystectomy. Electronically Signed   By: Ashley Royalty M.D.   On: 04/21/2016 21:32   Procedures Procedures   Medications Ordered in ED Medications  fentaNYL (SUBLIMAZE) injection 50 mcg (not administered)  ondansetron (ZOFRAN) injection 4 mg (not administered)  sodium chloride 0.9 % bolus 1,000 mL (not administered)  piperacillin-tazobactam (ZOSYN) IVPB 3.375 g (not administered)  ondansetron (ZOFRAN-ODT) disintegrating tablet 4 mg (4 mg Oral Given 04/21/16 1747)  ibuprofen (ADVIL,MOTRIN) tablet 400 mg (400 mg Oral Given 04/21/16 1746)  iopamidol (ISOVUE-300) 61 % injection 100 mL (100 mLs Intravenous Contrast Given 04/21/16 2114)   Initial Impression / Assessment and Plan / ED Course  I have reviewed the triage vital signs and the nursing notes.  Pertinent labs & imaging results that were available during my care of the patient were reviewed by me and considered in my medical decision making (see chart for details).     66 y.o. female presents with Right lower quadrant abdominal pain following multiple episodes of loose, watery diarrhea. She states that she has history of IBS which caused her to have loose stools normally but the pain is new. She has localized peritonitis on exam suggesting  possible acute appendicitis which is confirmed on CT. Discussed with Dr. Lucia Gaskins who recommended transfer to [facility] emergency department for surgical consultation. Dr. Tamera Punt accepted the patient in transfer. She is stable for transport. NPO time is 1 PM for food at 8 PM for liquids.  Final Clinical Impressions(s) / ED Diagnoses   Final diagnoses:  Acute appendicitis with localized peritonitis    New Prescriptions New Prescriptions   No medications on file   I personally performed the services described in this documentation, which was scribed in my presence. The recorded information has been reviewed and is accurate.     Leo Grosser, MD 04/21/16 2217

## 2016-04-21 NOTE — ED Notes (Signed)
Charge rn at [facility] notified of pt being transported

## 2016-04-22 ENCOUNTER — Encounter (HOSPITAL_COMMUNITY): Payer: Self-pay | Admitting: Certified Registered Nurse Anesthetist

## 2016-04-22 ENCOUNTER — Emergency Department (HOSPITAL_COMMUNITY): Payer: Medicare Other | Admitting: Certified Registered Nurse Anesthetist

## 2016-04-22 ENCOUNTER — Encounter (HOSPITAL_COMMUNITY)
Admission: EM | Disposition: A | Discharge status: Home / Self Care | Payer: Self-pay | Source: Home / Self Care | Attending: Emergency Medicine

## 2016-04-22 DIAGNOSIS — R911 Solitary pulmonary nodule: Secondary | ICD-10-CM | POA: Diagnosis not present

## 2016-04-22 DIAGNOSIS — K353 Acute appendicitis with localized peritonitis: Secondary | ICD-10-CM | POA: Diagnosis not present

## 2016-04-22 DIAGNOSIS — K37 Unspecified appendicitis: Secondary | ICD-10-CM | POA: Diagnosis present

## 2016-04-22 DIAGNOSIS — L930 Discoid lupus erythematosus: Secondary | ICD-10-CM | POA: Diagnosis not present

## 2016-04-22 DIAGNOSIS — K589 Irritable bowel syndrome without diarrhea: Secondary | ICD-10-CM | POA: Diagnosis not present

## 2016-04-22 HISTORY — PX: LAPAROSCOPIC APPENDECTOMY: SHX408

## 2016-04-22 SURGERY — APPENDECTOMY, LAPAROSCOPIC
Anesthesia: General | Site: Abdomen

## 2016-04-22 MED ORDER — MORPHINE SULFATE (PF) 4 MG/ML IV SOLN: 1.0000 mg | INTRAVENOUS | Status: DC | PRN

## 2016-04-22 MED ORDER — PHENYLEPHRINE 40 MCG/ML (10ML) SYRINGE FOR IV PUSH (FOR BLOOD PRESSURE SUPPORT)
PREFILLED_SYRINGE | INTRAVENOUS | Status: DC | PRN
  Administered 2016-04-22: 80 ug via INTRAVENOUS

## 2016-04-22 MED ORDER — ONDANSETRON HCL 4 MG/2ML IJ SOLN
INTRAMUSCULAR | Status: AC
  Filled 2016-04-22: qty 2

## 2016-04-22 MED ORDER — KCL IN DEXTROSE-NACL 20-5-0.45 MEQ/L-%-% IV SOLN
INTRAVENOUS | Status: DC
  Administered 2016-04-22: 1000 mL via INTRAVENOUS
  Filled 2016-04-22 (×2): qty 1000

## 2016-04-22 MED ORDER — POLYMYXIN B-TRIMETHOPRIM 10000-0.1 UNIT/ML-% OP SOLN
1.0000 [drp] | Freq: Three times a day (TID) | OPHTHALMIC | Status: DC
  Administered 2016-04-22: 1 [drp] via OPHTHALMIC
  Filled 2016-04-22: qty 10

## 2016-04-22 MED ORDER — ONDANSETRON 4 MG PO TBDP: 4.0000 mg | ORAL_TABLET | Freq: Four times a day (QID) | ORAL | Status: DC | PRN

## 2016-04-22 MED ORDER — FENTANYL CITRATE (PF) 100 MCG/2ML IJ SOLN: 25.0000 ug | INTRAMUSCULAR | Status: DC | PRN

## 2016-04-22 MED ORDER — KETOROLAC TROMETHAMINE 0.5 % OP SOLN: 1.0000 [drp] | Freq: Three times a day (TID) | OPHTHALMIC | 0 refills | Status: DC | PRN

## 2016-04-22 MED ORDER — IBUPROFEN 200 MG PO TABS
600.0000 mg | ORAL_TABLET | Freq: Four times a day (QID) | ORAL | Status: DC | PRN
  Administered 2016-04-22: 600 mg via ORAL
  Filled 2016-04-22: qty 3

## 2016-04-22 MED ORDER — BUPIVACAINE-EPINEPHRINE 0.25% -1:200000 IJ SOLN
INTRAMUSCULAR | Status: DC | PRN
  Administered 2016-04-22: 30 mL

## 2016-04-22 MED ORDER — ONDANSETRON HCL 4 MG/2ML IJ SOLN: 4.0000 mg | Freq: Four times a day (QID) | INTRAMUSCULAR | Status: DC | PRN

## 2016-04-22 MED ORDER — ONDANSETRON HCL 4 MG/2ML IJ SOLN
INTRAMUSCULAR | Status: DC | PRN
  Administered 2016-04-22: 4 mg via INTRAVENOUS

## 2016-04-22 MED ORDER — KETOROLAC TROMETHAMINE 0.5 % OP SOLN
1.0000 [drp] | Freq: Three times a day (TID) | OPHTHALMIC | Status: DC | PRN
  Administered 2016-04-22: 1 [drp] via OPHTHALMIC
  Filled 2016-04-22: qty 3

## 2016-04-22 MED ORDER — PIPERACILLIN-TAZOBACTAM 3.375 G IVPB
3.3750 g | Freq: Three times a day (TID) | INTRAVENOUS | Status: DC
  Administered 2016-04-22 (×2): 3.375 g via INTRAVENOUS
  Filled 2016-04-22 (×3): qty 50

## 2016-04-22 MED ORDER — PROPOFOL 10 MG/ML IV BOLUS
INTRAVENOUS | Status: DC | PRN
  Administered 2016-04-22: 140 mg via INTRAVENOUS

## 2016-04-22 MED ORDER — POLYMYXIN B-TRIMETHOPRIM 10000-0.1 UNIT/ML-% OP SOLN: 1.0000 [drp] | Freq: Three times a day (TID) | OPHTHALMIC | 0 refills | Status: DC

## 2016-04-22 MED ORDER — ALBUTEROL SULFATE HFA 108 (90 BASE) MCG/ACT IN AERS
INHALATION_SPRAY | RESPIRATORY_TRACT | Status: AC
  Filled 2016-04-22: qty 6.7

## 2016-04-22 MED ORDER — LACTATED RINGERS IV SOLN
INTRAVENOUS | Status: DC | PRN
  Administered 2016-04-22 (×2): via INTRAVENOUS

## 2016-04-22 MED ORDER — ACETAMINOPHEN 325 MG PO TABS: ORAL_TABLET | ORAL | Status: DC

## 2016-04-22 MED ORDER — ALBUTEROL SULFATE HFA 108 (90 BASE) MCG/ACT IN AERS
INHALATION_SPRAY | RESPIRATORY_TRACT | Status: DC | PRN
  Administered 2016-04-22: 4 via RESPIRATORY_TRACT

## 2016-04-22 MED ORDER — IBUPROFEN 200 MG PO TABS: ORAL_TABLET | ORAL | Status: DC

## 2016-04-22 MED ORDER — BUPIVACAINE HCL (PF) 0.25 % IJ SOLN
INTRAMUSCULAR | Status: AC
  Filled 2016-04-22: qty 30

## 2016-04-22 MED ORDER — FENTANYL CITRATE (PF) 100 MCG/2ML IJ SOLN
INTRAMUSCULAR | Status: DC | PRN
  Administered 2016-04-22: 100 ug via INTRAVENOUS
  Administered 2016-04-22 (×3): 50 ug via INTRAVENOUS

## 2016-04-22 MED ORDER — LACTATED RINGERS IR SOLN
Status: DC | PRN
  Administered 2016-04-22: 1000 mL

## 2016-04-22 MED ORDER — HEPARIN SODIUM (PORCINE) 5000 UNIT/ML IJ SOLN
5000.0000 [IU] | Freq: Three times a day (TID) | INTRAMUSCULAR | Status: DC
  Administered 2016-04-22: 5000 [IU] via SUBCUTANEOUS
  Filled 2016-04-22: qty 1

## 2016-04-22 MED ORDER — SUGAMMADEX SODIUM 200 MG/2ML IV SOLN
INTRAVENOUS | Status: DC | PRN
  Administered 2016-04-22: 200 mg via INTRAVENOUS

## 2016-04-22 MED ORDER — BSS IO SOLN
15.0000 mL | Freq: Once | INTRAOCULAR | Status: AC
  Administered 2016-04-22: 15 mL
  Filled 2016-04-22: qty 15

## 2016-04-22 MED ORDER — PHENYLEPHRINE 40 MCG/ML (10ML) SYRINGE FOR IV PUSH (FOR BLOOD PRESSURE SUPPORT)
PREFILLED_SYRINGE | INTRAVENOUS | Status: AC
  Filled 2016-04-22: qty 20

## 2016-04-22 MED ORDER — MIDAZOLAM HCL 5 MG/5ML IJ SOLN
INTRAMUSCULAR | Status: DC | PRN
  Administered 2016-04-22: 2 mg via INTRAVENOUS

## 2016-04-22 MED ORDER — HYDROCODONE-ACETAMINOPHEN 5-325 MG PO TABS: 1.0000 | ORAL_TABLET | ORAL | 0 refills | Status: DC | PRN

## 2016-04-22 MED ORDER — SODIUM CHLORIDE 0.9 % IR SOLN
Status: DC | PRN
  Administered 2016-04-22: 1000 mL

## 2016-04-22 MED ORDER — HYDROCODONE-ACETAMINOPHEN 5-325 MG PO TABS: 1.0000 | ORAL_TABLET | ORAL | Status: DC | PRN

## 2016-04-22 MED ORDER — SUGAMMADEX SODIUM 200 MG/2ML IV SOLN
INTRAVENOUS | Status: AC
  Filled 2016-04-22: qty 2

## 2016-04-22 MED ORDER — ROCURONIUM BROMIDE 10 MG/ML (PF) SYRINGE
PREFILLED_SYRINGE | INTRAVENOUS | Status: DC | PRN
  Administered 2016-04-22: 50 mg via INTRAVENOUS

## 2016-04-22 MED ORDER — LIDOCAINE 2% (20 MG/ML) 5 ML SYRINGE
INTRAMUSCULAR | Status: DC | PRN
  Administered 2016-04-22: 50 mg via INTRAVENOUS

## 2016-04-22 SURGICAL SUPPLY — 40 items
ADH SKN CLS APL DERMABOND .7 (GAUZE/BANDAGES/DRESSINGS) ×1
APL SKNCLS STERI-STRIP NONHPOA (GAUZE/BANDAGES/DRESSINGS)
APPLIER CLIP ROT 10 11.4 M/L (STAPLE)
APR CLP MED LRG 11.4X10 (STAPLE)
BAG SPEC RTRVL LRG 6X4 10 (ENDOMECHANICALS) ×1
BENZOIN TINCTURE PRP APPL 2/3 (GAUZE/BANDAGES/DRESSINGS) IMPLANT
CABLE HIGH FREQUENCY MONO STRZ (ELECTRODE) ×2 IMPLANT
CHLORAPREP W/TINT 26ML (MISCELLANEOUS) ×2 IMPLANT
CLIP APPLIE ROT 10 11.4 M/L (STAPLE) IMPLANT
COVER SURGICAL LIGHT HANDLE (MISCELLANEOUS) ×2 IMPLANT
CUTTER FLEX LINEAR 45M (STAPLE) IMPLANT
DECANTER SPIKE VIAL GLASS SM (MISCELLANEOUS) ×2 IMPLANT
DERMABOND ADVANCED (GAUZE/BANDAGES/DRESSINGS) ×1
DERMABOND ADVANCED .7 DNX12 (GAUZE/BANDAGES/DRESSINGS) ×1 IMPLANT
DRAPE LAPAROSCOPIC ABDOMINAL (DRAPES) ×2 IMPLANT
ELECT REM PT RETURN 9FT ADLT (ELECTROSURGICAL) ×2
ELECTRODE REM PT RTRN 9FT ADLT (ELECTROSURGICAL) ×1 IMPLANT
ENDOLOOP SUT PDS II  0 18 (SUTURE)
ENDOLOOP SUT PDS II 0 18 (SUTURE) IMPLANT
GLOVE SURG SIGNA 7.5 PF LTX (GLOVE) ×2 IMPLANT
GOWN STRL REUS W/TWL XL LVL3 (GOWN DISPOSABLE) ×4 IMPLANT
IRRIG SUCT STRYKERFLOW 2 WTIP (MISCELLANEOUS) ×2
IRRIGATION SUCT STRKRFLW 2 WTP (MISCELLANEOUS) ×1 IMPLANT
KIT BASIN OR (CUSTOM PROCEDURE TRAY) ×2 IMPLANT
POUCH SPECIMEN RETRIEVAL 10MM (ENDOMECHANICALS) ×2 IMPLANT
RELOAD 45 VASCULAR/THIN (ENDOMECHANICALS) IMPLANT
RELOAD STAPLE 45 2.5 WHT GRN (ENDOMECHANICALS) IMPLANT
RELOAD STAPLE 45 3.5 BLU ETS (ENDOMECHANICALS) IMPLANT
RELOAD STAPLE TA45 3.5 REG BLU (ENDOMECHANICALS) IMPLANT
SCISSORS LAP 5X35 DISP (ENDOMECHANICALS) ×2 IMPLANT
SHEARS HARMONIC ACE PLUS 36CM (ENDOMECHANICALS) ×2 IMPLANT
SLEEVE XCEL OPT CAN 5 100 (ENDOMECHANICALS) ×2 IMPLANT
STRIP CLOSURE SKIN 1/2X4 (GAUZE/BANDAGES/DRESSINGS) IMPLANT
SUT MNCRL AB 4-0 PS2 18 (SUTURE) ×2 IMPLANT
SUT VIC AB 2-0 SH 18 (SUTURE) IMPLANT
TOWEL OR 17X26 10 PK STRL BLUE (TOWEL DISPOSABLE) ×2 IMPLANT
TOWEL OR NON WOVEN STRL DISP B (DISPOSABLE) ×2 IMPLANT
TRAY LAPAROSCOPIC (CUSTOM PROCEDURE TRAY) ×2 IMPLANT
TROCAR BLADELESS OPT 5 100 (ENDOMECHANICALS) ×2 IMPLANT
TROCAR XCEL BLUNT TIP 100MML (ENDOMECHANICALS) ×2 IMPLANT

## 2016-04-22 NOTE — Progress Notes (Signed)
Called by Gen Surg. PA, with patient having symptoms of grit in her right eye, suggestive of a corneal abrasion.  Visited the patient and her husband. Pt doing well post surgery. VSS. Eye unremarkable. Plan: Tordol and antibiotic drops If she goes home she will continue x 24hrs and call us if it persists.  Lillia Abed MD

## 2016-04-22 NOTE — ED Notes (Signed)
Surgeon speaking with patient and husband.

## 2016-04-22 NOTE — Discharge Instructions (Signed)
[facility] ______[facility] °LAPAROSCOPIC SURGERY: POST OP INSTRUCTIONS °Always review your discharge instruction sheet given to you by the facility where your surgery was performed. °IF YOU HAVE DISABILITY OR FAMILY LEAVE FORMS, YOU MUST BRING THEM TO THE OFFICE FOR PROCESSING.   °DO NOT GIVE THEM TO YOUR DOCTOR. ° °1. A prescription for pain medication may be given to you upon discharge.  Take your pain medication as prescribed, if needed.  If narcotic pain medicine is not needed, then you may take acetaminophen (Tylenol) or ibuprofen (Advil) as needed. °2. Take your usually prescribed medications unless otherwise directed. °3. If you need a refill on your pain medication, please contact your pharmacy.  They will contact our office to request authorization. Prescriptions will not be filled after 5pm or on week-ends. °4. You should follow a light diet the first few days after arrival home, such as soup and crackers, etc.  Be sure to include lots of fluids daily. °5. Most patients will experience some swelling and bruising in the area of the incisions.  Ice packs will help.  Swelling and bruising can take several days to resolve.  °6. It is common to experience some constipation if taking pain medication after surgery.  Increasing fluid intake and taking a stool softener (such as Colace) will usually help or prevent this problem from occurring.  A mild laxative (Milk of Magnesia or Miralax) should be taken according to package instructions if there are no bowel movements after 48 hours. °7. Unless discharge instructions indicate otherwise, you may remove your bandages 24-48 hours after surgery, and you may shower at that time.  You may have steri-strips (small skin tapes) in place directly over the incision.  These strips should be left on the skin for 7-10 days.  If your surgeon used skin glue on the incision, you may shower in 24 hours.  The glue will flake off over the next 2-3 weeks.  Any sutures or  staples will be removed at the office during your follow-up visit. °8. ACTIVITIES:  You may resume regular (light) daily activities beginning the next day--such as daily self-care, walking, climbing stairs--gradually increasing activities as tolerated.  You may have sexual intercourse when it is comfortable.  Refrain from any heavy lifting or straining until approved by your doctor. °a. You may drive when you are no longer taking prescription pain medication, you can comfortably wear a seatbelt, and you can safely maneuver your car and apply brakes. °b. RETURN TO WORK:  __________________________________________________________ °9. You should see your doctor in the office for a follow-up appointment approximately 2-3 weeks after your surgery.  Make sure that you call for this appointment within a day or two after you arrive home to insure a convenient appointment time. °10. OTHER INSTRUCTIONS: __________________________________________________________________________________________________________________________ __________________________________________________________________________________________________________________________ °WHEN TO CALL YOUR DOCTOR: °1. Fever over 101.0 °2. Inability to urinate °3. Continued bleeding from incision. °4. Increased pain, redness, or drainage from the incision. °5. Increasing abdominal pain ° °The clinic staff is available to answer your questions during regular business hours.  Please don’t hesitate to call and ask to speak to one of the nurses for clinical concerns.  If you have a medical emergency, go to the nearest emergency room or call 911.  A surgeon from [facility] is always on call at the hospital. °[address], [address], [place], [place]  [address] ? [address] [address], [place], [place]   [address] °[phone] ? [phone] ? FAX [phone] °Web site:   [email or web address] ° ° °Laparoscopic Appendectomy, Adult, Care After °Refer to  this sheet in the next few weeks. These instructions provide you with information about caring for yourself after your procedure. Your health care provider may also give you more specific instructions. Your treatment has been planned according to current medical practices, but problems sometimes occur. Call your health care provider if you have any problems or questions after your procedure. °What can I expect after the procedure? °After the procedure, it is common to have: °· A decrease in your energy level. °· Mild pain in the area where the surgical cuts (incisions) were made. °· Constipation. This can be caused by pain medicine and a decrease in your activity. °Follow these instructions at home: °Medicines  °· Take over-the-counter and prescription medicines only as told by your health care provider. °· Do not drive for 24 hours if you received a sedative. °· Do not drive or operate heavy machinery while taking prescription pain medicine. °· If you were prescribed an antibiotic medicine, take it as told by your health care provider. Do not stop taking the antibiotic even if you start to feel better. °Activity  °· For 3 weeks or as long as told by your health care provider: °¨ Do not lift anything that is heavier than 10 pounds (4.5 kg). °¨ Do not play contact sports. °· Gradually return to your normal activities. Ask your health care provider what activities are safe for you. °Bathing  °· Keep your incisions clean and dry. Clean them as often as told by your health care provider: °¨ Gently wash the incisions with soap and water. °¨ Rinse the incisions with water to remove all soap. °¨ Pat the incisions dry with a clean towel. Do not rub the incisions. °· You may take showers after 48 hours. °· Do not take baths, swim, or use hot tubs for 2 weeks or as told by your health care provider. °Incision care  °· Follow instructions from your healthcare provider about how to take care of your incisions. Make sure  you: °¨ Wash your hands with soap and water before you change your bandage (dressing). If soap and water are not available, use hand sanitizer. °¨ Change your dressing as told by your health care provider. °¨ Leave stitches (sutures), skin glue, or adhesive strips in place. These skin closures may need to stay in place for 2 weeks or longer. If adhesive strip edges start to loosen and curl up, you may trim the loose edges. Do not remove adhesive strips completely unless your health care provider tells you to do that. °· Check your incision areas every day for signs of infection. Check for: °¨ More redness, swelling, or pain. °¨ More fluid or blood. °¨ Warmth. °¨ Pus or a bad smell. °Other Instructions  °· If you were sent home with a drain, follow instructions from your health care provider about how to care for the drain and how to empty it. °· Take deep breaths. This helps to prevent your lungs from becoming inflamed. °· To relieve and prevent constipation: °¨ Drink plenty of fluids. °¨ Eat plenty of fruits and vegetables. °· Keep all follow-up visits as told by your health care provider. This is important. °Contact a health care provider if: °· You have more redness, swelling, or pain around an incision. °· You have more fluid or blood coming from an incision. °· Your incision feels warm to the touch. °· You have pus or a bad smell coming   from an incision or dressing. °· Your incision edges break open after your sutures have been removed. °· You have increasing pain in your shoulders. °· You feel dizzy or you faint. °· You develop shortness of breath. °· You keep feeling nauseous or vomiting. °· You have diarrhea or you cannot control your bowel functions. °· You lose your appetite. °· You develop swelling or pain in your legs. °Get help right away if: °· You have a fever. °· You develop a rash. °· You have difficulty breathing. °· You have sharp pains in your chest. °This information is not intended to replace  advice given to you by your health care provider. Make sure you discuss any questions you have with your health care provider. °Document Released: 02/24/2005 Document Revised: 07/27/2015 Document Reviewed: 08/14/2014 °Elsevier Interactive Patient Education © 2017 [employer] Inc[employer] ° °

## 2016-04-22 NOTE — Anesthesia Preprocedure Evaluation (Addendum)
Anesthesia Evaluation  Patient identified by MRN, date of birth, ID band Patient awake    Reviewed: Allergy & Precautions, H&P , Patient's Chart, lab work & pertinent test results, reviewed documented beta blocker date and time   Airway Mallampati: II  TM Distance: >3 FB Neck ROM: full    Dental no notable dental hx.    Pulmonary Current Smoker,    Pulmonary exam normal breath sounds clear to auscultation       Cardiovascular  Rhythm:regular Rate:Normal     Neuro/Psych    GI/Hepatic   Endo/Other    Renal/GU      Musculoskeletal   Abdominal   Peds  Hematology   Anesthesia Other Findings   Reproductive/Obstetrics                             Anesthesia Physical Anesthesia Plan  ASA: II and emergent  Anesthesia Plan: General   Post-op Pain Management:    Induction: Intravenous, Rapid sequence and Cricoid pressure planned  Airway Management Planned: Oral ETT  Additional Equipment:   Intra-op Plan:   Post-operative Plan: Extubation in OR  Informed Consent: I have reviewed the patients History and Physical, chart, labs and discussed the procedure including the risks, benefits and alternatives for the proposed anesthesia with the patient or authorized representative who has indicated his/her understanding and acceptance.   Dental Advisory Given and Dental advisory given  Plan Discussed with: CRNA and Surgeon  Anesthesia Plan Comments: (  Discussed general anesthesia, including possible nausea, instrumentation of airway, sore throat,pulmonary aspiration, etc. I asked if the were any outstanding questions, or  concerns before we proceeded.)       Anesthesia Quick Evaluation

## 2016-04-22 NOTE — Progress Notes (Signed)
Day of Surgery  Subjective: She is doing fine from her surgery. No significant complaints. She has some conjunctivitis and it feels like there is something in her right eye. Left eye is fine.  Objective: Vital signs in last 24 hours: Temp:  [97.5 F (36.4 C)-98.6 F (37 C)] 98.6 F (37 C) (02/13 0606) Pulse Rate:  [76-100] 100 (02/13 0606) Resp:  [12-20] 18 (02/13 0606) BP: (99-144)/(55-77) 141/68 (02/13 0606) SpO2:  [96 %-100 %] 100 % (02/13 0606) Weight:  [73.5 kg (162 lb)] 73.5 kg (162 lb) (02/12 1738) Last BM Date:  (PTA) NPO so far 1200 IV 400 urine Afebrile, VSS No labs  Intake/Output from previous day: 02/12 0701 - 02/13 0700 In: 1301.3 [I.V.:1151.3; IV Piggyback:50] Out: 425 [Urine:400; Blood:25] Intake/Output this shift: No intake/output data recorded.  General appearance: alert, cooperative, no distress and Washcloth over the right eye. Eyes: Right eye is red with some conjunctival redness. Very uncomfortable. Resp: clear to auscultation bilaterally GI: Soft, sore, sites all look fine.  Lab Results:   Recent Labs  04/21/16 1944  WBC 12.0*  HGB 13.6  HCT 39.0  PLT 174    BMET  Recent Labs  04/21/16 1944  NA 136  K 3.5  CL 105  CO2 21*  GLUCOSE 115*  BUN 10  CREATININE 0.72  CALCIUM 8.7*   PT/INR No results for input(s): LABPROT, INR in the last 72 hours.   Recent Labs Lab 04/21/16 1944  AST 29  ALT 20  ALKPHOS 81  BILITOT 0.8  PROT 6.7  ALBUMIN 4.0     Lipase     Component Value Date/Time   LIPASE 22 04/21/2016 1944     Studies/Results: Dg Chest 2 View  Result Date: 04/21/2016 CLINICAL DATA:  Cough/bronchitis x 3 weeks, flu-like symptoms recently, also feels like "stomach bug." EXAM: CHEST - 2 VIEW COMPARISON:  10/28/2012 FINDINGS: Lungs are clear. Heart size and mediastinal contours are within normal limits. No effusion. Visualized bones unremarkable. IMPRESSION: No acute cardiopulmonary disease. Electronically Signed   By:  Lucrezia Europe M.D.   On: 04/21/2016 20:07   Ct Abdomen Pelvis W Contrast  Result Date: 04/21/2016 CLINICAL DATA:  Fever, abdominal pain and diarrhea EXAM: CT ABDOMEN AND PELVIS WITH CONTRAST TECHNIQUE: Multidetector CT imaging of the abdomen and pelvis was performed using the standard protocol following bolus administration of intravenous contrast. CONTRAST:  162mL ISOVUE-300 IOPAMIDOL (ISOVUE-300) INJECTION 61% COMPARISON:  None. FINDINGS: Lower chest: 4 mm left lower lobe pulmonary nodule. No pulmonary consolidation or effusion. Mild fatty change noted about the visualized left atrium. Hepatobiliary: Cholecystectomy. Mild fatty infiltration of the liver without space-occupying mass. No biliary dilatation. Pancreas: Normal Spleen: Normal Adrenals/Urinary Tract: Normal bilateral adrenal glands. Duplicated right renal collecting system. No obstructive uropathy or nephrolithiasis. Unremarkable bladder. Stomach/Bowel: 11 mm in diameter appendix with periappendiceal inflammation consistent with acute appendicitis without complication. The appendix is seen in the right hemi pelvis overlying the right external iliac artery and vein. Colonic diverticulosis without acute diverticulitis most significantly affecting the distal descending and sigmoid colon. Vascular/Lymphatic: Aortoiliac atherosclerosis without aneurysm. No lymphadenopathy. Reproductive: Hysterectomy.  No adnexal mass. Other: No free air.  No abscess collections. Musculoskeletal: [place] are degenerative disc disease L5-S1. No acute osseous abnormality. Lower lumbar facet arthropathy. IMPRESSION: 1. Acute uncomplicated appendicitis with the appendix measuring up to 11 mm in diameter. 2. 4 mm left lower lobe pulmonary nodule. No follow-up needed if patient is low-risk. Non-contrast chest CT can be considered in 12 months  if patient is high-risk. This recommendation follows the consensus statement: Guidelines for Management of Incidental Pulmonary Nodules Detected  on CT Images: From the Fleischner Society 2017; Radiology 2017; 284:228-243. 3. Hepatic steatosis. 4. Hysterectomy and cholecystectomy. Electronically Signed   By: Ashley Royalty M.D.   On: 04/21/2016 21:32   Prior to Admission medications   Medication Sig Start Date End Date Taking? Authorizing Provider  calcium carbonate (TUMS) 500 MG chewable tablet Chew 1 tablet by mouth as needed for indigestion or heartburn.   Yes Historical Provider, MD  hydroxychloroquine (PLAQUENIL) 200 MG tablet Take 200 mg by mouth 2 (two) times daily.    Yes Historical Provider, MD  ibuprofen (ADVIL) 200 MG tablet Take 400 mg by mouth every 6 (six) hours as needed for moderate pain.   Yes Historical Provider, MD    Medications: . heparin subcutaneous  5,000 Units Subcutaneous Q8H  . piperacillin-tazobactam (ZOSYN)  IV  3.375 g Intravenous Q8H   . dextrose 5 % and 0.45 % NaCl with KCl 20 mEq/L 1,000 mL (04/22/16 0333)   Assessment/Plan Acute purulent appendicitis Status post laparoscopic appendectomy, 04/22/16, Dr. Alphonsa Overall Possible Corneal abrasion   Ongoing tobacco use History of lupus erythematosus - followed by Dr. Ubaldo Glassing 4 mm left lower lobe nodule FEN:  IV fluids/regular diet ID: Zosyn 04/21/16 =>> Day 2 DVT:  heparin   Plan: She probably has this corneal scratch or abrasion. Anesthesia has been notified and they have a protocol for this. Dr. Maryann Conners said they might have an ophthalmologist look at her tomorrow if she still here. We will reevaluate later this afternoon. If she does well from a surgical standpoint we will aim to discharge her home later today if possible.  Anesthesia has seen her irrigated her eye and she feels much better.  She has an appointment with Dr. Wyatt Portela tomorrow AM at 10. She is not taking much for pain, tolerated her diet and wants to go home.    Will discharge home now.  LOS: 0 days    Alby Schwabe 04/22/2016 [phone]

## 2016-04-22 NOTE — H&P (Signed)
Re:   Ashlee Allen DOB:   1950-04-02 MRN:   [id number]   [name] Admission  ASSESSMENT AND PLAN: 1.  Appendicitis  I discussed with the patient the indications and risks of appendiceal surgery.  The primary risks of appendiceal surgery include, but are not limited to, bleeding, infection, bowel surgery, and open surgery.  There is also the risk that the patient may have continued symptoms after surgery.  We discussed the typical post-operative recovery course. I tried to answer the patient's questions.  2.  Smokes 3.  Lupus erythematosus, discoid  Followed by Dr.Lomax 4.  4 mm LLL lung nodule seen on CT scan   Discussed with patient and copy of CT scan given to husband.  Will need follow up 5.  Cough for 4 weeks   Chief Complaint  Patient presents with  . Fever  . Abdominal Pain   PHYSICIAN REQUESTING CONSULTATION: Dr. Anastasia Fiedler, [facility] [place]  HISTORY OF PRESENT ILLNESS: Ashlee Allen is a 66 y.o. (DOB: Apr 15, 1950)  white female whose primary care physician is Elsie Stain, MD and comes to [facility] for abdominal pain. She is accompanied by her husband.  She has IBS, but really sees no one for this.  She developed diffuse abdominal pain early Monday, 2/12.  She has some diarrhea, but has this some with her IBS.  As the day went on, the pain in her abdomen localized to the RLQ.  With the continued and more focused pain, she went to [place].  Prior history of open cholecystectomy - [place], and hysterectomy - [place].  She had a limited colonoscopy in 2016 by Dr. Amedeo Plenty - he could not advance the scope the entire way - so she had a BE to complete the study.  CT scan of abdomen/pelvis - 04/21/2016 - 1. Acute uncomplicated appendicitis with the appendix measuring up to 11 mm in diameter.  2. 4 mm left lower lobe pulmonary nodule. No follow-up needed if patient is low-risk. Non-contrast chest CT can be considered in  12 months if patient is high-risk.   3. Hepatic steatosis.  4. Hysterectomy and cholecystectomy.  WBC - 04/21/2016 - 12,000    Past Medical History:  Diagnosis Date  . Cardiac murmur    echo 8-9 yr ago-not sure where-no tx  . Depression   . Discoid lupus    Dr. Kalman Shan (prev seen at [facility]) and Dr. Ubaldo Glassing  . Fibroid   . GERD (gastroesophageal reflux disease)    controllled with weight loss       Past Surgical History:  Procedure Laterality Date  . ABDOMINAL HYSTERECTOMY  1997   [facility]  [place]  . BREAST SURGERY  2001   Duct excised-rt  . CATARACT EXTRACTION, BILATERAL    . CHOLECYSTECTOMY  1991  . COLONOSCOPY    . HAND SURGERY  2001   ctr-both  . INNER EAR SURGERY     left- mastoidectomy  . KNEE ARTHROSCOPY Left 05/20/2013   Procedure: LEFT KNEE ARTHROSCOPY WITH DEBRIDEMENT/SHAVING (CHONDROPLASTY) AND LATERAL AND MEDIAL MENISECTOMY, EXCISION OF PLICA;  Surgeon: Yvette Rack., MD;  Location: [facility];  Service: Orthopedics;  Laterality: Left;      Current Facility-Administered Medications  Medication Dose Route Frequency Provider Last Rate Last Dose  . fentaNYL (SUBLIMAZE) injection 50 mcg  50 mcg Intravenous Q1H PRN Leo Grosser, MD   50 mcg at 04/21/16 2221  .  ondansetron (ZOFRAN) injection 4 mg  4 mg Intravenous Q1H PRN Leo Grosser, MD   4 mg at 04/21/16 2221   Current Outpatient Prescriptions  Medication Sig Dispense Refill  . calcium carbonate (TUMS) 500 MG chewable tablet Chew 1 tablet by mouth as needed for indigestion or heartburn.    . hydroxychloroquine (PLAQUENIL) 200 MG tablet Take 200 mg by mouth 2 (two) times daily.     Marland Kitchen ibuprofen (ADVIL) 200 MG tablet Take 400 mg by mouth every 6 (six) hours as needed for moderate pain.        Allergies  Allergen Reactions  . Latex   . 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   No history of MRSA. Neurologic:  No history of stroke.  No history of seizure.  No history of headaches. Cardiac:  No history of hypertension. No history of heart disease.   No history of seeing a cardiologist. Pulmonary:  Smokes.  Knows it is bad for her health.  Endocrine:  No diabetes. No thyroid disease. Gastrointestinal:  See HPI Urologic:  No history of kidney stones.  No history of bladder infections. Musculoskeletal:  Left knee arthroscopy - D. Caffrey - 2015 Hematologic:  No bleeding disorder.  No history of anemia.  Not anticoagulated. Psycho-social:  The patient is oriented.   The patient has no obvious psychologic or social impairment to understanding our conversation and plan.  SOCIAL and FAMILY HISTORY: Married. Husband with her. She has two children: 4 and 32 yo. 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  No family history of appendicitis.  PHYSICAL EXAM: BP 102/55 (BP Location: Right Arm)   Pulse 90   Temp 98.2 F (36.8 C) (Oral)   Resp 18   Ht 5' 5.5" (1.664 m)   Wt 73.5 kg (162 lb)   SpO2 100%   BMI 26.55 kg/m   General: WN older WF who is alert and generally healthy appearing.  Skin:  Inspection and palpation - unremarkable. Eyes:  Conjunctiva and lids unremarkable.            Pupils are equal Ears, Nose, Mouth, and Throat:  Ears and nose unremarkable            Lips and teeth are unremarable. Neck: Supple. No mass, trachea midline.  No thyroid mass. Lymph Nodes:  No supraclavicular, cervical, or inguinal nodes. Lungs: Normal respiratory effort.  Clear to auscultation and symmetric breath sounds. Heart:  Palpation of the heart is normal.            Auscultation: RRR. No murmur or rub.  Abdomen: Soft. No mass.  No hernia.             Liver:  Edge at right costal margin.            Normal bowel sounds.  RUQ scar and pfannenstiel scar.  RLQ pain.  No rebound Rectal: Not done. Musculoskeletal:  Digits and nails  are unremarkable.            Good muscle strength and ROM  in upper and lower extremities. Neurologic:  Grossly intact to motor and sensory function. Psychiatric: Normal judgement and insight. Behavior is normal.            Oriented to time, person, place.   DATA REVIEWED, COUNSELING  AND COORDINATION OF CARE: Epic notes reviewed. Counseling and coordination of care exceeded more than 50% of the time spent with patient. Total time spent with patient and charting: 30 minutes.  Alphonsa Overall, MD,  [facility], [address]  [place], [place]    [place] Phone:  [phone] FAX:  [phone]

## 2016-04-22 NOTE — Discharge Summary (Signed)
Physician Discharge Summary  Patient ID: Ashlee Allen MRN: [id number] DOB/AGE: [birth year] 67 y.o.  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 Elby Beck Course:  66 y.o. (DOB: March 14, 1950)  white female whose primary care physician is Elsie Stain, MD and comes to [facility] 66 y.o. today for abdominal pain. She is accompanied by her husband.  She has IBS, but really sees no one for this.  She developed diffuse abdominal pain early Monday, 2/12.  She has some diarrhea, but has this some with her IBS.  As the day went on, the pain in her abdomen localized to the RLQ.  With the continued and more focused pain, she went to [place].  Prior history of open cholecystectomy - [place], and hysterectomy - [place].  She had a limited colonoscopy in 2016 by Dr. Amedeo Plenty - he could not advance the scope the entire way - so she had a BE to complete the study.  CT scan of abdomen/pelvis - 04/21/2016 - 1. Acute uncomplicated appendicitis with the appendix measuring up to 11 mm in diameter.             2. 4 mm left lower lobe pulmonary nodule. No follow-up needed if patient is low-risk. Non-contrast chest CT can be considered in 12 months if patient is high-risk.              3. Hepatic steatosis.             4. Hysterectomy and cholecystectomy. WBC on admission 12,000  Patient was seen in the ED and taken to the operating room by Dr. Alphonsa Overall. She underwent appendectomy. She tolerated this well. She was returned to the floor after surgery. She developed some pain in her  right eye with some conjunctival injection noted in the right eye. Anesthesia was contacted and irrigated her and then placed her on Acular ophthalmic drops, and trimethoprim-polymyxin B ophthalmic drops. After irrigation with balanced salt ophthalmic solution and eyedrops her pain has improved significantly. She's been ambulating well. Her pain is well-controlled with mostly Tylenol and ibuprofen. She tolerated lunch well. She would like to go home this afternoon. She has a follow-up appointment with Dr. Wyatt Portela ophthalmology tomorrow morning at 10 AM. We'll plan to discharge her home. She is to follow-up with her PCP for the 4 mm left lower lobe pulmonary nodule. We will encourage her to discontinue tobacco use.  Follow-up will be in our office in 2-3 weeks. She is to follow-up with her PCP and Dr. Katy Fitch for her right corneal abrasion.  Condition ON discharge: Improving.  CBC Latest Ref Rng & Units 04/21/2016 05/20/2013 01/15/2010  WBC 4.0 - 10.5 K/uL 12.0(H) - 6.5  Hemoglobin 12.0 - 15.0 g/dL 13.6 14.2 14.3  Hematocrit 36.0 - 46.0 % 39.0 - 41.1  Platelets 150 - 400 K/uL 174 - 150.0   CMP Latest Ref Rng & Units 04/21/2016 12/19/2015 12/05/2014  Glucose 65 - 99 mg/dL 115(H) 87 90  BUN 6 - 20 mg/dL 10 16 15   Creatinine 0.44 - 1.00 mg/dL 0.72 0.84 0.85  Sodium 135 - 145 mmol/L  136 141 141  Potassium 3.5 - 5.1 mmol/L 3.5 4.2 4.4  Chloride 101 - 111 mmol/L 105 106 106  CO2 22 - 32 mmol/L 21(L) 28 27  Calcium 8.9 - 10.3 mg/dL 8.7(L) 9.3 9.4  Total Protein 6.5 - 8.1 g/dL 6.7 7.0 6.3  Total Bilirubin 0.3 - 1.2 mg/dL 0.8 0.4 0.4  Alkaline Phos 38 - 126 U/L 81 81 88  AST 15 - 41 U/L 29 19 17   ALT 14 - 54 U/L 20 22 18       Disposition: 01-Home or Self Care   Allergies as of 04/22/2016      Reactions   Latex    Varenicline Tartrate    Chantix - REACTION: nightmares      Medication List    TAKE these medications   acetaminophen 325 MG tablet Commonly known as:  TYLENOL You can take  2-3 tablets every 6 hours as needed for pain. You can alternate this with the ibuprofen. Do not take more than 4000 mg of Tylenol per 24 hour period. This is also in the prescribed medication.   HYDROcodone-acetaminophen 5-325 MG tablet Commonly known as:  NORCO/VICODIN Take 1-2 tablets by mouth every 4 (four) hours as needed for moderate pain.   ibuprofen 200 MG tablet Commonly known as:  ADVIL,MOTRIN You can take 2-3 tablets every 6 hours as needed for pain.  You can also alternate this with plain Tylenol for pain at home. What changed:  how much to take  how to take this  when to take this  reasons to take this  additional instructions   ketorolac 0.5 % ophthalmic solution Commonly known as:  ACULAR Place 1 drop into the right eye every 8 (eight) hours as needed (eye pain).   PLAQUENIL 200 MG tablet Generic drug:  hydroxychloroquine Take 200 mg by mouth 2 (two) times daily.   trimethoprim-polymyxin b ophthalmic solution Commonly known as:  POLYTRIM Place 1 drop into the right eye every 8 (eight) hours.   TUMS 500 MG chewable tablet Generic drug:  calcium carbonate Chew 1 tablet by mouth as needed for indigestion or heartburn.      Follow-up Information    [facility] Follow up on 05/13/2016.   Specialty:  General Surgery Why:  Your appointment is at 11:30 AM, be at the office 30 minutes early to check in.  Bring photo ID and insurance information with you.  Contact information: [place] [address] [phone]        Wyatt Portela, MD Follow up on 04/23/2016.   Specialty:  Ophthalmology Why:  Keep your appointment tomorrow.  Bring your eye drops with you so he can see what you are on.   Contact information: [address] [place] [address] [phone]           Signed: Earnstine Regal 04/22/2016, 4:21 PM

## 2016-04-22 NOTE — Anesthesia Procedure Notes (Signed)
Procedure Name: Intubation Performed by: Gean Maidens Pre-anesthesia Checklist: Patient identified, Emergency Drugs available, Suction available, Patient being monitored and Timeout performed Patient Re-evaluated:Patient Re-evaluated prior to inductionOxygen Delivery Method: Circle system utilized Preoxygenation: Pre-oxygenation with 100% oxygen Intubation Type: IV induction Ventilation: Mask ventilation without difficulty Laryngoscope Size: Mac and 3 Grade View: Grade II Tube type: Oral Tube size: 7.0 mm Number of attempts: 1 Airway Equipment and Method: Stylet Placement Confirmation: ETT inserted through vocal cords under direct vision,  positive ETCO2,  CO2 detector and breath sounds checked- equal and bilateral Secured at: 21 cm Tube secured with: Tape Dental Injury: Teeth and Oropharynx as per pre-operative assessment

## 2016-04-22 NOTE — Progress Notes (Signed)
Pt discharged from the unit via wheelchair. Discharge instructions were reviewed with the pt and family members. Pt and family was advised to follow up with o/p appt. No questions or concerns at this time.  Celsa Nordahl W Maxamus Colao, RN

## 2016-04-22 NOTE — Op Note (Signed)
Re:   Ashlee Allen DOB:   05/02/1950 MRN:   [id number]                   FACILITY:  [facility]  DATE OF PROCEDURE: 04/22/2016                              OPERATIVE REPORT  PREOPERATIVE DIAGNOSIS:  Appendicitis  POSTOPERATIVE DIAGNOSIS:  Acute purulent appendicitis.  PROCEDURE:  Laparoscopic appendectomy.  SURGEON:  Fenton Malling. Lucia Gaskins, MD  ASSISTANT:  No first assistant.  ANESTHESIA:  General endotracheal.  Anesthesiologist: Lyndle Herrlich, MD CRNA: Gean Maidens, CRNA  ASA:  2E  ESTIMATED BLOOD LOSS:  Minimal.  DRAINS: none   SPECIMEN:   Appendix  COUNTS CORRECT:  YES  INDICATIONS FOR PROCEDURE: Ashlee Allen is a 66 y.o. (DOB: June 23, 1950) white female whose primary care doctor is Elsie Stain, MD and comes to the OR for an appendectomy.   I discussed with the patient, the indications and potential complications of appendiceal surgery.  The potential complications include, but are not limited to, bleeding, open surgery, bowel resection, and the possibility of another diagnosis.  OPERATIVE NOTE:  The patient underwent a general endotracheal anesthetic as supervised by Anesthesiologist: Lyndle Herrlich, MD CRNA: Gean Maidens, CRNA, General, in room #1.  The patient was given Zosyn prior to the beginning of the procedure and the abdomen was prepped with ChloraPrep.   A time-out was held and surgical checklist run.  An infraumbilical incision was made with sharp dissection carried down to the abdominal cavity.  An 12 mm Hasson trocar was inserted through the infraumbilical incision and into the peritoneal cavity.  A 0 degree 5 mm laparoscope was inserted through a 12 mm Hasson trocar and the Hasson trocar secured with a 0 Vicryl suture.  I placed a 5 mm trocar in the right upper quadrant and 5 mm torcar in left lower quadrant and did abdominal exploration.    The right and left lobes of liver unremarkable.  Stomach was unremarkable.  The pelvic organs were unremarkable.  The uterus  was absent.  I saw no other intra-abdominal abnormality.  The patient had purulent appendicitis with the appendix located right pelvic sidewall. It was covered in pus.  The appendix was stuck on the right ovary and fallopian tube, but pealed off easily.  The mesentery of the appendix was divided with a Harmonic scalpel.  I got to the base of the appendix.  I then used a blue load 45 mm Ethicon Endo-GIA stapler and fired this across the base of the appendix.  I placed the appendix in EndoCatch bag and delivered the bag through the umbilical incision.  I irrigated the abdomen with 500 cc of saline.  After irrigating the abdomen, I then removed the trocars, in turn.  The umbilical port fascia was closed with 0 Vicryl suture.   I closed the skin each site with a 4-0 Monocryl suture and painted the wounds with DermaBond.  I then injected a total of 30 mL of 0.25% Marcaine at the incisions.  Sponge and needle count were correct at the end of the case.  The patient was transferred to the recovery room in good condition.  The patient tolerated the procedure well and it depends on the patient's post op clinical course as to when the patient could be discharged.   Alphonsa Overall, MD, [facility] Pager: [phone] Office  phone:  [phone]

## 2016-04-22 NOTE — Transfer of Care (Signed)
Immediate Anesthesia Transfer of Care Note  Patient: Ashlee Allen  Procedure(s) Performed: Procedure(s): APPENDECTOMY LAPAROSCOPIC (N/A)  Patient Location: PACU  Anesthesia Type:General  Level of Consciousness: sedated, patient cooperative and responds to stimulation  Airway & Oxygen Therapy: Patient Spontanous Breathing and Patient connected to face mask oxygen  Post-op Assessment: Report given to RN and Post -op Vital signs reviewed and stable  Post vital signs: Reviewed and stable  Last Vitals:  Vitals:   04/21/16 1948 04/21/16 2204  BP: 99/62 102/55  Pulse: 92 90  Resp: 20 18  Temp:  36.8 C    Last Pain:  Vitals:   04/21/16 2325  TempSrc:   PainSc: 1          Complications: No apparent anesthesia complications

## 2016-04-23 ENCOUNTER — Telehealth: Payer: Self-pay | Admitting: Family Medicine

## 2016-04-23 ENCOUNTER — Telehealth: Payer: Self-pay

## 2016-04-23 ENCOUNTER — Encounter (HOSPITAL_COMMUNITY): Payer: Self-pay | Admitting: Surgery

## 2016-04-23 DIAGNOSIS — R911 Solitary pulmonary nodule: Secondary | ICD-10-CM | POA: Insufficient documentation

## 2016-04-23 NOTE — Anesthesia Postprocedure Evaluation (Signed)
Anesthesia Post Note  Patient: Ashlee Allen  Procedure(s) Performed: Procedure(s) (LRB): APPENDECTOMY LAPAROSCOPIC (N/A)  Anesthesia Type: General        Last Vitals:  Vitals:   04/22/16 1020 04/22/16 1426  BP: 122/63 (!) 118/54  Pulse: 100 94  Resp: 18 18  Temp: 36.8 C 36.8 C    Last Pain:  Vitals:   04/22/16 1652  TempSrc:   PainSc: 0-No pain   Pain Goal: Patients Stated Pain Goal: 0 (04/22/16 1500)               Aurilla Coulibaly EDWARD

## 2016-04-23 NOTE — Telephone Encounter (Addendum)
Either call patient or notify her with the TCM phone call.  1. I hope she is feeling better.  2. She had a CT scan that noted a 4 mm left lower lobe pulmonary nodule. Non-contrast chest CT is reasonable do in 12 months since she has smoked.  I put in a reminder in the EMR get this done in about one year. Thanks.

## 2016-04-23 NOTE — Telephone Encounter (Signed)
Attempted to reach pt on home phone for [employer]. Attempt unsuccessful. Phone rang multiple times with no response.

## 2016-04-24 NOTE — Telephone Encounter (Signed)
Ashlee Allen has tried multiple times to make the TCM call

## 2016-05-07 ENCOUNTER — Ambulatory Visit (INDEPENDENT_AMBULATORY_CARE_PROVIDER_SITE_OTHER): Payer: Medicare Other | Admitting: Family Medicine

## 2016-05-07 ENCOUNTER — Encounter: Payer: Self-pay | Admitting: Family Medicine

## 2016-05-07 DIAGNOSIS — R911 Solitary pulmonary nodule: Secondary | ICD-10-CM | POA: Diagnosis not present

## 2016-05-07 DIAGNOSIS — K37 Unspecified appendicitis: Secondary | ICD-10-CM | POA: Diagnosis not present

## 2016-05-07 NOTE — Patient Instructions (Signed)
Recheck CT in 1 year.  Update me in the meantime if needed.   Work on stop smoking.  Take care.  Glad to see you.

## 2016-05-07 NOTE — Progress Notes (Signed)
Pre visit review using our clinic review tool, if applicable. No additional management support is needed unless otherwise documented below in the visit note. 

## 2016-05-07 NOTE — Progress Notes (Signed)
Physician Discharge Summary  Patient ID: Ashlee Allen MRN: [id number] DOB/AGE: 11-03-1950 66 y.o.  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 Elby Beck Course:  66 y.o.(DOB: Oct 22, 1950) white femalewhose primary care physician is Elsie Stain, MDand comes to [place] today for abdominal pain. She is accompanied by her husband.  She has IBS, but really sees no one for this. She developed diffuse abdominal pain early Monday, 2/12. She has some diarrhea, but has this some with her IBS. As the day went on, the pain in her abdomen localized to the RLQ. With the continued and more focused pain, she went to [place].  Prior history of open cholecystectomy - [place], and hysterectomy - [place]. She had a limited colonoscopy in 2016 by Dr. Amedeo Plenty - he could not advance the scope the entire way - so she had a BE to complete the study.  CT scan of abdomen/pelvis - 04/21/2016 - 1. Acute uncomplicated appendicitis with the appendix measuring up to 11 mm in diameter. 2. 4 mm left lower lobe pulmonary nodule. No follow-up needed if patient is low-risk. Non-contrast chest CT can be considered in 12 months if patient is high-risk.  3. Hepatic steatosis. 4. Hysterectomy and cholecystectomy. WBC on admission 12,000  Patient was seen in the ED and taken to the operating room by Dr. Alphonsa Overall. She underwent appendectomy. She tolerated this well. She was returned to the floor after surgery. She developed some pain  in her right eye with some conjunctival injection noted in the right eye. Anesthesia was contacted and irrigated her and then placed her on Acular ophthalmic drops, and trimethoprim-polymyxin B ophthalmic drops. After irrigation with balanced salt ophthalmic solution and eyedrops her pain has improved significantly. She's been ambulating well. Her pain is well-controlled with mostly Tylenol and ibuprofen. She tolerated lunch well. She would like to go home this afternoon. She has a follow-up appointment with Dr. Wyatt Portela ophthalmology tomorrow morning at 10 AM. We'll plan to discharge her home. She is to follow-up with her PCP for the 4 mm left lower lobe pulmonary nodule. We will encourage her to discontinue tobacco use.  Follow-up will be in our office in 2-3 weeks. She is to follow-up with her PCP and Dr. Katy Fitch for her right corneal abrasion.  Condition ON discharge: Improving.  CBC Latest Ref Rng & Units 04/21/2016 05/20/2013 01/15/2010  WBC 4.0 - 10.5 K/uL 12.0(H) - 6.5  Hemoglobin 12.0 - 15.0 g/dL 13.6 14.2 14.3  Hematocrit 36.0 - 46.0 % 39.0 - 41.1  Platelets 150 - 400 K/uL 174 - 150.0   CMP Latest Ref Rng & Units 04/21/2016 12/19/2015 12/05/2014  Glucose 65 - 99 mg/dL 115(H) 87 90  BUN 6 - 20 mg/dL 10 16 15   Creatinine 0.44 - 1.00 mg/dL 0.72 0.84 0.85  Sodium 135 - 145 mmol/L 136 141 141  Potassium 3.5 - 5.1 mmol/L 3.5 4.2 4.4  Chloride 101 - 111 mmol/L 105 106 106  CO2 22 - 32 mmol/L 21(L) 28 27  Calcium 8.9 - 10.3 mg/dL 8.7(L) 9.3 9.4  Total Protein 6.5 - 8.1 g/dL 6.7  7.0 6.3  Total Bilirubin 0.3 - 1.2 mg/dL 0.8 0.4 0.4  Alkaline Phos 38 - 126 U/L 81 81 88  AST 15 - 41 U/L 29 19 17   ALT 14 - 54 U/L 20 22 18     ============================================= S/p appendectomy.  The above was discussed with patient. No abd pain.  Normal PO intake.  No FCNAV.  No blood in stool.  She is cutting back on smoking. Some baseline diarrhea.    Separate issues pulmonary nodule.  Imaging discussed with patient. Smoker. Cutting back on smoking. See plan.  Imaging d/w pt.   1. Acute uncomplicated appendicitis with the appendix measuring up to 11 mm in diameter. 2. 4 mm left lower lobe pulmonary nodule. No follow-up needed if patient is low-risk. Non-contrast chest CT can be considered in 12 months if patient is high-risk. This recommendation follows the consensus statement: Guidelines for Management of Incidental Pulmonary Nodules Detected on CT Images: From the Fleischner Society 2017; Radiology 2017; 284:228-243. 3. Hepatic steatosis. 4. Hysterectomy and cholecystectomy.  PMH and SH reviewed  ROS: Per HPI unless specifically indicated in ROS section   Meds, vitals, and allergies reviewed.   GEN: nad, alert and oriented HEENT: mucous membranes moist NECK: supple w/o LA CV: rrr. Murmur noted. PULM: ctab, no inc wob ABD: soft, +bs, healing port sites appear normal. EXT: no edema SKIN: no acute rash

## 2016-05-08 ENCOUNTER — Encounter: Payer: Self-pay | Admitting: Family Medicine

## 2016-05-08 NOTE — Assessment & Plan Note (Signed)
Resolve. Appears to have routine healing. Continue as is.

## 2016-05-08 NOTE — Assessment & Plan Note (Signed)
Noted, incidental, no pulmonary symptoms. Discussed with patient about options. Discussed with patient about smoking cessation. She's working on this. She will need CT chest in one year. Rationale discussed with patient. Pathophysiology of pulmonary nodules discussed with patient. There is a possibility of cancer but the lesion is so small she would not need anything done at this point other than a follow-up CT in one year. Any more invasive treatment at this point would be likely counterproductive. She understood all of that. The other question is about getting a full chest CT now. We talked about risk and benefits. She is going to need a follow-up CT in one year anyway. We don't have a full CT of the chest at this point, so that limits some information. We could get CT now to fully document any other potential nodules. This comes with a risk of extra radiation exposure. Risk and benefit discussed with patient. She has no pulmonary symptoms and at this point she wants to defer CT chest. She can update me as needed. We will still plan on CT in one year. She agrees with all this.  >25 minutes spent in face to face time with patient, >50% spent in counselling or coordination of care.

## 2016-05-29 ENCOUNTER — Encounter: Payer: Self-pay | Admitting: Gynecology

## 2016-07-17 ENCOUNTER — Telehealth: Payer: Self-pay | Admitting: Family Medicine

## 2016-07-17 NOTE — Telephone Encounter (Signed)
Pt called 07/14/16 left message for triage re: Shingrix vaccine.  Checked with CMA at her second call, we still do not have vaccine in stock.  PT WCB in 2-3 weeks to check availability.   Pt is wanting Shingrix only.

## 2016-07-23 ENCOUNTER — Encounter: Payer: Self-pay | Admitting: Gynecology

## 2016-08-07 ENCOUNTER — Ambulatory Visit (INDEPENDENT_AMBULATORY_CARE_PROVIDER_SITE_OTHER): Payer: Medicare Other | Admitting: Family Medicine

## 2016-08-07 ENCOUNTER — Encounter: Payer: Self-pay | Admitting: Family Medicine

## 2016-08-07 VITALS — BP 106/62 | HR 85 | Temp 98.3°F | Wt 161.0 lb

## 2016-08-07 DIAGNOSIS — J208 Acute bronchitis due to other specified organisms: Secondary | ICD-10-CM | POA: Diagnosis not present

## 2016-08-07 DIAGNOSIS — F17200 Nicotine dependence, unspecified, uncomplicated: Secondary | ICD-10-CM | POA: Diagnosis not present

## 2016-08-07 DIAGNOSIS — B9689 Other specified bacterial agents as the cause of diseases classified elsewhere: Secondary | ICD-10-CM | POA: Diagnosis not present

## 2016-08-07 MED ORDER — AZITHROMYCIN 250 MG PO TABS: ORAL_TABLET | ORAL | 0 refills | Status: DC

## 2016-08-07 MED ORDER — GUAIFENESIN-CODEINE 100-10 MG/5ML PO SYRP: 5.0000 mL | ORAL_SOLUTION | Freq: Two times a day (BID) | ORAL | 0 refills | Status: DC | PRN

## 2016-08-07 NOTE — Progress Notes (Addendum)
BP 106/62   Pulse 85   Temp 98.3 F (36.8 C) (Oral)   Wt 161 lb (73 kg)   SpO2 97%   BMI 26.38 kg/m    CC: cough, congestion Subjective:    Patient ID: Ashlee Allen, female    DOB: 12-02-1950, 66 y.o.   MRN: [id number]  HPI: Ashlee Allen is a 66 y.o. female presenting on 08/07/2016 for Cough and Sinusitis   3.5 wks ago with ST, rhinorrhea, cough. Never truly fever. Cough never resolved. Ongoing symptoms, trouble sleeping due to cough. Chest > head congestion. Mild daytime cough. Cough productive of white phlegm.   No ear or tooth pain.   Has tried OTC alka seltzer night time medicine.   Continued smoking <1/2 ppd.  [place] daughter with fever, ST.  No h/o asthma.   On plaquenil for discoid lupus.  Relevant past medical, surgical, family and social history reviewed and updated as indicated. Interim medical history since our last visit reviewed. Allergies and medications reviewed and updated. Outpatient Medications Prior to Visit  Medication Sig Dispense Refill  . calcium carbonate (TUMS) 500 MG chewable tablet Chew 1 tablet by mouth as needed for indigestion or heartburn.    . hydroxychloroquine (PLAQUENIL) 200 MG tablet Take 200 mg by mouth 2 (two) times daily.     Marland Kitchen ibuprofen (ADVIL,MOTRIN) 200 MG tablet You can take 2-3 tablets every 6 hours as needed for pain.  You can also alternate this with plain Tylenol for pain at home.     No facility-administered medications prior to visit.      Per HPI unless specifically indicated in ROS section below Review of Systems     Objective:    BP 106/62   Pulse 85   Temp 98.3 F (36.8 C) (Oral)   Wt 161 lb (73 kg)   SpO2 97%   BMI 26.38 kg/m   Wt Readings from Last 3 Encounters:  08/07/16 161 lb (73 kg)  05/07/16 162 lb 12 oz (73.8 kg)  04/21/16 162 lb (73.5 kg)    Physical Exam  Constitutional: She appears well-developed and well-nourished. No distress.  HENT:  Head: Normocephalic and atraumatic.  Right Ear:  External ear normal. Decreased hearing is noted.  Left Ear: External ear normal. Decreased hearing is noted.  Nose: Mucosal edema present. No rhinorrhea. Right sinus exhibits no maxillary sinus tenderness and no frontal sinus tenderness. Left sinus exhibits no maxillary sinus tenderness and no frontal sinus tenderness.  Mouth/Throat: Uvula is midline, oropharynx is clear and moist and mucous membranes are normal. No oropharyngeal exudate, posterior oropharyngeal edema, posterior oropharyngeal erythema or tonsillar abscesses.  Hearing aides in place Nasal congestion and irritation  Eyes: Conjunctivae and EOM are normal. Pupils are equal, round, and reactive to light. No scleral icterus.  Neck: Normal range of motion. Neck supple.  Cardiovascular: Normal rate, regular rhythm and intact distal pulses.   Murmur (2/6 SEM USB) heard. Pulmonary/Chest: [place] normal and breath sounds normal. No respiratory distress. She has no wheezes. She has no rales.  Lungs clear  Lymphadenopathy:    She has no cervical adenopathy.  Skin: Skin is warm and dry. No rash noted.  Nursing note and vitals reviewed.      Assessment & Plan:   Problem List Items Addressed This Visit    Acute bacterial bronchitis - Primary    Anticipate bacterial bronchitis given duration and progression of symptoms. Cover with zpack. cheratussin for cough at night. Further supportive care as  per instructions.       Relevant Medications   azithromycin (ZITHROMAX) 250 MG tablet   Smoker    Continue to encourage smoking cessation          Follow up plan: Return if symptoms worsen or fail to improve.  Ria Bush, MD

## 2016-08-07 NOTE — Assessment & Plan Note (Signed)
Anticipate bacterial bronchitis given duration and progression of symptoms. Cover with zpack. cheratussin for cough at night. Further supportive care as per instructions.

## 2016-08-07 NOTE — Assessment & Plan Note (Signed)
Continue to encourage smoking cessation. 

## 2016-08-07 NOTE — Patient Instructions (Signed)
I do think you have bronchitis.  Treat with zpack antibiotic.  May use codeine cough syrup for night time cough Push fluids and rest. May use plain mucinex with plenty of water to help mobilize mucous.  Ok to take ibuprofen as needed as well  If any fever >101, worsening productive cough, or just not improving with antibiotic, let us know.

## 2016-09-19 NOTE — Addendum Note (Signed)
Addendum  created 09/19/16 1512 by Lyndle Herrlich, MD   Sign clinical note

## 2016-09-19 NOTE — Anesthesia Postprocedure Evaluation (Signed)
Anesthesia Post Note  Patient: Ashlee Allen  Procedure(s) Performed: Procedure(s) (LRB): APPENDECTOMY LAPAROSCOPIC (N/A)     Anesthesia Post Evaluation  Last Vitals:  Vitals:   04/22/16 1020 04/22/16 1426  BP: 122/63 (!) 118/54  Pulse: 100 94  Resp: 18 18  Temp: 36.8 C 36.8 C    Last Pain:  Vitals:   04/22/16 1652  TempSrc:   PainSc: 0-No pain                 Linh Hedberg EDWARD

## 2016-11-25 LAB — HM MAMMOGRAPHY

## 2016-12-05 ENCOUNTER — Encounter: Payer: Self-pay | Admitting: Family Medicine

## 2016-12-30 ENCOUNTER — Ambulatory Visit: Payer: Medicare Other

## 2017-01-02 ENCOUNTER — Ambulatory Visit (INDEPENDENT_AMBULATORY_CARE_PROVIDER_SITE_OTHER): Payer: Medicare Other

## 2017-01-02 ENCOUNTER — Other Ambulatory Visit: Payer: Self-pay | Admitting: Family Medicine

## 2017-01-02 VITALS — BP 102/60 | HR 94 | Temp 97.9°F | Ht 65.0 in | Wt 162.2 lb

## 2017-01-02 DIAGNOSIS — Z0000 Encounter for general adult medical examination without abnormal findings: Secondary | ICD-10-CM

## 2017-01-02 DIAGNOSIS — E785 Hyperlipidemia, unspecified: Secondary | ICD-10-CM

## 2017-01-02 DIAGNOSIS — Z23 Encounter for immunization: Secondary | ICD-10-CM

## 2017-01-02 LAB — LIPID PANEL
Cholesterol: 164 mg/dL (ref 0–200)
HDL: 44.8 mg/dL (ref >39.00)
LDL CALC: 85 mg/dL (ref 0–99)
NONHDL: 118.73
Total CHOL/HDL Ratio: 4
Triglycerides: 168 mg/dL — ABNORMAL HIGH (ref 0.0–149.0)
VLDL: 33.6 mg/dL (ref 0.0–40.0)

## 2017-01-02 LAB — COMPREHENSIVE METABOLIC PANEL
ALT: 15 U/L (ref 0–35)
AST: 17 U/L (ref 0–37)
Albumin: 4.1 g/dL (ref 3.5–5.2)
Alkaline Phosphatase: 74 U/L (ref 39–117)
BUN: 16 mg/dL (ref 6–23)
CHLORIDE: 106 meq/L (ref 96–112)
CO2: 30 meq/L (ref 19–32)
Calcium: 9.3 mg/dL (ref 8.4–10.5)
Creatinine, Ser: 0.8 mg/dL (ref 0.40–1.20)
GFR: 75.98 mL/min (ref >60.00)
GLUCOSE: 102 mg/dL — AB (ref 70–99)
POTASSIUM: 3.8 meq/L (ref 3.5–5.1)
SODIUM: 140 meq/L (ref 135–145)
TOTAL PROTEIN: 6.2 g/dL (ref 6.0–8.3)
Total Bilirubin: 0.4 mg/dL (ref 0.2–1.2)

## 2017-01-02 NOTE — Patient Instructions (Signed)
Ashlee Allen , Thank you for taking time to come for your Medicare Wellness Visit. I appreciate your ongoing commitment to your health goals. Please review the following plan we discussed and let me know if I can assist you in the future.   These are the goals we discussed: Goals    . Increase physical activity          Starting 01/02/2017, I will continue to walking for at least 30 min daily.        This is a list of the screening recommended for you and due dates:  Health Maintenance  Topic Date Due  . Mammogram  11/26/2018  . Colon Cancer Screening  03/28/2020  . Tetanus Vaccine  04/29/2023  . Flu Shot  Completed  . DEXA scan (bone density measurement)  Completed  .  Hepatitis C: One time screening is recommended by Center for Disease Control  (CDC) for  adults born from [birth year] through 1966.   Completed  . Pneumonia vaccines  Completed   Preventive Care for Adults  A healthy lifestyle and preventive care can promote health and wellness. Preventive health guidelines for adults include the following key practices.  . A routine yearly physical is a good way to check with your health care provider about your health and preventive screening. It is a chance to share any concerns and updates on your health and to receive a thorough exam.  . Visit your dentist for a routine exam and preventive care every 6 months. Brush your teeth twice a day and floss once a day. Good oral hygiene prevents tooth decay and gum disease.  . The frequency of eye exams is based on your age, health, family medical history, use  of contact lenses, and other factors. Follow your health care provider's recommendations for frequency of eye exams.  . Eat a healthy diet. Foods like vegetables, fruits, whole grains, low-fat dairy products, and lean protein foods contain the nutrients you need without too many calories. Decrease your intake of foods high in solid fats, added sugars, and salt. Eat the right amount of  calories for you. Get information about a proper diet from your health care provider, if necessary.  . Regular physical exercise is one of the most important things you can do for your health. Most adults should get at least 150 minutes of moderate-intensity exercise (any activity that increases your heart rate and causes you to sweat) each week. In addition, most adults need muscle-strengthening exercises on 2 or more days a week.  Silver Sneakers may be a benefit available to you. To determine eligibility, you may visit the website: www.silversneakers.com or contact program at [phone] Mon-Fri between 8AM-8PM.   . Maintain a healthy weight. The body mass index (BMI) is a screening tool to identify possible weight problems. It provides an estimate of body fat based on height and weight. Your health care provider can find your BMI and can help you achieve or maintain a healthy weight.   For adults 20 years and older: ? A BMI below 18.5 is considered underweight. ? A BMI of 18.5 to 24.9 is normal. ? A BMI of 25 to 29.9 is considered overweight. ? A BMI of 30 and above is considered obese.   . Maintain normal blood lipids and cholesterol levels by exercising and minimizing your intake of saturated fat. Eat a balanced diet with plenty of fruit and vegetables. Blood tests for lipids and cholesterol should begin at age 22 and be  repeated every 5 years. If your lipid or cholesterol levels are high, you are over 50, or you are at high risk for heart disease, you may need your cholesterol levels checked more frequently. Ongoing high lipid and cholesterol levels should be treated with medicines if diet and exercise are not working.  . If you smoke, find out from your health care provider how to quit. If you do not use tobacco, please do not start.  . If you choose to drink alcohol, please do not consume more than 2 drinks per day. One drink is considered to be 12 ounces (355 mL) of beer, 5 ounces  (148 mL) of wine, or 1.5 ounces (44 mL) of liquor.  . If you are 57-66 years old, ask your health care provider if you should take aspirin to prevent strokes.  . Use sunscreen. Apply sunscreen liberally and repeatedly throughout the day. You should seek shade when your shadow is shorter than you. Protect yourself by wearing long sleeves, pants, a wide-brimmed hat, and sunglasses year round, whenever you are outdoors.  . Once a month, do a whole body skin exam, using a mirror to look at the skin on your back. Tell your health care provider of new moles, moles that have irregular borders, moles that are larger than a pencil eraser, or moles that have changed in shape or color.

## 2017-01-02 NOTE — Progress Notes (Signed)
Subjective:   Ashlee Allen is a 66 y.o. female who presents for Medicare Annual (Subsequent) preventive examination.  Review of Systems: N/A Cardiac Risk Factors include: advanced age (>39men, >7 women);smoking/ tobacco exposure;dyslipidemia     Objective:     Vitals: BP 102/60 (BP Location: Right Arm, Patient Position: Sitting, Cuff Size: Normal)   Pulse 94   Temp 97.9 F (36.6 C) (Oral)   Ht 5\' 5"  (1.651 m) Comment: no shoes  Wt 162 lb 4 oz (73.6 kg)   SpO2 97%   BMI 27.00 kg/m   Body mass index is 27 kg/m.   Tobacco History  Smoking Status  . Current Every Day Smoker  . Packs/day: 0.40  . Years: 40.00  Smokeless Tobacco  . Never Used     Ready to quit: No Counseling given: No   Past Medical History:  Diagnosis Date  . Cardiac murmur    echo 8-9 yr ago-not sure where-no tx  . Depression   . Discoid lupus    Dr. Kalman Shan (prev seen at [facility]) and Dr. Ubaldo Glassing  . Fibroid   . GERD (gastroesophageal reflux disease)    controllled with weight loss   . Pulmonary nodule    Past Surgical History:  Procedure Laterality Date  . ABDOMINAL HYSTERECTOMY  1997   TAH  [facility]  . BREAST SURGERY  2001   Duct excised-rt  . CATARACT EXTRACTION, BILATERAL    . CHOLECYSTECTOMY  1991  . COLONOSCOPY    . HAND SURGERY  2001   ctr-both  . INNER EAR SURGERY     left- mastoidectomy  . KNEE ARTHROSCOPY Left 05/20/2013   Procedure: LEFT KNEE ARTHROSCOPY WITH DEBRIDEMENT/SHAVING (CHONDROPLASTY) AND LATERAL AND MEDIAL MENISECTOMY, EXCISION OF PLICA;  Surgeon: Yvette Rack., MD;  Location: [facility];  Service: Orthopedics;  Laterality: Left;  . LAPAROSCOPIC APPENDECTOMY N/A 04/22/2016   Procedure: APPENDECTOMY LAPAROSCOPIC;  Surgeon: Alphonsa Overall, MD;  Location: [facility];  Service: General;  Laterality: N/A;   Family History  Problem Relation Age of Onset  . Alcohol abuse Mother   . Hyperlipidemia Mother   . Hypertension Mother   . Diabetes Mother   . Cancer  Mother        LUNG- SMOKER   . Alcohol abuse Father   . Cancer Father        lung  . Seizures Brother   . Colon cancer Neg Hx   . Breast cancer Neg Hx    History  Sexual Activity  . Sexual activity: Yes  . Birth control/ protection: Surgical    Comment: partner - less than 5 (only 1)    Outpatient Encounter Prescriptions as of 01/02/2017  Medication Sig  . calcium carbonate (TUMS) 500 MG chewable tablet Chew 1 tablet by mouth as needed for indigestion or heartburn.  . hydroxychloroquine (PLAQUENIL) 200 MG tablet Take 200 mg by mouth 2 (two) times daily.   . [DISCONTINUED] ibuprofen (ADVIL,MOTRIN) 200 MG tablet You can take 2-3 tablets every 6 hours as needed for pain.  You can also alternate this with plain Tylenol for pain at home.  . [DISCONTINUED] azithromycin (ZITHROMAX) 250 MG tablet Take two tablets on day one followed by one tablet on days 2-5  . [DISCONTINUED] guaiFENesin-codeine (CHERATUSSIN AC) 100-10 MG/5ML syrup Take 5 mLs by mouth 2 (two) times daily as needed for cough (sedation precautions). (Patient not taking: Reported on 01/02/2017)   No facility-administered encounter medications on file as of 01/02/2017.  Activities of Daily Living In your present state of health, do you have any difficulty performing the following activities: 01/02/2017 04/22/2016  Hearing? Y Y  Comment hearing aids -  Vision? N N  Difficulty concentrating or making decisions? N N  Walking or climbing stairs? N N  Dressing or bathing? N N  Doing errands, shopping? N N  Preparing Food and eating ? N -  Using the Toilet? N -  In the past six months, have you accidently leaked urine? N -  Do you have problems with loss of bowel control? N -  Managing your Medications? N -  Managing your Finances? N -  Housekeeping or managing your Housekeeping? N -  Some recent data might be hidden    Patient Care Team: Tonia Ghent, MD as PCP - General [place], Heather, OD as Referring Physician  (Optometry)    Assessment:    Hearing Screening Comments: Bilateral hearing aids Vision Screening Comments: Last vision exam in Summer 2018 with Dr. [place]  Exercise Activities and Dietary recommendations Current Exercise Habits: Home exercise routine, Type of exercise: walking, Time (Minutes): 40, Frequency (Times/Week): 7, Weekly Exercise (Minutes/Week): 280, Intensity: Mild, Exercise limited by: None identified  Goals    . Increase physical activity          Starting 01/02/2017, I will continue to walking for at least 30 min daily.       Fall Risk Fall Risk  01/02/2017 12/19/2015 12/11/2014  Falls in the past year? No No No   Depression Screen PHQ 2/9 Scores 01/02/2017 12/19/2015 12/11/2014  PHQ - 2 Score 0 0 0  PHQ- 9 Score 0 - -     Cognitive Function MMSE - Mini Mental State Exam 01/02/2017 12/19/2015  Orientation to time 5 5  Orientation to Place 5 5  Registration 3 3  Attention/ Calculation 0 0  Recall 3 3  Language- name 2 objects 0 0  Language- repeat 1 1  Language- follow 3 step command 3 3  Language- read & follow direction 0 0  Write a sentence 0 0  Copy design 0 0  Total score 20 20     PLEASE NOTE: A Mini-Cog screen was completed. Maximum score is 20. A value of 0 denotes this part of Folstein MMSE was not completed or the patient failed this part of the Mini-Cog screening.   Mini-Cog Screening Orientation to Time - Max 5 pts Orientation to Place - Max 5 pts Registration - Max 3 pts Recall - Max 3 pts Language Repeat - Max 1 pts Language Follow 3 Step Command - Max 3 pts    Immunization History  Administered Date(s) Administered  . Influenza Split 12/25/2010, 12/17/2011, 12/02/2012  . Influenza Whole 01/08/2010, 12/17/2011  . Influenza, High Dose Seasonal PF 01/02/2017  . Influenza,inj,Quad PF,6+ Mos 12/11/2014, 12/19/2015  . Influenza-Unspecified 12/07/2013  . Pneumococcal Conjugate-13 12/11/2014  . Pneumococcal Polysaccharide-23 12/19/2015  .  Td 08/24/2007  . Tdap 04/28/2013   Screening Tests Health Maintenance  Topic Date Due  . MAMMOGRAM  11/26/2018  . COLONOSCOPY  03/28/2020  . TETANUS/TDAP  04/29/2023  . INFLUENZA VACCINE  Completed  . DEXA SCAN  Completed  . Hepatitis C Screening  Completed  . PNA vac Low Risk Adult  Completed      Plan:   I have personally reviewed, addressed, and noted the following in the patient's chart:  A. Medical and social history B. Use of alcohol, tobacco or illicit drugs  C.  Current medications and supplements D. Functional ability and status E.  Nutritional status F.  Physical activity G. Advance directives H. List of other physicians I.  Hospitalizations, surgeries, and ER visits in previous 12 months J.  Walker to include hearing, vision, cognitive, depression L. Referrals and appointments - none  In addition, I have reviewed and discussed with patient certain preventive protocols, quality metrics, and best practice recommendations. A written personalized care plan for preventive services as well as general preventive health recommendations were provided to patient.  See attached scanned questionnaire for additional information.   Signed,   Lindell Noe, MHA, BS, LPN Health Coach

## 2017-01-02 NOTE — Progress Notes (Signed)
PCP notes:   Health maintenance:  Flu vaccine - administered  Abnormal screenings:   None  Patient concerns:   None  Nurse concerns:  None  Next PCP appt:   01/12/17 @ 1500 I reviewed health advisor's note, was available for consultation on the day of service listed in this note, and agree with documentation and plan. Elsie Stain, MD.

## 2017-01-02 NOTE — Progress Notes (Signed)
Pre visit review using our clinic review tool, if applicable. No additional management support is needed unless otherwise documented below in the visit note. 

## 2017-01-12 ENCOUNTER — Ambulatory Visit (INDEPENDENT_AMBULATORY_CARE_PROVIDER_SITE_OTHER): Payer: Medicare Other | Admitting: Family Medicine

## 2017-01-12 ENCOUNTER — Encounter: Payer: Self-pay | Admitting: Family Medicine

## 2017-01-12 VITALS — BP 102/60 | HR 94 | Temp 97.9°F | Ht 65.0 in | Wt 162.2 lb

## 2017-01-12 DIAGNOSIS — R300 Dysuria: Secondary | ICD-10-CM

## 2017-01-12 DIAGNOSIS — Z23 Encounter for immunization: Secondary | ICD-10-CM

## 2017-01-12 DIAGNOSIS — Z0000 Encounter for general adult medical examination without abnormal findings: Secondary | ICD-10-CM

## 2017-01-12 DIAGNOSIS — F17200 Nicotine dependence, unspecified, uncomplicated: Secondary | ICD-10-CM

## 2017-01-12 DIAGNOSIS — Z7189 Other specified counseling: Secondary | ICD-10-CM

## 2017-01-12 LAB — POC URINALSYSI DIPSTICK (AUTOMATED)
GLUCOSE UA: NEGATIVE
Nitrite, UA: NEGATIVE
RBC UA: NEGATIVE
Urobilinogen, UA: 0.2 E.U./dL
pH, UA: 6 (ref 5.0–8.0)

## 2017-01-12 MED ORDER — HYDROXYCHLOROQUINE SULFATE 200 MG PO TABS: ORAL_TABLET | ORAL | Status: AC

## 2017-01-12 MED ORDER — ZOSTER VAC RECOMB ADJUVANTED 50 MCG/0.5ML IM SUSR: 0.5000 mL | Freq: Once | INTRAMUSCULAR | 1 refills | Status: AC

## 2017-01-12 NOTE — Patient Instructions (Signed)
Check urine culture.  Drink more water in the meantime.  Take care.  Glad to see you.  Update me as needed.

## 2017-01-12 NOTE — Progress Notes (Signed)
Some discomfort with urination but more of an issue with change in urine odor.    Still smoking, with an intermittent cough.  She isn't set on quitting yet.  She is afraid about failing with cessation and then doesn't try.   Support offered.    Flu up to date Shingles d/w pt. She should be able to get shingrix.  See orders.   PNA 2016 Tetanus 2015 Colonoscopy 2017 Breast cancer screening 2018 Pap not due given her age.   DXA 2016, wnl.  Okay to defer in 2019.   Advance directive- husband designated if patient were incapacitated.    Discoid lupus per derm.  LFTs and Cr wnl on plaquenil.  She has variable flares.  I will defer to derm.  She agrees.    Meds, vitals, and allergies reviewed.   ROS: Per HPI unless specifically indicated in ROS section   GEN: nad, alert and oriented HEENT: mucous membranes moist NECK: supple w/o LA CV: rrr.  no murmur PULM: ctab, no inc wob ABD: soft, +bs EXT: no edema SKIN: no acute rash, old scar from lupus treatment noted on the upper anterior forehead.

## 2017-01-14 DIAGNOSIS — R300 Dysuria: Secondary | ICD-10-CM | POA: Insufficient documentation

## 2017-01-14 DIAGNOSIS — Z0000 Encounter for general adult medical examination without abnormal findings: Secondary | ICD-10-CM | POA: Insufficient documentation

## 2017-01-14 NOTE — Assessment & Plan Note (Signed)
Flu up to date Shingles d/w pt. She should be able to get shingrix.  See orders.   PNA 2016 Tetanus 2015 Colonoscopy 2017 Breast cancer screening 2018 Pap not due given her age.   DXA 2016, wnl.  Okay to defer in 2019.

## 2017-01-14 NOTE — Assessment & Plan Note (Signed)
Advance directive- husband designated if patient were incapacitated.  

## 2017-01-14 NOTE — Assessment & Plan Note (Signed)
Increase fluid intake.  Check urine culture.  Unclear if she truly has an infection but benign exam and okay to await urine culture results before considering antibiotics.  She agrees.  Update me as needed.

## 2017-01-14 NOTE — Assessment & Plan Note (Signed)
See above.  With cough that is likely related to smoking.  Lungs are clear.  Okay for outpatient follow-up.  She has not yet set to quit smoking.  Encouraged.  I will await input from her.  She agrees.

## 2017-01-15 ENCOUNTER — Other Ambulatory Visit: Payer: Self-pay | Admitting: Family Medicine

## 2017-01-15 LAB — URINE CULTURE
MICRO NUMBER: 81239865
SPECIMEN QUALITY: ADEQUATE

## 2017-01-15 MED ORDER — SULFAMETHOXAZOLE-TRIMETHOPRIM 400-80 MG PO TABS: 1.0000 | ORAL_TABLET | Freq: Two times a day (BID) | ORAL | 0 refills | Status: DC

## 2017-02-17 ENCOUNTER — Ambulatory Visit: Payer: Medicare Other | Admitting: Family Medicine

## 2017-02-19 ENCOUNTER — Ambulatory Visit: Payer: Medicare Other | Admitting: Family Medicine

## 2017-04-12 ENCOUNTER — Other Ambulatory Visit: Payer: Self-pay | Admitting: Family Medicine

## 2017-04-12 DIAGNOSIS — R918 Other nonspecific abnormal finding of lung field: Secondary | ICD-10-CM

## 2017-04-12 NOTE — Progress Notes (Signed)
Call pt.  Due for f/u chest CT, ordered, re: pulmonary nodule.  She should get a call about scheduling.  Thanks.   Patient advised.   Mike Craze, CMA  03/13/2017

## 2017-04-17 ENCOUNTER — Encounter: Payer: Self-pay | Admitting: Family Medicine

## 2017-04-20 ENCOUNTER — Ambulatory Visit (INDEPENDENT_AMBULATORY_CARE_PROVIDER_SITE_OTHER)
Admission: RE | Admit: 2017-04-20 | Discharge: 2017-04-20 | Disposition: A | Discharge status: Home / Self Care | Payer: Medicare Other | Source: Ambulatory Visit | Attending: Family Medicine | Admitting: Family Medicine

## 2017-04-20 DIAGNOSIS — R918 Other nonspecific abnormal finding of lung field: Secondary | ICD-10-CM | POA: Diagnosis not present

## 2017-05-26 ENCOUNTER — Ambulatory Visit: Payer: Medicare Other | Admitting: Family Medicine

## 2017-05-26 ENCOUNTER — Encounter: Payer: Self-pay | Admitting: Family Medicine

## 2017-05-26 VITALS — BP 148/72 | HR 88 | Temp 97.9°F | Wt 165.5 lb

## 2017-05-26 DIAGNOSIS — F17200 Nicotine dependence, unspecified, uncomplicated: Secondary | ICD-10-CM

## 2017-05-26 DIAGNOSIS — R21 Rash and other nonspecific skin eruption: Secondary | ICD-10-CM

## 2017-05-26 DIAGNOSIS — E785 Hyperlipidemia, unspecified: Secondary | ICD-10-CM

## 2017-05-26 DIAGNOSIS — R8290 Unspecified abnormal findings in urine: Secondary | ICD-10-CM | POA: Diagnosis not present

## 2017-05-26 LAB — POC URINALSYSI DIPSTICK (AUTOMATED)
BILIRUBIN UA: NEGATIVE
Blood, UA: NEGATIVE
GLUCOSE UA: NEGATIVE
Ketones, UA: NEGATIVE
Leukocytes, UA: NEGATIVE
NITRITE UA: NEGATIVE
PH UA: 6 (ref 5.0–8.0)
Protein, UA: NEGATIVE
SPEC GRAV UA: 1.01 (ref 1.010–1.025)
UROBILINOGEN UA: 0.2 U/dL

## 2017-05-26 MED ORDER — ATORVASTATIN CALCIUM 10 MG PO TABS: 10.0000 mg | ORAL_TABLET | Freq: Every day | ORAL | 3 refills | Status: DC

## 2017-05-26 NOTE — Progress Notes (Signed)
Dysuria: no burning but some odor with urination.   duration of symptoms: a few days abdominal pain: no Fevers: no back pain: some R lower back pain Vomiting: no U/a d/w pt.  ucx pending.   Statin use d/w pt. she has indication with calcification noted on imaging but it would be likely more important for her to stop smoking.  We talked about statin use and indication.  She is going to hold Lipitor for now and try to start that medication after she is able to stop smoking.  See after visit summary.  1-1.5 PPD smoking, d/w pt. She is trying to cut down.  She is going to taper 1 cig a day.  We talked about gum use, if needed.  She didn't tolerate chantix.   Rash started prior to xmas 2018.  She has used betamethasone episodically.  She has used TAC cream also in the past.  She just finished 18 days of prednisone for rash, with improvement on med.  Then the itching restarted.  This new rash seems to be different from her prev lupus troubles.  She prev had a biopsy per derm.    Meds, vitals, and allergies reviewed.   Per HPI unless specifically indicated in ROS section   GEN: nad, alert and oriented HEENT: mucous membranes moist NECK: supple CV: rrr.  PULM: ctab, no inc wob ABD: soft, +bs, suprapubic area tender EXT: no edema SKIN: diffuse blanching rash on the back BACK: no CVA pain

## 2017-05-26 NOTE — Patient Instructions (Addendum)
We'll contact you with your lab report. Call Dr. Josie Dixon office and tell them the rash is back.  See if she has options in the meantime.   Hold the rx for lipitor for now.  When feasible, then start it.  Update me if you have aches.  If you can tolerate it, then we need to recheck your labs prior to a physical.  See if the gum helps with nicotine replacement.  Thanks for your effort.  Take care.  Glad to see you.

## 2017-05-27 DIAGNOSIS — R8290 Unspecified abnormal findings in urine: Secondary | ICD-10-CM | POA: Insufficient documentation

## 2017-05-27 DIAGNOSIS — R21 Rash and other nonspecific skin eruption: Secondary | ICD-10-CM | POA: Insufficient documentation

## 2017-05-27 NOTE — Assessment & Plan Note (Signed)
Statin use d/w pt. she has indication with calcification noted on imaging but it would be likely more important for her to stop smoking.  We talked about statin use and indication.  She is going to hold Lipitor for now and try to start that medication after she is able to stop smoking.  See after visit summary.

## 2017-05-27 NOTE — Assessment & Plan Note (Signed)
She does not have dysuria.  Would check urine culture and go from there.  If positive, then treat.  If negative then just drink more fluids.  See notes on labs.

## 2017-05-27 NOTE — Assessment & Plan Note (Signed)
1-1.5 PPD smoking, d/w pt. She is trying to cut down.  She is going to taper 1 cig a day.  We talked about gum use, if needed.  She didn't tolerate chantix.  She will update me as needed.

## 2017-05-27 NOTE — Assessment & Plan Note (Signed)
This appears to be separate from her issues with lupus.  I will have to defer to dermatology.  See after visit summary. She agrees with plan.

## 2017-05-28 ENCOUNTER — Other Ambulatory Visit: Payer: Self-pay | Admitting: Family Medicine

## 2017-05-28 LAB — URINE CULTURE
MICRO NUMBER: 90345100
SPECIMEN QUALITY: ADEQUATE

## 2017-05-28 MED ORDER — CEPHALEXIN 500 MG PO CAPS: 500.0000 mg | ORAL_CAPSULE | Freq: Two times a day (BID) | ORAL | 0 refills | Status: DC

## 2017-10-07 ENCOUNTER — Telehealth: Payer: Self-pay | Admitting: *Deleted

## 2017-10-07 MED ORDER — ATORVASTATIN CALCIUM 10 MG PO TABS: 5.0000 mg | ORAL_TABLET | Freq: Every day | ORAL | 3 refills | Status: DC

## 2017-10-07 NOTE — Telephone Encounter (Signed)
Received a form from [place] stating that the patient states that she is taking Atorvastatin 10 mg, 1/2 by mouth daily, which would be 5 mg daily. Pharmacy is requesting a new script with correct directions. Pharmacy-[facility]

## 2017-10-07 NOTE — Telephone Encounter (Signed)
Sent. Thanks.   

## 2017-12-11 ENCOUNTER — Ambulatory Visit: Payer: Medicare Other | Admitting: Gynecology

## 2017-12-11 ENCOUNTER — Encounter: Payer: Self-pay | Admitting: Gynecology

## 2017-12-11 VITALS — BP 118/76 | Ht 65.0 in | Wt 163.0 lb

## 2017-12-11 DIAGNOSIS — N3000 Acute cystitis without hematuria: Secondary | ICD-10-CM

## 2017-12-11 DIAGNOSIS — N952 Postmenopausal atrophic vaginitis: Secondary | ICD-10-CM

## 2017-12-11 DIAGNOSIS — Z01419 Encounter for gynecological examination (general) (routine) without abnormal findings: Secondary | ICD-10-CM

## 2017-12-11 MED ORDER — SULFAMETHOXAZOLE-TRIMETHOPRIM 800-160 MG PO TABS: 1.0000 | ORAL_TABLET | Freq: Two times a day (BID) | ORAL | 0 refills | Status: DC

## 2017-12-11 NOTE — Progress Notes (Signed)
    Ashlee Allen 1950/03/30 [id number]        67 y.o.  [id number] for annual gynecologic exam.  Former patient of Dr. Toney Rakes.  She does note her urine has a stronger odor and less volume which usually happens when she has a UTI.  No frequency dysuria urgency low back pain fever or chills.  Past medical history,surgical history, problem list, medications, allergies, family history and social history were all reviewed and documented as reviewed in the EPIC chart.  ROS:  Performed with pertinent positives and negatives included in the history, assessment and plan.   Additional significant findings : None   Exam: Caryn Bee assistant Vitals:   12/11/17 1124  BP: 118/76  Weight: 163 lb (73.9 kg)  Height: 5\' 5"  (1.651 m)   Body mass index is 27.12 kg/m.  General appearance:  Normal affect, orientation and appearance. Skin: Grossly normal HEENT: Without gross lesions.  No cervical or supraclavicular adenopathy. Thyroid normal.  Lungs:  Clear without wheezing, rales or rhonchi Cardiac: RR, without RMG Abdominal:  Soft, nontender, without masses, guarding, rebound, organomegaly or hernia Breasts:  Examined lying and sitting without masses, retractions, discharge or axillary adenopathy. Pelvic:  Ext, BUS, Vagina: With atrophic changes  Adnexa: Without masses or tenderness    Anus and perineum: Normal   Rectovaginal: Normal sphincter tone without palpated masses or tenderness.    Assessment/Plan:  67 y.o. [id number] female for gynecologic exam.  1. Postmenopausal/atrophic genital changes.  Status post TAH for leiomyoma with [facility] bladder suspension.  Doing well without significant menopausal symptoms. 2. Darker urine with smaller volume.  Urine analysis is consistent with UTI showing moderate bacteriuria with 10-20 WBC 0-5 squamous cells.  Septra DS 1 p.o. twice daily x3 days.  Follow-up if her symptoms persist, worsen or recur. 3. Colonoscopy 2017.  Repeat at their recommended  interval. 4. Mammography 11/2017.  Continue with annual mammography when due.  Breast exam normal today. 5. DEXA 01/2015 normal.  Recommend repeat DEXA at age 67.  Increased calcium vitamin D weightbearing exercise. 6. Pap smear 2012.  No Pap smear done today.  No history of significant abnormal Pap smears.  Reviewed current screening guidelines and we both agree to stop screening based on age and hysterectomy history. 7. Health maintenance.  No routine lab work done as patient does this elsewhere.  Follow-up 1 year, sooner as needed.   Anastasio Auerbach MD, 12:23 PM 12/11/2017

## 2017-12-11 NOTE — Patient Instructions (Addendum)
Take the antibiotics twice daily for 3 days  Follow up in one year for annual exam

## 2017-12-11 NOTE — Addendum Note (Signed)
Addended by: Nelva Nay on: 12/11/2017 12:36 PM   Modules accepted: Orders

## 2017-12-13 LAB — URINALYSIS, COMPLETE W/RFL CULTURE
Bilirubin Urine: NEGATIVE
Glucose, UA: NEGATIVE
HYALINE CAST: NONE SEEN /LPF
Hgb urine dipstick: NEGATIVE
KETONES UR: NEGATIVE
Leukocyte Esterase: NEGATIVE
Nitrites, Initial: NEGATIVE
PROTEIN: NEGATIVE
RBC / HPF: NONE SEEN /HPF (ref 0–2)
SPECIFIC GRAVITY, URINE: 1.02 (ref 1.001–1.03)
pH: 5 (ref 5.0–8.0)

## 2017-12-13 LAB — URINE CULTURE
MICRO NUMBER: 91200250
SPECIMEN QUALITY: ADEQUATE

## 2017-12-13 LAB — CULTURE INDICATED

## 2018-01-04 ENCOUNTER — Other Ambulatory Visit: Payer: Self-pay | Admitting: Family Medicine

## 2018-01-04 DIAGNOSIS — E785 Hyperlipidemia, unspecified: Secondary | ICD-10-CM

## 2018-01-05 ENCOUNTER — Ambulatory Visit: Payer: Medicare Other

## 2018-01-06 ENCOUNTER — Ambulatory Visit (INDEPENDENT_AMBULATORY_CARE_PROVIDER_SITE_OTHER): Payer: Medicare Other

## 2018-01-06 VITALS — BP 110/68 | HR 92 | Temp 97.8°F | Ht 65.0 in | Wt 162.5 lb

## 2018-01-06 DIAGNOSIS — Z0000 Encounter for general adult medical examination without abnormal findings: Secondary | ICD-10-CM | POA: Diagnosis not present

## 2018-01-06 DIAGNOSIS — E785 Hyperlipidemia, unspecified: Secondary | ICD-10-CM

## 2018-01-06 LAB — COMPREHENSIVE METABOLIC PANEL
ALBUMIN: 4.3 g/dL (ref 3.5–5.2)
ALK PHOS: 85 U/L (ref 39–117)
ALT: 22 U/L (ref 0–35)
AST: 19 U/L (ref 0–37)
BILIRUBIN TOTAL: 0.5 mg/dL (ref 0.2–1.2)
BUN: 12 mg/dL (ref 6–23)
CO2: 28 mEq/L (ref 19–32)
Calcium: 9.3 mg/dL (ref 8.4–10.5)
Chloride: 106 mEq/L (ref 96–112)
Creatinine, Ser: 0.81 mg/dL (ref 0.40–1.20)
GFR: 74.67 mL/min (ref >60.00)
Glucose, Bld: 90 mg/dL (ref 70–99)
POTASSIUM: 4.3 meq/L (ref 3.5–5.1)
Sodium: 141 mEq/L (ref 135–145)
TOTAL PROTEIN: 6.6 g/dL (ref 6.0–8.3)

## 2018-01-06 LAB — LIPID PANEL
CHOLESTEROL: 160 mg/dL (ref 0–200)
HDL: 41 mg/dL (ref >39.00)
LDL Cholesterol: 94 mg/dL (ref 0–99)
NonHDL: 119.12
Total CHOL/HDL Ratio: 4
Triglycerides: 125 mg/dL (ref 0.0–149.0)
VLDL: 25 mg/dL (ref 0.0–40.0)

## 2018-01-06 NOTE — Patient Instructions (Addendum)
Ashlee Allen , Thank you for taking time to come for your Medicare Wellness Visit. I appreciate your ongoing commitment to your health goals. Please review the following plan we discussed and let me know if I can assist you in the future.   These are the goals we discussed: Goals    . Increase physical activity     Starting 01/06/2018, I will continue to walking for 20-30 min daily.        This is a list of the screening recommended for you and due dates:  Health Maintenance  Topic Date Due  . Mammogram  11/27/2019  . Colon Cancer Screening  03/28/2020  . Tetanus Vaccine  04/29/2023  . Flu Shot  Completed  . DEXA scan (bone density measurement)  Completed  .  Hepatitis C: One time screening is recommended by Center for Disease Control  (CDC) for  adults born from [birth year] through 1966.   Completed  . Pneumonia vaccines  Completed   Preventive Care for Adults  A healthy lifestyle and preventive care can promote health and wellness. Preventive health guidelines for adults include the following key practices.  . A routine yearly physical is a good way to check with your health care provider about your health and preventive screening. It is a chance to share any concerns and updates on your health and to receive a thorough exam.  . Visit your dentist for a routine exam and preventive care every 6 months. Brush your teeth twice a day and floss once a day. Good oral hygiene prevents tooth decay and gum disease.  . The frequency of eye exams is based on your age, health, family medical history, use  of contact lenses, and other factors. Follow your health care provider's recommendations for frequency of eye exams.  . Eat a healthy diet. Foods like vegetables, fruits, whole grains, low-fat dairy products, and lean protein foods contain the nutrients you need without too many calories. Decrease your intake of foods high in solid fats, added sugars, and salt. Eat the right amount of calories for  you. Get information about a proper diet from your health care provider, if necessary.  . Regular physical exercise is one of the most important things you can do for your health. Most adults should get at least 150 minutes of moderate-intensity exercise (any activity that increases your heart rate and causes you to sweat) each week. In addition, most adults need muscle-strengthening exercises on 2 or more days a week.  Silver Sneakers may be a benefit available to you. To determine eligibility, you may visit the website: www.silversneakers.com or contact program at [phone])208-3734 Mon-Fri between 8AM-8PM.   . Maintain a healthy weight. The body mass index (BMI) is a screening tool to identify possible weight problems. It provides an estimate of body fat based on height and weight. Your health care provider can find your BMI and can help you achieve or maintain a healthy weight.   For adults 20 years and older: ? A BMI below 18.5 is considered underweight. ? A BMI of 18.5 to 24.9 is normal. ? A BMI of 25 to 29.9 is considered overweight. ? A BMI of 30 and above is considered obese.   . Maintain normal blood lipids and cholesterol levels by exercising and minimizing your intake of saturated fat. Eat a balanced diet with plenty of fruit and vegetables. Blood tests for lipids and cholesterol should begin at age 45 and be repeated every 5 years. If your lipid  or cholesterol levels are high, you are over 50, or you are at high risk for heart disease, you may need your cholesterol levels checked more frequently. Ongoing high lipid and cholesterol levels should be treated with medicines if diet and exercise are not working.  . If you smoke, find out from your health care provider how to quit. If you do not use tobacco, please do not start.  . If you choose to drink alcohol, please do not consume more than 2 drinks per day. One drink is considered to be 12 ounces (355 mL) of beer, 5 ounces (148 mL) of  wine, or 1.5 ounces (44 mL) of liquor.  . If you are 9-71 years old, ask your health care provider if you should take aspirin to prevent strokes.  . Use sunscreen. Apply sunscreen liberally and repeatedly throughout the day. You should seek shade when your shadow is shorter than you. Protect yourself by wearing long sleeves, pants, a wide-brimmed hat, and sunglasses year round, whenever you are outdoors.  . Once a month, do a whole body skin exam, using a mirror to look at the skin on your back. Tell your health care provider of new moles, moles that have irregular borders, moles that are larger than a pencil eraser, or moles that have changed in shape or color.

## 2018-01-06 NOTE — Progress Notes (Signed)
PCP notes:   Health maintenance:  No gaps identified.   Abnormal screenings:   None  Patient concerns:   None  Nurse concerns:  None  Next PCP appt:   01/14/18 @ 0945

## 2018-01-06 NOTE — Progress Notes (Signed)
Subjective:   Ashlee Allen is a 67 y.o. female who presents for Medicare Annual (Subsequent) preventive examination.  Review of Systems:  N/A Cardiac Risk Factors include: advanced age (>67men, >99 women);smoking/ tobacco exposure;dyslipidemia     Objective:     Vitals: BP 110/68 (BP Location: Right Arm, Patient Position: Sitting, Cuff Size: Normal)   Pulse 92   Temp 97.8 F (36.6 C) (Oral)   Ht 5\' 5"  (1.651 m) Comment: no shoes  Wt 162 lb 8 oz (73.7 kg)   SpO2 95%   BMI 27.04 kg/m   Body mass index is 27.04 kg/m.  Advanced Directives 01/06/2018 01/02/2017 04/22/2016 04/21/2016 12/19/2015 05/17/2013  Does Patient Have a Medical Advance Directive? Yes Yes Yes No Yes Patient does not have advance directive;Patient would not like information  Type of Scientist, forensic Power of [place];Living will [place];Living will Living will - [place];Living will -  Does patient want to make changes to medical advance directive? - - No - Patient declined - No - Patient declined -  Copy of [place] in Chart? No - copy requested No - copy requested - - No - copy requested -    Tobacco Social History   Tobacco Use  Smoking Status Current Every Day Smoker  . Packs/day: 0.40  . Years: 40.00  . Pack years: 16.00  Smokeless Tobacco Never Used     Ready to quit: No Counseling given: No   Clinical Intake:  Pre-visit preparation completed: Yes  Pain : No/denies pain Pain Score: 0-No pain     Nutritional Status: BMI 25 -29 Overweight Nutritional Risks: None Diabetes: No  How often do you need to have someone help you when you read instructions, pamphlets, or other written materials from your doctor or pharmacy?: 1 - Never What is the last grade level you completed in school?: Bachelor degree  Interpreter Needed?: No  Comments: pt lives with spouse Information entered by :: LPinson, LPN  Past Medical  History:  Diagnosis Date  . Cardiac murmur    echo prev done  . Depression   . Discoid lupus    Dr. Kalman Shan (prev [facility] at [facility] and Dr. Ubaldo Glassing  . Fibroid   . GERD (gastroesophageal reflux disease)    controllled with weight loss   . Pulmonary nodule    Past Surgical History:  Procedure Laterality Date  . ABDOMINAL HYSTERECTOMY  1997   TAH  [place]  . BREAST SURGERY  2001   Duct excised-rt  . CATARACT EXTRACTION, BILATERAL    . CHOLECYSTECTOMY  1991  . COLONOSCOPY    . HAND SURGERY  2001   ctr-both  . INNER EAR SURGERY     left- mastoidectomy  . KNEE ARTHROSCOPY Left 05/20/2013   Procedure: LEFT KNEE ARTHROSCOPY WITH DEBRIDEMENT/SHAVING (CHONDROPLASTY) AND LATERAL AND MEDIAL MENISECTOMY, EXCISION OF PLICA;  Surgeon: Yvette Rack., MD;  Location: [facility];  Service: Orthopedics;  Laterality: Left;  . LAPAROSCOPIC APPENDECTOMY N/A 04/22/2016   Procedure: APPENDECTOMY LAPAROSCOPIC;  Surgeon: Alphonsa Overall, MD;  Location: [facility];  Service: General;  Laterality: N/A;   Family History  Problem Relation Age of Onset  . Alcohol abuse Mother   . Hyperlipidemia Mother   . Hypertension Mother   . Diabetes Mother   . Cancer Mother        LUNG- SMOKER   . Alcohol abuse Father   . Cancer Father  lung  . Seizures Brother   . Colon cancer Neg Hx   . Breast cancer Neg Hx    Social History   Socioeconomic History  . Marital status: Married    Spouse name: Not on file  . Number of children: 2  . Years of education: Not on file  . Highest education level: Not on file  Occupational History  . 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 Financial resource strain: Not on file  . Food insecurity:    Worry: Not on file    Inability: Not on file  . Transportation needs:    Medical: Not on file    Non-medical: Not on file  Tobacco Use  . Smoking status: Current Every Day Smoker    Packs/day: 0.40    Years: 40.00    Pack years:  16.00  . Smokeless tobacco: Never Used  Substance and Sexual Activity  . Alcohol use: No    Alcohol/week: 0.0 standard drinks  . Drug use: No  . Sexual activity: Yes    Birth control/protection: Surgical    Comment: 1 partner - less than 5   Lifestyle  . Physical activity:    Days per week: Not on file    Minutes per session: Not on file  . Stress: Not on file  Relationships  . Social connections:    Talks on phone: Not on file    Gets together: Not on file    Attends religious service: Not on file    Active member of club or organization: Not on file    Attends meetings of clubs or organizations: Not on file    Relationship status: Not on file  Other Topics Concern  . Not on file  Social History Narrative   Walking for exercise   Married 1978   Retired from teaching/coaching    Outpatient Encounter Medications as of 01/06/2018  Medication Sig  . calcium carbonate (TUMS) 500 MG chewable tablet Chew 1 tablet by mouth as needed for indigestion or heartburn.  . hydroxychloroquine (PLAQUENIL) 200 MG tablet 1-2 tabs per day (wintertime 1 tab a day, summertime 2 tabs a day)  . [DISCONTINUED] betamethasone dipropionate (DIPROLENE) 0.05 % cream Apply topically 2 (two) times daily.  . [DISCONTINUED] sulfamethoxazole-trimethoprim (BACTRIM DS,SEPTRA DS) 800-160 MG tablet Take 1 tablet by mouth 2 (two) times daily.  Marland Kitchen atorvastatin (LIPITOR) 10 MG tablet Take 0.5 tablets (5 mg total) by mouth daily. (Patient not taking: Reported on 01/06/2018)  . [DISCONTINUED] Omeprazole (PRILOSEC PO) Take by mouth as needed.     No facility-administered encounter medications on file as of 01/06/2018.     Activities of Daily Living In your present state of health, do you have any difficulty performing the following activities: 01/06/2018  Hearing? N  Vision? N  Difficulty concentrating or making decisions? N  Walking or climbing stairs? N  Dressing or bathing? N  Doing errands, shopping? N    Preparing Food and eating ? N  Using the Toilet? N  In the past six months, have you accidently leaked urine? N  Do you have problems with loss of bowel control? N  Managing your Medications? N  Managing your Finances? N  Housekeeping or managing your Housekeeping? N  Some recent data might be hidden    Patient Care Team: Tonia Ghent, MD as PCP - General [place], Heather, [place] as Referring Physician (Optometry)    Assessment:   This is  a routine wellness examination for Addilynne.  Hearing Screening Comments: Bilateral hearing aids Vision Screening Comments: Vision exam in Spring 2019 with Dr. [place]  Exercise Activities and Dietary recommendations Current Exercise Habits: Home exercise routine, Type of exercise: walking, Time (Minutes): 25, Frequency (Times/Week): 7, Weekly Exercise (Minutes/Week): 175, Intensity: Mild, Exercise limited by: None identified  Goals    . Increase physical activity     Starting 01/06/2018, I will continue to walking for 20-30 min daily.        Fall Risk Fall Risk  01/06/2018 01/02/2017 12/19/2015 12/11/2014  Falls in the past year? No No No No   Depression Screen PHQ 2/9 Scores 01/06/2018 01/02/2017 12/19/2015 12/11/2014  PHQ - 2 Score 0 0 0 0  PHQ- 9 Score 0 0 - -     Cognitive Function MMSE - Mini Mental State Exam 01/06/2018 01/02/2017 12/19/2015  Orientation to time 5 5 5   Orientation to Place 5 5 5   Registration 3 3 3   Attention/ Calculation 0 0 0  Recall 3 3 3   Language- name 2 objects 0 0 0  Language- repeat 1 1 1   Language- follow 3 step command 3 3 3   Language- read & follow direction 0 0 0  Write a sentence 0 0 0  Copy design 0 0 0  Total score 20 20 20      PLEASE NOTE: A Mini-Cog screen was completed. Maximum score is 20. A value of 0 denotes this part of [place] MMSE was not completed or the patient failed this part of the Mini-Cog screening.   Mini-Cog Screening Orientation to Time - Max 5 pts Orientation to Place - Max  5 pts Registration - Max 3 pts Recall - Max 3 pts Language Repeat - Max 1 pts Language Follow 3 Step Command - Max 3 pts     Immunization History  Administered Date(s) Administered  . Influenza Split 12/25/2010, 12/17/2011, 12/02/2012  . Influenza Whole 01/08/2010, 12/17/2011  . Influenza, High Dose Seasonal PF 01/02/2017  . Influenza,inj,Quad PF,6+ Mos 12/11/2014, 12/19/2015  . Influenza-Unspecified 12/07/2013, 12/08/2017  . Pneumococcal Conjugate-13 12/11/2014  . Pneumococcal Polysaccharide-23 12/19/2015  . Td 08/24/2007  . Tdap 04/28/2013   Screening Tests Health Maintenance  Topic Date Due  . MAMMOGRAM  11/27/2019  . COLONOSCOPY  03/28/2020  . TETANUS/TDAP  04/29/2023  . INFLUENZA VACCINE  Completed  . DEXA SCAN  Completed  . Hepatitis C Screening  Completed  . PNA vac Low Risk Adult  Completed      Plan:     I have personally reviewed, addressed, and noted the following in the patient's chart:  A. Medical and social history B. Use of alcohol, tobacco or illicit drugs  C. Current medications and supplements D. Functional ability and status E.  Nutritional status F.  Physical activity G. Advance directives H. List of other physicians I.  Hospitalizations, surgeries, and ER visits in previous 12 months J.  [place] to include hearing, vision, cognitive, depression L. Referrals and appointments - none  In addition, I have reviewed and discussed with patient certain preventive protocols, quality metrics, and best practice recommendations. A written personalized care plan for preventive services as well as general preventive health recommendations were provided to patient.  See attached scanned questionnaire for additional information.   Signed,   Lindell Noe, MHA, BS, LPN Health Coach

## 2018-01-07 NOTE — Progress Notes (Signed)
I reviewed health advisor's note, was available for consultation, and agree with documentation and plan.  

## 2018-01-14 ENCOUNTER — Ambulatory Visit (INDEPENDENT_AMBULATORY_CARE_PROVIDER_SITE_OTHER): Payer: Medicare Other | Admitting: Family Medicine

## 2018-01-14 ENCOUNTER — Encounter: Payer: Self-pay | Admitting: Family Medicine

## 2018-01-14 VITALS — BP 110/68 | HR 92 | Temp 97.8°F | Ht 65.0 in | Wt 162.5 lb

## 2018-01-14 DIAGNOSIS — L930 Discoid lupus erythematosus: Secondary | ICD-10-CM

## 2018-01-14 DIAGNOSIS — E785 Hyperlipidemia, unspecified: Secondary | ICD-10-CM | POA: Diagnosis not present

## 2018-01-14 DIAGNOSIS — F17200 Nicotine dependence, unspecified, uncomplicated: Secondary | ICD-10-CM

## 2018-01-14 DIAGNOSIS — R911 Solitary pulmonary nodule: Secondary | ICD-10-CM | POA: Diagnosis not present

## 2018-01-14 DIAGNOSIS — Z7189 Other specified counseling: Secondary | ICD-10-CM

## 2018-01-14 DIAGNOSIS — Z0000 Encounter for general adult medical examination without abnormal findings: Secondary | ICD-10-CM

## 2018-01-14 MED ORDER — ATORVASTATIN CALCIUM 10 MG PO TABS: 5.0000 mg | ORAL_TABLET | ORAL | Status: DC

## 2018-01-14 NOTE — Progress Notes (Signed)
Elevated Cholesterol: Using medications without problems: off statin.   Muscle aches: see below.  Diet compliance: yes Exercise: yes She had aches on lipitor even at 5mg  a day.    Smoking cessation d/w pt.   Flu up to date Shingles d/w pt.  Out of stock.   PNA UTD Tetanus 2015 Colonoscopy 2017 Breast cancer screening 2019 Pap declined.   DXA 2016, wnl.  d/w pt.  Defer in 2019.   Advance directive- husband designated if patient were incapacitated.   Cutaneous lupus per derm clinic.    Pulmonary nodule hx noted, no cough, no hemoptysis.  Due for f/u CT in ~04/2018, d/w pt.   not due for follow-up at this point.  PMH and SH reviewed  Meds, vitals, and allergies reviewed.   ROS: Per HPI unless specifically indicated in ROS section   GEN: nad, alert and oriented HEENT: mucous membranes moist, B TM perf at baseline.   NECK: supple w/o LA CV: rrr. PULM: ctab, no inc wob ABD: soft, +bs EXT: no edema SKIN: no acute rash

## 2018-01-14 NOTE — Patient Instructions (Signed)
Try taking 5-10mg  of lipitor once or twice a week.  Can start with 10mg  once a week.   Update me as needed.  If you have troubles, then you can try pravastatin.   Take care.  Glad to see you.

## 2018-01-17 NOTE — Assessment & Plan Note (Signed)
Discussed smoking cessation.  Pre-contemplative.

## 2018-01-17 NOTE — Assessment & Plan Note (Signed)
Advance directive- husband designated if patient were incapacitated.  

## 2018-01-17 NOTE — Assessment & Plan Note (Signed)
She had aches on Lipitor even a 5 mg a day.  We discussed options.  She can try taking it once or twice a week.  She can vary the dose from 5 to 10 mg per dose.  She will see what dose she can tolerate.  If she cannot tolerate 5 or 10 mg even once a week we can consider a trial of pravastatin.  Discussed with patient.  She agrees.  She will update me as needed.  Labs discussed with patient. >25 minutes spent in face to face time with patient, >50% spent in counselling or coordination of care.

## 2018-01-17 NOTE — Assessment & Plan Note (Addendum)
Smoking cessation d/w pt.   Flu up to date Shingles d/w pt.  Out of stock.   PNA UTD Tetanus 2015 Colonoscopy 2017 Breast cancer screening 2019 Pap declined.   DXA 2016, wnl.  d/w pt.  Defer in 2019.   Advance directive- husband designated if patient were incapacitated.

## 2018-01-17 NOTE — Assessment & Plan Note (Signed)
Per dermatology.  I will defer.  She agrees.

## 2018-01-17 NOTE — Assessment & Plan Note (Signed)
Pulmonary nodule hx noted, no cough, no hemoptysis.  Due for f/u CT in ~04/2018, d/w pt.   not due for follow-up at this point.  Discussed.

## 2018-02-18 ENCOUNTER — Encounter: Payer: Self-pay | Admitting: Family Medicine

## 2018-02-18 ENCOUNTER — Ambulatory Visit: Payer: Medicare Other | Admitting: Family Medicine

## 2018-02-18 DIAGNOSIS — M542 Cervicalgia: Secondary | ICD-10-CM | POA: Diagnosis not present

## 2018-02-18 MED ORDER — DIAZEPAM 5 MG PO TABS: 2.5000 mg | ORAL_TABLET | Freq: Two times a day (BID) | ORAL | 0 refills | Status: DC | PRN

## 2018-02-18 MED ORDER — PREDNISONE 20 MG PO TABS: ORAL_TABLET | ORAL | 0 refills | Status: DC

## 2018-02-18 NOTE — Patient Instructions (Signed)
Prednisone with food.  Okay to continue tylenol.  Sedation caution on valium.  Use heat as needed.  Update me as needed.  Take care.  Glad to see you.

## 2018-02-18 NOTE — Progress Notes (Signed)
She is taking lipitor 10mg  weekly.  She skipped it this week.    Symptoms started about 3 weeks ago.  No clear trigger but she had some pain decorating for xmas.  She noted some cracking sounds in her neck with ROM.   She tried tylenol and ibuprofen, heat.  No help in the meantime.  Pain on L side of the posterior neck. And L upper back, just to the side of the midline medial to the scapula on the L side.    No trauma.  No falls.  Grip wnl B.  No rash.  No numbness.  No R sided sx.    Meds, vitals, and allergies reviewed.   ROS: Per HPI unless specifically indicated in ROS section   nad ncat She still has normal neck ROM- not a stiff neck.  She has paraspinal posterior cervical spine muscle tightness without rash or bruising. rrr ctab Grossly normal strength and sensation in the arms and legs.

## 2018-02-22 DIAGNOSIS — M542 Cervicalgia: Secondary | ICD-10-CM | POA: Insufficient documentation

## 2018-02-22 NOTE — Assessment & Plan Note (Signed)
No emergent symptoms.  Discussed options.  She could have a flare of arthritis and concurrent muscle spasm. Prednisone with food.  Discussed routine steroid cautions.  Okay to continue tylenol.  Sedation caution on valium as a muscle relaxer. Use heat as needed.  Update me as needed.  She agrees.  Okay for outpatient follow-up.

## 2018-02-23 ENCOUNTER — Ambulatory Visit: Payer: Medicare Other | Admitting: Family Medicine

## 2018-02-26 ENCOUNTER — Ambulatory Visit: Payer: Medicare Other | Admitting: Family Medicine

## 2018-02-26 ENCOUNTER — Encounter: Payer: Self-pay | Admitting: Family Medicine

## 2018-02-26 DIAGNOSIS — M542 Cervicalgia: Secondary | ICD-10-CM | POA: Diagnosis not present

## 2018-02-26 MED ORDER — PREDNISONE 20 MG PO TABS: ORAL_TABLET | ORAL | 0 refills | Status: DC

## 2018-02-26 MED ORDER — GABAPENTIN 100 MG PO CAPS: 100.0000 mg | ORAL_CAPSULE | Freq: Three times a day (TID) | ORAL | 1 refills | Status: DC | PRN

## 2018-02-26 NOTE — Progress Notes (Signed)
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   Discussed.  L arm burning pain, she thought she was having dermatomal pain from neck irritation.  No trauma.  L arm is better than last week.    She has been taking prednisone and has been using valium at night.  She got some relief with valium but wasn't drowsy from that.    She isn't on lipitor so that isn't the causative issue.  She is some better overall.  We discussed options.  Meds, vitals, and allergies reviewed.  ROS: Per HPI unless specifically indicated in ROS section   nad ncat She still has normal neck ROM- not a stiff neck.  She has paraspinal posterior cervical spine muscle tightness without rash or bruising-this is slightly better than previous. rrr ctab Grossly normal strength and sensation in the arms and legs. Able to walk and bear weight like normal.

## 2018-02-26 NOTE — Patient Instructions (Signed)
Restart prednisone taper.  Hold valium.  Try gradually escalating dose of gabapentin as needed for pain.  Update me as needed.  Take care.  Glad to see you.

## 2018-02-28 NOTE — Assessment & Plan Note (Signed)
Discussed options.  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  Discussed options.  No weakness.  Still okay for outpatient follow-up. Restart prednisone taper.  Routine steroid cautions discussed with patient. Hold valium.  Try gradually escalating dose of gabapentin as needed for pain.  That may help with the arm pain. Update me as needed.  She agrees with plan.  Okay for outpatient follow-up.  No weakness.

## 2018-04-08 ENCOUNTER — Ambulatory Visit: Payer: Medicare Other | Admitting: Family Medicine

## 2018-04-08 ENCOUNTER — Encounter: Payer: Self-pay | Admitting: Family Medicine

## 2018-04-08 ENCOUNTER — Ambulatory Visit (INDEPENDENT_AMBULATORY_CARE_PROVIDER_SITE_OTHER)
Admission: RE | Admit: 2018-04-08 | Discharge: 2018-04-08 | Disposition: A | Discharge status: Home / Self Care | Payer: Medicare Other | Source: Ambulatory Visit | Attending: Family Medicine | Admitting: Family Medicine

## 2018-04-08 VITALS — BP 122/70 | HR 104 | Temp 98.6°F | Ht 65.0 in

## 2018-04-08 DIAGNOSIS — M79602 Pain in left arm: Secondary | ICD-10-CM

## 2018-04-08 NOTE — Patient Instructions (Signed)
Gradually decrease the gabapentin and see if you note a change.  Okay to re-increase if needed.   Go to the lab on the way out.  We'll contact you with your xray report. We'll go from there.  It is possible that you may need an MRI later on.   Take care.  Glad to see you.

## 2018-04-08 NOTE — Progress Notes (Signed)
Neck pain.  She stopped valium when she started gabapentin. Prev OV notes d/w pt.  S/p prednisone course and that seemed to help more than anything else.    She finished prednisone on 03/08/2018.  She is taking gabapentin 100mg  TID.    She hasn't restarted statin yet, to prevent that from confusing the situation with her neck pain.  That is reasonable.  Discussed.    She was seen 03/09/18 for flu like sx with fever.  That resolved.  No fever and fatigue now.    She still has pain radiating down the L arm, continued from prev.  No R arm sx.  No weakness.  No rash.    Meds, vitals, and allergies reviewed.   ROS: Per HPI unless specifically indicated in ROS section   nad ncat Neck supple, no LA rrr ctab S/S wnl BUE She has tenderness w/o rash along L scapular area.   Skin well perfused.  Grip normal bilaterally.  Normal gait.

## 2018-04-09 ENCOUNTER — Other Ambulatory Visit: Payer: Self-pay | Admitting: Family Medicine

## 2018-04-09 DIAGNOSIS — M79602 Pain in left arm: Secondary | ICD-10-CM

## 2018-04-09 MED ORDER — PREDNISONE 20 MG PO TABS: ORAL_TABLET | ORAL | 0 refills | Status: DC

## 2018-04-11 DIAGNOSIS — M79602 Pain in left arm: Secondary | ICD-10-CM | POA: Insufficient documentation

## 2018-04-11 NOTE — Assessment & Plan Note (Addendum)
Imaging reviewed.She has chronic degenerative changes. No acute changes. I would restart prednisone with food, get MR neck set up, and refer to neurosurgery.  See notes on imaging.  We had talked about this possible plan at the office visit before I saw her films.  She agreed with the plan at the office visit.  Okay for outpatient follow-up.

## 2018-04-12 ENCOUNTER — Telehealth: Payer: Self-pay | Admitting: Family Medicine

## 2018-04-12 DIAGNOSIS — R918 Other nonspecific abnormal finding of lung field: Secondary | ICD-10-CM

## 2018-04-12 NOTE — Telephone Encounter (Signed)
I did not realize this when I was working on her other issues recently, but she is due for a follow-up chest CT.  I now see the reminder in the EMR.  Put in the order for that also.  Thanks.

## 2018-04-12 NOTE — Telephone Encounter (Addendum)
Rosaria Ferries caught it and set that up as well.  Patient says Hello.

## 2018-04-13 NOTE — Telephone Encounter (Signed)
Noted. Thanks.

## 2018-04-14 ENCOUNTER — Ambulatory Visit (HOSPITAL_COMMUNITY)
Admission: RE | Admit: 2018-04-14 | Discharge: 2018-04-14 | Disposition: A | Discharge status: Home / Self Care | Payer: Medicare Other | Source: Ambulatory Visit | Attending: Family Medicine | Admitting: Family Medicine

## 2018-04-14 DIAGNOSIS — M79602 Pain in left arm: Secondary | ICD-10-CM

## 2018-04-26 ENCOUNTER — Telehealth: Payer: Self-pay | Admitting: *Deleted

## 2018-04-26 ENCOUNTER — Ambulatory Visit (INDEPENDENT_AMBULATORY_CARE_PROVIDER_SITE_OTHER)
Admission: RE | Admit: 2018-04-26 | Discharge: 2018-04-26 | Disposition: A | Discharge status: Home / Self Care | Payer: Medicare Other | Source: Ambulatory Visit | Attending: Family Medicine | Admitting: Family Medicine

## 2018-04-26 DIAGNOSIS — R918 Other nonspecific abnormal finding of lung field: Secondary | ICD-10-CM | POA: Diagnosis not present

## 2018-04-26 NOTE — Telephone Encounter (Signed)
Patient left a voicemail stating that she had her CT scan and that she is going to be out a lot this week and would like results called to her cell phone. Patient stated that she has not heard anything back about her neurologist referral, but she is better since finishing the Prednisone.

## 2018-05-04 NOTE — Telephone Encounter (Signed)
Please sign and close encounter when completed. 

## 2018-05-25 ENCOUNTER — Other Ambulatory Visit: Payer: Self-pay

## 2018-05-25 ENCOUNTER — Encounter: Payer: Self-pay | Admitting: Family Medicine

## 2018-05-25 ENCOUNTER — Ambulatory Visit: Payer: Medicare Other | Admitting: Family Medicine

## 2018-05-25 VITALS — BP 116/58 | HR 102 | Temp 98.8°F | Ht 65.0 in | Wt 161.3 lb

## 2018-05-25 DIAGNOSIS — J069 Acute upper respiratory infection, unspecified: Secondary | ICD-10-CM | POA: Diagnosis not present

## 2018-05-25 DIAGNOSIS — R05 Cough: Secondary | ICD-10-CM

## 2018-05-25 DIAGNOSIS — R059 Cough, unspecified: Secondary | ICD-10-CM

## 2018-05-25 LAB — POC INFLUENZA A&B (BINAX/QUICKVUE)
Influenza A, POC: NEGATIVE
Influenza B, POC: NEGATIVE

## 2018-05-25 NOTE — Patient Instructions (Signed)
Flu test negative.  Presumed self limiting viral illness.  Rest and fluids.  Update [place] as needed.  Take care.  Glad to see you.

## 2018-05-25 NOTE — Addendum Note (Signed)
Addended by: Josetta Huddle on: 05/25/2018 03:46 PM   Modules accepted: Orders

## 2018-05-25 NOTE — Progress Notes (Signed)
She didn't tolerate 10mg  lipitor.  She may try 5mg  in the future, but when well and not now.    Sx started about 3 days ago.  Head cold, with congestion, fever yesterday up to 100.9.  Taking tylenol for fever, with relief.  No vomiting.  Some diarrhea.  No blood in stool.  No abd pain.  Scant clear sputum.  No ear pain.  Maxillary pain noted bilaterally, improved with tylenol.  Some occ wheeze.  No recent SABA use.  Not SOB.  Some aches, not severe.  Fatigued.    Had a flu shot in the fall.    No travel or known coronavirus exposure.    Meds, vitals, and allergies reviewed.   ROS: Per HPI unless specifically indicated in ROS section   GEN: nad, alert and oriented HEENT: mucous membranes moist, tm w/o erythema but B old TM perfs noted- at baseline, nasal exam w/o erythema, clear discharge noted,  OP with cobblestoning NECK: supple w/o LA CV: rrr.   PULM: ctab, no inc wob EXT: no edema SKIN: well perfused.

## 2018-05-25 NOTE — Assessment & Plan Note (Addendum)
Flu test negative.  Presumed self limiting viral illness.  Rest and fluids.  Update [place] as needed.  She agrees with plan.  Nontoxic.  Okay for outpatient f/u.

## 2018-05-28 ENCOUNTER — Telehealth: Payer: Self-pay | Admitting: *Deleted

## 2018-05-28 MED ORDER — AMOXICILLIN-POT CLAVULANATE 875-125 MG PO TABS: 1.0000 | ORAL_TABLET | Freq: Two times a day (BID) | ORAL | 0 refills | Status: DC

## 2018-05-28 NOTE — Telephone Encounter (Signed)
Patient stated that she was in Tuesday with a head cold. Patient stated that at that time she was having some clear drainage out of her left ear. Patient stated that now the drainage is so bad that she is having to keep a cotton ball in the ear. Patient stated that the fluid was clear and now is clear to yellow and wants to know if you should send her medication in for this? Patient stated that her concern is because she had surgery on that ear years ago. Patient stated that she has been fever free for 24 hours. Pharmacy- [facility]

## 2018-05-28 NOTE — Telephone Encounter (Signed)
Given the duration, her hx, recently sx would start abx.  rx sent.  Update [place] as needed.  Thanks.

## 2018-05-28 NOTE — Telephone Encounter (Signed)
Patient advised.

## 2018-07-19 DIAGNOSIS — K219 Gastro-esophageal reflux disease without esophagitis: Secondary | ICD-10-CM | POA: Insufficient documentation

## 2018-09-13 ENCOUNTER — Encounter: Payer: Self-pay | Admitting: Family Medicine

## 2018-09-13 ENCOUNTER — Ambulatory Visit (INDEPENDENT_AMBULATORY_CARE_PROVIDER_SITE_OTHER): Payer: Medicare Other | Admitting: Family Medicine

## 2018-09-13 DIAGNOSIS — R509 Fever, unspecified: Secondary | ICD-10-CM | POA: Diagnosis not present

## 2018-09-13 DIAGNOSIS — R8290 Unspecified abnormal findings in urine: Secondary | ICD-10-CM

## 2018-09-13 NOTE — Progress Notes (Signed)
Virtual Visit via Video Note  I connected with Ashlee Allen on 09/13/18 at  2:00 PM EDT by a video enabled telemedicine application and verified that I am speaking with the correct person using two identifiers.  Location: Patient: At her home Provider: [place]   I discussed the limitations of evaluation and management by telemedicine and the availability of in person appointments. The patient expressed understanding and agreed to proceed.  History of Present Illness: This is a 68 year old female who requests virtual visit today to discuss low-grade temperature and possible UTI.  She has had several days of malodorous urine.  This is her typical symptom for UTI.  She really has not had significant dysuria, hematuria or itching.  She has what she describes as a smoker's cough.  This is unchanged.  She is not having any shortness of breath or wheeze.  She is on prednisone for a flare of her back and neck pain.  She saw her neurosurgeon last week.  She has been taking Tylenol with relief of temperature.  Temperature was 100.4 last night and 100.9 early this morning.  She has not had any additional Tylenol and is currently fever free.  She denies nausea, vomiting, change in taste or smell.  Past Medical History:  Diagnosis Date  . Cardiac murmur    echo prev done  . Depression   . Discoid lupus    Dr. Kalman Shan (prev seen at [facility]) and Dr. Ubaldo Glassing  . Fibroid   . GERD (gastroesophageal reflux disease)    controllled with weight loss   . Hyperlipidemia   . Pulmonary nodule    Past Surgical History:  Procedure Laterality Date  . ABDOMINAL HYSTERECTOMY  1997   [facility]  [place]  . BREAST SURGERY  2001   Duct excised-rt  . CATARACT EXTRACTION, BILATERAL    . CHOLECYSTECTOMY  1991  . COLONOSCOPY    . HAND SURGERY  2001   ctr-both  . INNER EAR SURGERY     left- mastoidectomy  . KNEE ARTHROSCOPY Left 05/20/2013   Procedure: LEFT KNEE ARTHROSCOPY WITH DEBRIDEMENT/SHAVING (CHONDROPLASTY) AND  LATERAL AND MEDIAL MENISECTOMY, EXCISION OF PLICA;  Surgeon: Yvette Rack., MD;  Location: [place];  Service: Orthopedics;  Laterality: Left;  . LAPAROSCOPIC APPENDECTOMY N/A 04/22/2016   Procedure: APPENDECTOMY LAPAROSCOPIC;  Surgeon: Alphonsa Overall, MD;  Location: [facility];  Service: General;  Laterality: N/A;   Family History  Problem Relation Age of Onset  . Alcohol abuse Mother   . Hyperlipidemia Mother   . Hypertension Mother   . Diabetes Mother   . Cancer Mother        LUNG- SMOKER   . Alcohol abuse Father   . Cancer Father        lung  . Seizures Brother   . Colon cancer Neg Hx   . Breast cancer Neg Hx    Social History   Tobacco Use  . Smoking status: Current Every Day Smoker    Packs/day: 0.40    Years: 40.00    Pack years: 16.00  . Smokeless tobacco: Never Used  Substance Use Topics  . Alcohol use: No    Alcohol/week: 0.0 standard drinks  . Drug use: No      Observations/Objective: The patient is alert and answers questions appropriately.  She is ambulating around her home without difficulty.  She is normally conversive without shortness of breath, audible wheeze or witnessed cough.  Visible skin is unremarkable.  Mood  and affect are appropriate.  There were no vitals taken for this visit. Wt Readings from Last 3 Encounters:  05/25/18 161 lb 5 oz (73.2 kg)  02/26/18 162 lb (73.5 kg)  02/18/18 164 lb 4 oz (74.5 kg)   BP Readings from Last 3 Encounters:  05/25/18 (!) 116/58  04/08/18 122/70  02/26/18 122/76    Assessment and Plan: 1. Elevated temperature - unclear etiology, will check urine. Continue acetaminophen prn, good fluid intake. She denies new cough, abdominal pain.   2. Abnormal urine odor - she will bring a urine sample to the office tomorrow. - Urinalysis with Culture, if indicated; Future   Clarene Reamer, FNP-BC   [facility] at [facility]  09/13/2018 2:20 PM   Follow Up  Instructions: Recap of virtual visit sent to patient via my chart   I discussed the assessment and treatment plan with the patient. The patient was provided an opportunity to ask questions and all were answered. The patient agreed with the plan and demonstrated an understanding of the instructions.   The patient was advised to call back or seek an in-person evaluation if the symptoms worsen or if the condition fails to improve as anticipated.  Elby Beck, FNP

## 2018-09-14 ENCOUNTER — Other Ambulatory Visit: Payer: Medicare Other

## 2018-09-14 ENCOUNTER — Other Ambulatory Visit (INDEPENDENT_AMBULATORY_CARE_PROVIDER_SITE_OTHER): Payer: Medicare Other

## 2018-09-14 DIAGNOSIS — R8290 Unspecified abnormal findings in urine: Secondary | ICD-10-CM

## 2018-09-14 DIAGNOSIS — N3090 Cystitis, unspecified without hematuria: Secondary | ICD-10-CM

## 2018-09-14 LAB — URINALYSIS WITH CULTURE, IF INDICATED
Bilirubin Urine: NEGATIVE
Ketones, ur: NEGATIVE
Nitrite: NEGATIVE
Specific Gravity, Urine: <=1.005 — AB (ref 1.000–1.030)
Total Protein, Urine: NEGATIVE
Urine Glucose: NEGATIVE
Urobilinogen, UA: 0.2 (ref 0.0–1.0)
pH: 6 (ref 5.0–8.0)

## 2018-09-16 LAB — URINE CULTURE
MICRO NUMBER:: 641267
SPECIMEN QUALITY:: ADEQUATE

## 2018-09-16 MED ORDER — SULFAMETHOXAZOLE-TRIMETHOPRIM 800-160 MG PO TABS: 1.0000 | ORAL_TABLET | Freq: Two times a day (BID) | ORAL | 0 refills | Status: DC

## 2018-11-16 ENCOUNTER — Ambulatory Visit: Payer: Medicare Other

## 2018-11-19 ENCOUNTER — Ambulatory Visit (INDEPENDENT_AMBULATORY_CARE_PROVIDER_SITE_OTHER): Payer: Medicare Other

## 2018-11-19 DIAGNOSIS — Z23 Encounter for immunization: Secondary | ICD-10-CM

## 2018-12-15 ENCOUNTER — Encounter: Payer: Self-pay | Admitting: Gynecology

## 2018-12-21 LAB — HM MAMMOGRAPHY

## 2019-01-04 ENCOUNTER — Encounter: Payer: Self-pay | Admitting: Family Medicine

## 2019-01-11 ENCOUNTER — Other Ambulatory Visit: Payer: Self-pay | Admitting: Family Medicine

## 2019-01-11 ENCOUNTER — Other Ambulatory Visit: Payer: Self-pay

## 2019-01-11 ENCOUNTER — Other Ambulatory Visit (INDEPENDENT_AMBULATORY_CARE_PROVIDER_SITE_OTHER): Payer: Medicare Other

## 2019-01-11 DIAGNOSIS — E785 Hyperlipidemia, unspecified: Secondary | ICD-10-CM | POA: Diagnosis not present

## 2019-01-11 LAB — LDL CHOLESTEROL, DIRECT: Direct LDL: 96 mg/dL

## 2019-01-11 LAB — COMPREHENSIVE METABOLIC PANEL
ALT: 14 U/L (ref 0–35)
AST: 17 U/L (ref 0–37)
Albumin: 4.2 g/dL (ref 3.5–5.2)
Alkaline Phosphatase: 75 U/L (ref 39–117)
BUN: 11 mg/dL (ref 6–23)
CO2: 29 mEq/L (ref 19–32)
Calcium: 8.9 mg/dL (ref 8.4–10.5)
Chloride: 104 mEq/L (ref 96–112)
Creatinine, Ser: 0.74 mg/dL (ref 0.40–1.20)
GFR: 77.75 mL/min (ref >60.00)
Glucose, Bld: 79 mg/dL (ref 70–99)
Potassium: 3.6 mEq/L (ref 3.5–5.1)
Sodium: 139 mEq/L (ref 135–145)
Total Bilirubin: 0.5 mg/dL (ref 0.2–1.2)
Total Protein: 6.4 g/dL (ref 6.0–8.3)

## 2019-01-11 LAB — LIPID PANEL
Cholesterol: 162 mg/dL (ref 0–200)
HDL: 41.4 mg/dL (ref >39.00)
NonHDL: 120.12
Total CHOL/HDL Ratio: 4
Triglycerides: 237 mg/dL — ABNORMAL HIGH (ref 0.0–149.0)
VLDL: 47.4 mg/dL — ABNORMAL HIGH (ref 0.0–40.0)

## 2019-01-12 ENCOUNTER — Ambulatory Visit (INDEPENDENT_AMBULATORY_CARE_PROVIDER_SITE_OTHER): Payer: Medicare Other

## 2019-01-12 DIAGNOSIS — Z0000 Encounter for general adult medical examination without abnormal findings: Secondary | ICD-10-CM | POA: Diagnosis not present

## 2019-01-12 NOTE — Patient Instructions (Signed)
[name] , Thank you for taking time to come for your Medicare Wellness Visit. I appreciate your ongoing commitment to your health goals. Please review the following plan we discussed and let me know if I can assist you in the future.   Screening recommendations/referrals: Colonoscopy: up to date, completed 03/29/2015 Mammogram: up to date, completed 12/21/2018 Bone Density: up to date, completed 01/23/2015 Recommended yearly ophthalmology/optometry visit for glaucoma screening and checkup Recommended yearly dental visit for hygiene and checkup  Vaccinations: Influenza vaccine: up to date, completed 11/19/2018 Pneumococcal vaccine: Completed series Tdap vaccine: up to date, completed 04/28/2013 Shingles vaccine: will discuss with provider    Advanced directives: Please bring a copy of your POA (Power of [place]) and/or Living Will to your next appointment.   Conditions/risks identified: hyperlipidemia  Next appointment: 01/18/2019 @ 9:45 am    Preventive Care 65 Years and Older, Female Preventive care refers to lifestyle choices and visits with your health care provider that can promote health and wellness. What does preventive care include?  A yearly physical exam. This is also called an annual well check.  Dental exams once or twice a year.  Routine eye exams. Ask your health care provider how often you should have your eyes checked.  Personal lifestyle choices, including:  Daily care of your teeth and gums.  Regular physical activity.  Eating a healthy diet.  Avoiding tobacco and drug use.  Limiting alcohol use.  Practicing safe sex.  Taking low-dose aspirin every day.  Taking vitamin and mineral supplements as recommended by your health care provider. What happens during an annual well check? The services and screenings done by your health care provider during your annual well check will depend on your age, overall health, lifestyle risk factors, and family  history of disease. Counseling  Your health care provider may ask you questions about your:  Alcohol use.  Tobacco use.  Drug use.  Emotional well-being.  Home and relationship well-being.  Sexual activity.  Eating habits.  History of falls.  Memory and ability to understand (cognition).  Work and work Statistician.  Reproductive health. Screening  You may have the following tests or measurements:  Height, weight, and BMI.  Blood pressure.  Lipid and cholesterol levels. These may be checked every 5 years, or more frequently if you are over 20 years old.  Skin check.  Lung cancer screening. You may have this screening every year starting at age 56 if you have a 30-pack-year history of smoking and currently smoke or have quit within the past 15 years.  Fecal occult blood test (FOBT) of the stool. You may have this test every year starting at age 37.  Flexible sigmoidoscopy or colonoscopy. You may have a sigmoidoscopy every 5 years or a colonoscopy every 10 years starting at age 73.  Hepatitis C blood test.  Hepatitis B blood test.  Sexually transmitted disease (STD) testing.  Diabetes screening. This is done by checking your blood sugar (glucose) after you have not eaten for a while (fasting). You may have this done every 1-3 years.  Bone density scan. This is done to screen for osteoporosis. You may have this done starting at age 40.  Mammogram. This may be done every 1-2 years. Talk to your health care provider about how often you should have regular mammograms. Talk with your health care provider about your test results, treatment options, and if necessary, the need for more tests. Vaccines  Your health care provider may recommend certain vaccines,  such as:  Influenza vaccine. This is recommended every year.  Tetanus, diphtheria, and acellular pertussis (Tdap, Td) vaccine. You may need a Td booster every 10 years.  Zoster vaccine. You may need this after  age 47.  Pneumococcal 13-valent conjugate (PCV13) vaccine. One dose is recommended after age 42.  Pneumococcal polysaccharide (PPSV23) vaccine. One dose is recommended after age 57. Talk to your health care provider about which screenings and vaccines you need and how often you need them. This information is not intended to replace advice given to you by your health care provider. Make sure you discuss any questions you have with your health care provider. Document Released: 03/23/2015 Document Revised: 11/14/2015 Document Reviewed: 12/26/2014 Elsevier Interactive Patient Education  2017 [place] Prevention in the Home Falls can cause injuries. They can happen to people of all ages. There are many things you can do to make your home safe and to help prevent falls. What can I do on the outside of my home?  Regularly fix the edges of walkways and driveways and fix any cracks.  Remove anything that might make you trip as you walk through a door, such as a raised step or threshold.  Trim any bushes or trees on the path to your home.  Use bright outdoor lighting.  Clear any walking paths of anything that might make someone trip, such as rocks or tools.  Regularly check to see if handrails are loose or broken. Make sure that both sides of any steps have handrails.  Any raised decks and porches should have guardrails on the edges.  Have any leaves, snow, or ice cleared regularly.  Use sand or salt on walking paths during winter.  Clean up any spills in your garage right away. This includes oil or grease spills. What can I do in the bathroom?  Use night lights.  Install grab bars by the toilet and in the tub and shower. Do not use towel bars as grab bars.  Use non-skid mats or decals in the tub or shower.  If you need to sit down in the shower, use a plastic, non-slip stool.  Keep the floor dry. Clean up any water that spills on the floor as soon as it happens.   Remove soap buildup in the tub or shower regularly.  Attach bath mats securely with double-sided non-slip rug tape.  Do not have throw rugs and other things on the floor that can make you trip. What can I do in the bedroom?  Use night lights.  Make sure that you have a light by your bed that is easy to reach.  Do not use any sheets or blankets that are too big for your bed. They should not hang down onto the floor.  Have a firm chair that has side arms. You can use this for support while you get dressed.  Do not have throw rugs and other things on the floor that can make you trip. What can I do in the kitchen?  Clean up any spills right away.  Avoid walking on wet floors.  Keep items that you use a lot in easy-to-reach places.  If you need to reach something above you, use a strong step stool that has a grab bar.  Keep electrical cords out of the way.  Do not use floor polish or wax that makes floors slippery. If you must use wax, use non-skid floor wax.  Do not have throw rugs and other things on  the floor that can make you trip. What can I do with my stairs?  Do not leave any items on the stairs.  Make sure that there are handrails on both sides of the stairs and use them. Fix handrails that are broken or loose. Make sure that handrails are as long as the stairways.  Check any carpeting to make sure that it is firmly attached to the stairs. Fix any carpet that is loose or worn.  Avoid having throw rugs at the top or bottom of the stairs. If you do have throw rugs, attach them to the floor with carpet tape.  Make sure that you have a light switch at the top of the stairs and the bottom of the stairs. If you do not have them, ask someone to add them for you. What else can I do to help prevent falls?  Wear shoes that:  Do not have high heels.  Have rubber bottoms.  Are comfortable and fit you well.  Are closed at the toe. Do not wear sandals.  If you use a  stepladder:  Make sure that it is fully opened. Do not climb a closed stepladder.  Make sure that both sides of the stepladder are locked into place.  Ask someone to hold it for you, if possible.  Clearly mark and make sure that you can see:  Any grab bars or handrails.  First and last steps.  Where the edge of each step is.  Use tools that help you move around (mobility aids) if they are needed. These include:  Canes.  Walkers.  Scooters.  Crutches.  Turn on the lights when you go into a dark area. Replace any light bulbs as soon as they burn out.  Set up your furniture so you have a clear path. Avoid moving your furniture around.  If any of your floors are uneven, fix them.  If there are any pets around you, be aware of where they are.  Review your medicines with your doctor. Some medicines can make you feel dizzy. This can increase your chance of falling. Ask your doctor what other things that you can do to help prevent falls. This information is not intended to replace advice given to you by your health care provider. Make sure you discuss any questions you have with your health care provider. Document Released: 12/21/2008 Document Revised: 08/02/2015 Document Reviewed: 03/31/2014 Elsevier Interactive Patient Education  2017 [employer].

## 2019-01-12 NOTE — Progress Notes (Signed)
Subjective:   Ashlee Allen is a 68 y.o. female who presents for Medicare Annual (Subsequent) preventive examination.  Review of Systems: N/A   This visit is being conducted through telemedicine via telephone at the nurse health advisor's home address due to the COVID-19 pandemic. This patient has given me verbal consent via doximity to conduct this visit, patient states they are participating from their home address. Patient and myself are on the telephone call. There is no referral for this visit. Some vital signs may be absent or patient reported.    Patient identification: identified by name, DOB, and current address   Cardiac Risk Factors include: advanced age (>26men, >68 women);dyslipidemia;smoking/ tobacco exposure     Objective:     Vitals: There were no vitals taken for this visit.  There is no height or weight on file to calculate BMI.  Advanced Directives 01/12/2019 01/06/2018 01/02/2017 04/22/2016 04/21/2016 12/19/2015 05/17/2013  Does Patient Have a Medical Advance Directive? Yes Yes Yes Yes No Yes Patient does not have advance directive;Patient would not like information  Type of Scientist, forensic Power of [place];Living will [place];Living will [place];Living will Living will - [place];Living will -  Does patient want to make changes to medical advance directive? - - - No - Patient declined - No - Patient declined -  Copy of [place] in Chart? No - copy requested No - copy requested No - copy requested - - No - copy requested -    Tobacco Social History   Tobacco Use  Smoking Status Current Every Day Smoker  . Packs/day: 0.50  . Years: 40.00  . Pack years: 20.00  Smokeless Tobacco Never Used     Ready to quit: Not Answered Counseling given: Not Answered   Clinical Intake:  Pre-visit preparation completed: Yes  Pain : No/denies pain     Nutritional Risks: None  Diabetes: No  How often do you need to have someone help you when you read instructions, pamphlets, or other written materials from your doctor or pharmacy?: 1 - Never What is the last grade level you completed in school?: Bachelors  Interpreter Needed?: No  Information entered by :: CJohnson, LPN  Past Medical History:  Diagnosis Date  . Cardiac murmur    echo prev done  . Depression   . Discoid lupus    Dr. Kalman Shan (prev seen at [facility]) and Dr. Ubaldo Glassing  . Fibroid   . GERD (gastroesophageal reflux disease)    controllled with weight loss   . Hyperlipidemia   . Pulmonary nodule    Past Surgical History:  Procedure Laterality Date  . ABDOMINAL HYSTERECTOMY  1997   TA[facility]  [place]  . BREAST SURGERY  2001   Duct excised-rt  . CATARACT EXTRACTION, BILATERAL    . CHOLECYSTECTOMY  1991  . COLONOSCOPY    . HAND SURGERY  2001   ctr-both  . INNER EAR SURGERY     left- mastoidectomy  . KNEE ARTHROSCOPY Left 05/20/2013   Procedure: LEFT KNEE ARTHROSCOPY WITH DEBRIDEMENT/SHAVING (CHONDROPLASTY) AND LATERAL AND MEDIAL MENISECTOMY, EXCISION OF PLICA;  Surgeon: Yvette Rack., MD;  Location: [facility];  Service: Orthopedics;  Laterality: Left;  . LAPAROSCOPIC APPENDECTOMY N/A 04/22/2016   Procedure: APPENDECTOMY LAPAROSCOPIC;  Surgeon: Alphonsa Overall, MD;  Location: [facility];  Service: General;  Laterality: N/A;   Family History  Problem Relation Age of Onset  . Alcohol abuse Mother   .  Hyperlipidemia Mother   . Hypertension Mother   . Diabetes Mother   . Cancer Mother        LUNG- SMOKER   . Alcohol abuse Father   . Cancer Father        lung  . Seizures Brother   . Colon cancer Neg Hx   . Breast cancer Neg Hx    Social History   Socioeconomic History  . Marital status: Married    Spouse name: Not on file  . Number of children: 2  . Years of education: Not on file  . Highest education level: Not on file  Occupational History  . 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 Financial resource strain: Not hard at all  . Food insecurity    Worry: Never true    Inability: Never true  . Transportation needs    Medical: No    Non-medical: No  Tobacco Use  . Smoking status: Current Every Day Smoker    Packs/day: 0.50    Years: 40.00    Pack years: 20.00  . Smokeless tobacco: Never Used  Substance and Sexual Activity  . Alcohol use: No    Alcohol/week: 0.0 standard drinks  . Drug use: No  . Sexual activity: Yes    Birth control/protection: Surgical    Comment: 1 partner - less than 5   Lifestyle  . Physical activity    Days per week: 0 days    Minutes per session: 0 min  . Stress: Not at all  Relationships  . Social Herbalist on phone: Not on file    Gets together: Not on file    Attends religious service: Not on file    Active member of club or organization: Not on file    Attends meetings of clubs or organizations: Not on file    Relationship status: Not on file  Other Topics Concern  . Not on file  Social History Narrative   Walking for exercise   Married 1978   Retired from teaching/coaching    Outpatient Encounter Medications as of 01/12/2019  Medication Sig  . calcium carbonate (TUMS) 500 MG chewable tablet Chew 1 tablet by mouth as needed for indigestion or heartburn.  . hydroxychloroquine (PLAQUENIL) 200 MG tablet 1-2 tabs per day (wintertime 1 tab a day, summertime 2 tabs a day)  . amoxicillin-clavulanate (AUGMENTIN) 875-125 MG tablet Take 1 tablet by mouth 2 (two) times daily. (Patient not taking: Reported on 01/12/2019)  . atorvastatin (LIPITOR) 10 MG tablet Take 0.5-1 tablets (5-10 mg total) by mouth 2 (two) times a week. (Patient not taking: Reported on 01/12/2019)  . [DISCONTINUED] Omeprazole (PRILOSEC PO) Take by mouth as needed.     No facility-administered encounter medications on file as of 01/12/2019.     Activities of Daily Living In your present state of health, do you  have any difficulty performing the following activities: 01/12/2019  Hearing? Y  Comment hearing aids  Vision? N  Difficulty concentrating or making decisions? N  Walking or climbing stairs? N  Dressing or bathing? N  Doing errands, shopping? N  Preparing Food and eating ? N  Using the Toilet? N  In the past six months, have you accidently leaked urine? Y  Comment wears a pantyliner  Do you have problems with loss of bowel control? N  Managing your Medications? N  Managing your Finances? N  Housekeeping or managing your Housekeeping?  N  Some recent data might be hidden    Patient Care Team: Tonia Ghent, MD as PCP - General [place], Heather, Rush as Referring Physician (Optometry)    Assessment:   This is a routine wellness examination for Kare.  Exercise Activities and Dietary recommendations Current Exercise Habits: Home exercise routine, Type of exercise: walking, Time (Minutes): 30, Frequency (Times/Week): 3, Weekly Exercise (Minutes/Week): 90, Intensity: Mild, Exercise limited by: None identified  Goals    . Increase physical activity     Starting 01/06/2018, I will continue to walking for 20-30 min daily.     . Patient Stated     01/12/2019, I will maintain and continue medications as prescribed.        Fall Risk Fall Risk  01/12/2019 01/06/2018 01/02/2017 12/19/2015 12/11/2014  Falls in the past year? 1 No No No No  Comment tripped on purse - - - -  Number falls in past yr: 0 - - - -  Injury with Fall? 0 - - - -  Follow up Falls evaluation completed;Falls prevention discussed - - - -   Is the patient's home free of loose throw rugs in walkways, pet beds, electrical cords, etc?   yes      Grab bars in the bathroom? yes      Handrails on the stairs?   yes      Adequate lighting?   yes  Timed Get Up and Go performed: N/A  Depression Screen PHQ 2/9 Scores 01/12/2019 01/06/2018 01/02/2017 12/19/2015  PHQ - 2 Score 0 0 0 0  PHQ- 9 Score 0 0 0 -     Cognitive  Function MMSE - Mini Mental State Exam 01/12/2019 01/06/2018 01/02/2017 12/19/2015  Orientation to time 5 5 5 5   Orientation to Place 5 5 5 5   Registration 3 3 3 3   Attention/ Calculation 5 0 0 0  Recall 3 3 3 3   Language- name 2 objects - 0 0 0  Language- repeat 1 1 1 1   Language- follow 3 step command - 3 3 3   Language- read & follow direction - 0 0 0  Write a sentence - 0 0 0  Copy design - 0 0 0  Total score - 20 20 20   Mini Cog  Mini-Cog screen was completed. Maximum score is 22. A value of 0 denotes this part of the MMSE was not completed or the patient failed this part of the Mini-Cog screening.       Immunization History  Administered Date(s) Administered  . Fluad Quad(high Dose 65+) 11/19/2018  . Influenza Split 12/25/2010, 12/17/2011, 12/02/2012  . Influenza Whole 01/08/2010, 12/17/2011  . Influenza, High Dose Seasonal PF 01/02/2017, 12/08/2017  . Influenza,inj,Quad PF,6+ Mos 12/11/2014, 12/19/2015  . Influenza-Unspecified 12/07/2013, 12/08/2017  . Pneumococcal Conjugate-13 12/11/2014  . Pneumococcal Polysaccharide-23 12/19/2015  . Td 08/24/2007  . Tdap 04/28/2013    Qualifies for Shingles Vaccine? Yes  Screening Tests Health Maintenance  Topic Date Due  . COLONOSCOPY  03/28/2020  . MAMMOGRAM  12/20/2020  . TETANUS/TDAP  04/29/2023  . INFLUENZA VACCINE  Completed  . DEXA SCAN  Completed  . Hepatitis C Screening  Completed  . PNA vac Low Risk Adult  Completed    Cancer Screenings: Lung: Low Dose CT Chest recommended if Age 54-80 years, 30 pack-year currently smoking OR have quit w/in 15years. Patient does qualify and was completed 04/26/2018. Breast:  Up to date on Mammogram? Yes, completed 12/21/2018   Up to date  of Bone Density/Dexa? Yes, completed 01/23/2015 Colorectal: completed 03/29/2015  Additional Screenings:  Hepatitis C Screening: 10/28/2012     Plan:    Patient wants to maintain and continue medications as prescribed.    I have personally  reviewed and noted the following in the patient's chart:   . Medical and social history . Use of alcohol, tobacco or illicit drugs  . Current medications and supplements . Functional ability and status . Nutritional status . Physical activity . Advanced directives . List of other physicians . Hospitalizations, surgeries, and ER visits in previous 12 months . Vitals . Screenings to include cognitive, depression, and falls . Referrals and appointments  In addition, I have reviewed and discussed with patient certain preventive protocols, quality metrics, and best practice recommendations. A written personalized care plan for preventive services as well as general preventive health recommendations were provided to patient.     Andrez Grime, LPN  [phone]

## 2019-01-12 NOTE — Progress Notes (Signed)
PCP notes:  Health Maintenance: Will discuss Shingrix with provider   Abnormal Screenings: none   Patient concerns: none   Nurse concerns: none   Next PCP appt.: 01/18/2019 @ 9:45 am

## 2019-01-18 ENCOUNTER — Other Ambulatory Visit: Payer: Self-pay

## 2019-01-18 ENCOUNTER — Encounter: Payer: Medicare Other | Admitting: Family Medicine

## 2019-01-18 ENCOUNTER — Ambulatory Visit: Payer: Medicare Other

## 2019-01-18 ENCOUNTER — Encounter: Payer: Self-pay | Admitting: Family Medicine

## 2019-01-18 ENCOUNTER — Ambulatory Visit (INDEPENDENT_AMBULATORY_CARE_PROVIDER_SITE_OTHER): Payer: Medicare Other | Admitting: Family Medicine

## 2019-01-18 VITALS — BP 102/58 | HR 89 | Temp 97.8°F | Ht 65.0 in | Wt 159.3 lb

## 2019-01-18 DIAGNOSIS — L930 Discoid lupus erythematosus: Secondary | ICD-10-CM

## 2019-01-18 DIAGNOSIS — R300 Dysuria: Secondary | ICD-10-CM | POA: Diagnosis not present

## 2019-01-18 DIAGNOSIS — Z0000 Encounter for general adult medical examination without abnormal findings: Secondary | ICD-10-CM

## 2019-01-18 DIAGNOSIS — Z7189 Other specified counseling: Secondary | ICD-10-CM

## 2019-01-18 DIAGNOSIS — E785 Hyperlipidemia, unspecified: Secondary | ICD-10-CM

## 2019-01-18 DIAGNOSIS — F17200 Nicotine dependence, unspecified, uncomplicated: Secondary | ICD-10-CM | POA: Diagnosis not present

## 2019-01-18 DIAGNOSIS — N3090 Cystitis, unspecified without hematuria: Secondary | ICD-10-CM

## 2019-01-18 LAB — POC URINALSYSI DIPSTICK (AUTOMATED)
Bilirubin, UA: NEGATIVE
Blood, UA: NEGATIVE
Glucose, UA: NEGATIVE
Ketones, UA: NEGATIVE
Nitrite, UA: NEGATIVE
Protein, UA: NEGATIVE
Spec Grav, UA: 1.015 (ref 1.010–1.025)
Urobilinogen, UA: 0.2 E.U./dL
pH, UA: 5.5 (ref 5.0–8.0)

## 2019-01-18 MED ORDER — SULFAMETHOXAZOLE-TRIMETHOPRIM 800-160 MG PO TABS: 1.0000 | ORAL_TABLET | Freq: Two times a day (BID) | ORAL | 0 refills | Status: AC

## 2019-01-18 NOTE — Progress Notes (Signed)
Covid cautions d/w pt.    Smoking d/w pt. precontemplative.  Discussed.  She is on plaquenil at baseline per Dr. Ubaldo Glassing.   No new skin lesions, managed by derm.  I will defer.  She agrees.  She had neck pain and is better with prev PT and steroid course.   Dysuria.  Noted a new odor to urine. This AM with mild discomfort with urination.  No FCNAVD.    TG elevation d/w pt.  D/w pt about diet and exercise.  Sugar is still wnl.    Tetanus 2015 Flu 2020 PNA up to date.  Shingles d/w pt.   Colonoscopy 2017 Breast cancer screening 2020 Pap declined.   DXA 2016, wnl. d/w pt.  Defer in 2020 Advance directive- husband designated if patient were incapacitated.  PMH and SH reviewed ROS: Per HPI unless specifically indicated in ROS section   Meds, vitals, and allergies reviewed.   GEN: nad, alert and oriented HEENT: ncat NECK: supple w/o LA CV: rrr PULM: ctab, no inc wob ABD: soft, +bs EXT: no edema SKIN: no acute rash

## 2019-01-18 NOTE — Patient Instructions (Addendum)
Check with your insurance to see if they will cover the shingrix shot. We'll contact you about your urine test.  Don't change your meds for now.  Update me as needed.  Take care.  Glad to see you.

## 2019-01-19 DIAGNOSIS — Z0000 Encounter for general adult medical examination without abnormal findings: Secondary | ICD-10-CM | POA: Insufficient documentation

## 2019-01-19 NOTE — Assessment & Plan Note (Signed)
Tetanus 2015 Flu 2020 PNA up to date.  Shingles d/w pt.   Colonoscopy 2017 Breast cancer screening 2020 Pap declined.   DXA 2016, wnl. d/w pt.  Defer in 2020 Advance directive- husband designated if patient were incapacitated.

## 2019-01-19 NOTE — Assessment & Plan Note (Signed)
Smoking d/w pt. precontemplative.  Discussed.

## 2019-01-19 NOTE — Assessment & Plan Note (Signed)
Advance directive- husband designated if patient were incapacitated.  

## 2019-01-19 NOTE — Assessment & Plan Note (Signed)
TG elevation d/w pt.  D/w pt about diet and exercise.  Sugar is still wnl.

## 2019-01-19 NOTE — Assessment & Plan Note (Addendum)
Presumed cystitis.  Start Septra.  Urine culture pending.  Okay for outpatient follow-up.  >25 minutes spent in face to face time with patient, >50% spent in counselling or coordination of care.

## 2019-01-19 NOTE — Assessment & Plan Note (Signed)
She is on plaquenil at baseline per Dr. Ubaldo Glassing.   No new skin lesions, managed by derm.  I will defer.  She agrees.

## 2019-01-20 LAB — URINE CULTURE
MICRO NUMBER:: 1084357
SPECIMEN QUALITY:: ADEQUATE

## 2019-03-25 ENCOUNTER — Encounter: Payer: Self-pay | Admitting: Family Medicine

## 2019-03-29 NOTE — Telephone Encounter (Signed)
Patient left a voicemail stating that she is scheduled for the covid vaccine tomorrow and needs this question answered by tomorrow morning.

## 2019-04-19 ENCOUNTER — Ambulatory Visit: Payer: Medicare Other

## 2019-05-11 DIAGNOSIS — L2089 Other atopic dermatitis: Secondary | ICD-10-CM | POA: Diagnosis not present

## 2019-05-11 DIAGNOSIS — L930 Discoid lupus erythematosus: Secondary | ICD-10-CM | POA: Diagnosis not present

## 2019-05-11 DIAGNOSIS — Z79899 Other long term (current) drug therapy: Secondary | ICD-10-CM | POA: Diagnosis not present

## 2019-06-17 ENCOUNTER — Encounter: Payer: Self-pay | Admitting: Family Medicine

## 2019-06-17 ENCOUNTER — Ambulatory Visit (INDEPENDENT_AMBULATORY_CARE_PROVIDER_SITE_OTHER): Payer: Medicare PPO | Admitting: Family Medicine

## 2019-06-17 ENCOUNTER — Other Ambulatory Visit: Payer: Self-pay

## 2019-06-17 DIAGNOSIS — M545 Low back pain, unspecified: Secondary | ICD-10-CM

## 2019-06-17 DIAGNOSIS — J988 Other specified respiratory disorders: Secondary | ICD-10-CM | POA: Diagnosis not present

## 2019-06-17 MED ORDER — PREDNISONE 20 MG PO TABS: ORAL_TABLET | ORAL | 0 refills | Status: DC

## 2019-06-17 NOTE — Progress Notes (Signed)
Interactive audio and video telecommunications were attempted between this provider and patient, however failed, due to patient having technical difficulties OR patient did not have access to video capability.  We continued and completed visit with audio only.   Virtual Visit via Telephone Note  I connected with patient on 06/17/19  at 8:55 AM  by telephone and verified that I am speaking with the correct person using two identifiers.  Location of patient: home.   Location of MD: [facility] Name of referring provider (if blank then none associated): Names per persons and role in encounter:  MD: Earlyne Iba, Patient: name listed above.    I discussed the limitations, risks, security and privacy concerns of performing an evaluation and management service by telephone and the availability of in person appointments. I also discussed with the patient that there may be a patient responsible charge related to this service. The patient expressed understanding and agreed to proceed.  CC: hip pain. Congestion.   History of Present Illness: She had covid vaccine.   Gluteal pain.  L sided.  Pain going on steps, prolonged sitting.  Some radiation in the L upper leg, posterior.  advil helps.  Less pain walking.  Going on for about a few months.  Gradually worse over time, then levelled off.  No R sided pain.  No trauma, no weakness.  No numbness.  No bowel or bladder sx.    Congestion noted.  No fevers.  She had covid vaccine prev.  Clearing her throat, sometimes a cough, for a few weeks.  Change of seasons, worse with pollen exposure but predates pollen changes overall.  Some ST.  Clear sputum.  Some occ wheeze.  She doesn't feel unwell.  Smoking d/w pt.  Encouraged cessation.     Observations/Objective: nad Speech normal When she attempted straight leg raise at home it was negative bilaterally.  Assessment and Plan: Left-sided gluteal pain with concern for sciatic irritation though straight  leg raise was negative at time of exam.  Reasonable to try prednisone with routine cautions. We talked about lower back stretching.  She will update me if not better.  Congestion.  No emergent symptoms.  Okay for outpatient follow-up.  We talked about the fact that concurrent prednisone treatment for her back may also help with this and she will update me if not better.  She agrees  Follow Up Instructions: see above.    I discussed the assessment and treatment plan with the patient. The patient was provided an opportunity to ask questions and all were answered. The patient agreed with the plan and demonstrated an understanding of the instructions.   The patient was advised to call back or seek an in-person evaluation if the symptoms worsen or if the condition fails to improve as anticipated.  I provided 16 minutes of non-face-to-face time during this encounter.  Elsie Stain, MD

## 2019-06-19 DIAGNOSIS — J988 Other specified respiratory disorders: Secondary | ICD-10-CM | POA: Insufficient documentation

## 2019-06-19 DIAGNOSIS — M545 Low back pain, unspecified: Secondary | ICD-10-CM | POA: Insufficient documentation

## 2019-06-19 NOTE — Assessment & Plan Note (Signed)
No emergent symptoms.  Okay for outpatient follow-up.  We talked about the fact that concurrent prednisone treatment for her back may also help with this and she will update me if not better.  She agrees

## 2019-06-19 NOTE — Assessment & Plan Note (Signed)
Left-sided gluteal pain with concern for sciatic irritation though straight leg raise was negative at time of exam.  Reasonable to try prednisone with routine cautions. We talked about lower back stretching.  She will update me if not better.

## 2019-07-08 DIAGNOSIS — N390 Urinary tract infection, site not specified: Secondary | ICD-10-CM | POA: Diagnosis not present

## 2019-10-02 ENCOUNTER — Ambulatory Visit
Admission: EM | Admit: 2019-10-02 | Discharge: 2019-10-02 | Disposition: A | Discharge status: Home / Self Care | Payer: Medicare PPO | Attending: Physician Assistant | Admitting: Physician Assistant

## 2019-10-02 ENCOUNTER — Other Ambulatory Visit: Payer: Self-pay

## 2019-10-02 ENCOUNTER — Ambulatory Visit: Payer: Self-pay

## 2019-10-02 DIAGNOSIS — J3489 Other specified disorders of nose and nasal sinuses: Secondary | ICD-10-CM | POA: Diagnosis not present

## 2019-10-02 DIAGNOSIS — R059 Cough, unspecified: Secondary | ICD-10-CM

## 2019-10-02 DIAGNOSIS — R0981 Nasal congestion: Secondary | ICD-10-CM | POA: Insufficient documentation

## 2019-10-02 DIAGNOSIS — R05 Cough: Secondary | ICD-10-CM | POA: Diagnosis not present

## 2019-10-02 DIAGNOSIS — N3090 Cystitis, unspecified without hematuria: Secondary | ICD-10-CM | POA: Insufficient documentation

## 2019-10-02 LAB — POCT URINALYSIS DIP (MANUAL ENTRY)
Bilirubin, UA: NEGATIVE
Blood, UA: NEGATIVE
Glucose, UA: NEGATIVE mg/dL
Ketones, POC UA: NEGATIVE mg/dL
Nitrite, UA: NEGATIVE
Protein Ur, POC: NEGATIVE mg/dL
Spec Grav, UA: 1.01 (ref 1.010–1.025)
Urobilinogen, UA: 0.2 E.U./dL
pH, UA: 6.5 (ref 5.0–8.0)

## 2019-10-02 MED ORDER — BENZONATATE 200 MG PO CAPS: 200.0000 mg | ORAL_CAPSULE | Freq: Three times a day (TID) | ORAL | 0 refills | Status: DC

## 2019-10-02 MED ORDER — CEPHALEXIN 500 MG PO CAPS: 500.0000 mg | ORAL_CAPSULE | Freq: Two times a day (BID) | ORAL | 0 refills | Status: DC

## 2019-10-02 MED ORDER — IPRATROPIUM BROMIDE 0.06 % NA SOLN: 2.0000 | Freq: Four times a day (QID) | NASAL | 0 refills | Status: DC

## 2019-10-02 MED ORDER — FLUTICASONE PROPIONATE 50 MCG/ACT NA SUSP: 2.0000 | Freq: Every day | NASAL | 0 refills | Status: DC

## 2019-10-02 MED ORDER — ALBUTEROL SULFATE HFA 108 (90 BASE) MCG/ACT IN AERS: 1.0000 | INHALATION_SPRAY | Freq: Four times a day (QID) | RESPIRATORY_TRACT | 0 refills | Status: DC | PRN

## 2019-10-02 NOTE — ED Triage Notes (Signed)
Pt c/o urinary frequency and strong odor of her urine.

## 2019-10-02 NOTE — Discharge Instructions (Signed)
URI symptoms COVID PCR testing ordered. I would like you to quarantine until testing results. Tessalon for cough. Albuterol as needed for cough/shortness of breath/wheezing. Start flonase, atrovent nasal spray for nasal congestion/drainage. You can use over the counter nasal saline rinse such as neti pot for nasal congestion. Keep hydrated, your urine should be clear to pale yellow in color. Tylenol/motrin for fever and pain. Monitor for any worsening of symptoms, chest pain, shortness of breath, wheezing, swelling of the throat, go to the emergency department for further evaluation needed.    UTI Your urine was positive for an urinary tract infection. Start keflex as directed. Keep hydrated, urine should be clear to pale yellow in color. Monitor for any worsening of symptoms, fever, worsening abdominal pain, nausea/vomiting, flank pain, follow up for reevaluation.

## 2019-10-02 NOTE — ED Provider Notes (Signed)
[facility]    CSN: [id number] Arrival date & time: 10/02/19  1040      History   Chief Complaint Chief Complaint  Patient presents with  . Urinary Frequency    HPI Ashlee Allen is a 69 y.o. female.   69 year old female comes in for multiple complaints  1. 3 day of URI symptoms. Cough, wheezing, rhinorrhea, nasal congestion, post nasal drip. Denies fever, chills, body aches. Intermittent shortness of breath with coughing. Denies loss of taste/smell. Current every day smoker. Fully COVID vaccinated Tomma Rakers, 04/2019)  2. 1 week history of urinary symptoms. Urinary frequency, urine odor. Denies dysuria, hematuria. Denies abdominal pain, nausea, vomiting. Denies fever, chills, flank/back pain. Denies vaginal discharge, itching, spotting.       Past Medical History:  Diagnosis Date  . Cardiac murmur    echo prev done  . Depression   . Discoid lupus    Dr. Kalman Shan (prev seen at [facility]) and Dr. Ubaldo Glassing  . Fibroid   . GERD (gastroesophageal reflux disease)    controllled with weight loss   . Hyperlipidemia   . Pulmonary nodule     Patient Active Problem List   Diagnosis Date Noted  . Lower back pain 06/19/2019  . Congestion of upper airway 06/19/2019  . Medicare annual wellness visit, subsequent 01/19/2019  . Left arm pain 04/11/2018  . Neck pain 02/22/2018  . Rash 05/27/2017  . Health care maintenance 01/14/2017  . Dysuria 01/14/2017  . Pulmonary nodule 04/23/2016  . HLD (hyperlipidemia) 12/25/2015  . Advance care planning 12/13/2014  . Smoker 10/28/2012  . Urinary incontinence 10/28/2012  . History of cervical dysplasia 10/28/2012  . Menopause 10/14/2010  . 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  . GERD 01/15/2010  . LUPUS ERYTHEMATOSUS, DISCOID 01/15/2010    Past Surgical History:  Procedure Laterality Date  . ABDOMINAL HYSTERECTOMY  1997   TAH  [place]  . BREAST SURGERY  2001   Duct excised-rt  . CATARACT EXTRACTION, BILATERAL    .  CHOLECYSTECTOMY  1991  . COLONOSCOPY    . HAND SURGERY  2001   ctr-both  . INNER EAR SURGERY     left- mastoidectomy  . KNEE ARTHROSCOPY Left 05/20/2013   Procedure: LEFT KNEE ARTHROSCOPY WITH DEBRIDEMENT/SHAVING (CHONDROPLASTY) AND LATERAL AND MEDIAL MENISECTOMY, EXCISION OF PLICA;  Surgeon: Yvette Rack., MD;  Location: [place];  Service: Orthopedics;  Laterality: Left;  . LAPAROSCOPIC APPENDECTOMY N/A 04/22/2016   Procedure: APPENDECTOMY LAPAROSCOPIC;  Surgeon: Alphonsa Overall, MD;  Location: [facility];  Service: General;  Laterality: N/A;    OB History    Gravida  3   Para  2   Term  2   Preterm      AB  1   Living  2     SAB      TAB      Ectopic      Multiple      Live Births               Home Medications    Prior to Admission medications   Medication Sig Start Date End Date Taking? Authorizing Provider  albuterol (VENTOLIN HFA) 108 (90 Base) MCG/ACT inhaler Inhale 1-2 puffs into the lungs every 6 (six) hours as needed for wheezing or shortness of breath. 10/02/19   Tasia Catchings, Dastan Krider V, PA-C  benzonatate (TESSALON) 200 MG capsule Take 1 capsule (200 mg total) by mouth every 8 (eight) hours. 10/02/19   Tasia Catchings,  Iria Jamerson V, PA-C  calcium carbonate (TUMS) 500 MG chewable tablet Chew 1 tablet by mouth as needed for indigestion or heartburn.    [provider]  calcium citrate (CALCITRATE - DOSED IN MG ELEMENTAL CALCIUM) 950 MG tablet Take 200 mg of elemental calcium by mouth daily.    [provider]  cephALEXin (KEFLEX) 500 MG capsule Take 1 capsule (500 mg total) by mouth 2 (two) times daily. 10/02/19   Tasia Catchings, Twisha Vanpelt V, PA-C  fluticasone (FLONASE) 50 MCG/ACT nasal spray Place 2 sprays into both nostrils daily. 10/02/19   Tasia Catchings, Khalis Hittle V, PA-C  hydroxychloroquine (PLAQUENIL) 200 MG tablet 1-2 tabs per day (wintertime 1 tab a day, summertime 2 tabs a day) 01/12/17   Tonia Ghent, MD  ipratropium (ATROVENT) 0.06 % nasal spray Place 2 sprays into both nostrils 4  (four) times daily. 10/02/19   Tasia Catchings, Jalon Squier V, PA-C  Omeprazole (PRILOSEC PO) Take by mouth as needed.    06/03/11  [provider]    Family History Family History  Problem Relation Age of Onset  . Alcohol abuse Mother   . Hyperlipidemia Mother   . Hypertension Mother   . Diabetes Mother   . Cancer Mother        LUNG- SMOKER   . Alcohol abuse Father   . Cancer Father        lung  . Seizures Brother   . Colon cancer Neg Hx   . Breast cancer Neg Hx     Social History Social History   Tobacco Use  . Smoking status: Current Every Day Smoker    Packs/day: 0.50    Years: 40.00    Pack years: 20.00  . Smokeless tobacco: Never Used  Vaping Use  . Vaping Use: Never used  Substance Use Topics  . Alcohol use: No    Alcohol/week: 0.0 standard drinks  . Drug use: No     Allergies   Latex, Lipitor [atorvastatin calcium], and Varenicline tartrate   Review of Systems Review of Systems  Reason unable to perform ROS: See HPI as above.     Physical Exam Triage Vital Signs ED Triage Vitals  Enc Vitals Group     BP 10/02/19 1133 128/68     Pulse Rate 10/02/19 1133 95     Resp 10/02/19 1133 16     Temp 10/02/19 1133 98.4 F (36.9 C)     Temp Source 10/02/19 1133 Oral     SpO2 10/02/19 1133 96 %     Weight --      Height --      Head Circumference --      Peak Flow --      Pain Score 10/02/19 1149 0     Pain Loc --      Pain Edu? --      Excl. in [place]? --    No data found.  Updated Vital Signs BP 128/68 (BP Location: Right Arm)   Pulse 95   Temp 98.4 F (36.9 C) (Oral)   Resp 16   SpO2 96%   Physical Exam Constitutional:      General: She is not in acute distress.    Appearance: Normal appearance. She is well-developed. She is not ill-appearing, toxic-appearing or diaphoretic.  HENT:     Head: Normocephalic and atraumatic.     Right Ear: Tympanic membrane, ear canal and external ear normal. Tympanic membrane is not erythematous or bulging.     Left Ear:  Tympanic  membrane, ear canal and external ear normal. Tympanic membrane is not erythematous or bulging.     Nose:     Right Sinus: Maxillary sinus tenderness present. No frontal sinus tenderness.     Left Sinus: Maxillary sinus tenderness present. No frontal sinus tenderness.     Mouth/Throat:     Mouth: Mucous membranes are moist.     Pharynx: Oropharynx is clear. Uvula midline.  Eyes:     Conjunctiva/sclera: Conjunctivae normal.     Pupils: Pupils are equal, round, and reactive to light.  Cardiovascular:     Rate and Rhythm: Normal rate and regular rhythm.  Pulmonary:     Effort: Pulmonary effort is normal. No accessory muscle usage, prolonged expiration, respiratory distress or retractions.     Breath sounds: No decreased air movement or transmitted upper airway sounds. No decreased breath sounds.     Comments: LCTAB Abdominal:     General: Bowel sounds are normal.     Palpations: Abdomen is soft.     Tenderness: There is no abdominal tenderness. There is no right CVA tenderness, left CVA tenderness, guarding or rebound.  Musculoskeletal:     Cervical back: Normal range of motion and neck supple.  Skin:    General: Skin is warm and dry.  Neurological:     Mental Status: She is alert and oriented to person, place, and time.  Psychiatric:        Behavior: Behavior normal.        Judgment: Judgment normal.      UC Treatments / Results  Labs (all labs ordered are listed, but only abnormal results are displayed) Labs Reviewed  POCT URINALYSIS DIP (MANUAL ENTRY) - Abnormal; Notable for the following components:      Result Value   Leukocytes, UA Trace (*)    All other components within normal limits  URINE CULTURE  NOVEL CORONAVIRUS, NAA    EKG   Radiology No results found.  Procedures Procedures (including critical care time)  Medications Ordered in UC Medications - No data to display  Initial Impression / Assessment and Plan / UC Course  I have reviewed the  triage vital signs and the nursing notes.  Pertinent labs & imaging results that were available during my care of the patient were reviewed by me and considered in my medical decision making (see chart for details).    1. URI symptoms COVID PCR test ordered. Patient to quarantine until testing results return. No alarming signs on exam. LCTAB. Patient with history of good relief for cough/wheezing using prednisone. However, given current cystitis, will defer for now. Symptomatic treatment discussed. Return precautions given.  2. Cystitis Urine dipstick positive for UTI. Start antibiotics as directed. Push fluids. Return precautions given.   Final Clinical Impressions(s) / UC Diagnoses   Final diagnoses:  Cough  Nasal congestion  Sinus pressure  Cystitis    ED Prescriptions    Medication Sig Dispense Auth. Provider   cephALEXin (KEFLEX) 500 MG capsule Take 1 capsule (500 mg total) by mouth 2 (two) times daily. 10 capsule Toyia Jelinek V, PA-C   albuterol (VENTOLIN HFA) 108 (90 Base) MCG/ACT inhaler Inhale 1-2 puffs into the lungs every 6 (six) hours as needed for wheezing or shortness of breath. 8 g Shanee Batch V, PA-C   fluticasone (FLONASE) 50 MCG/ACT nasal spray Place 2 sprays into both nostrils daily. 1 g Melinda Pottinger V, PA-C   ipratropium (ATROVENT) 0.06 % nasal spray Place 2 sprays into both nostrils 4 (  four) times daily. 15 mL Shahidah Nesbitt V, PA-C   benzonatate (TESSALON) 200 MG capsule Take 1 capsule (200 mg total) by mouth every 8 (eight) hours. 21 capsule Ok Edwards, PA-C     PDMP not reviewed this encounter.   Ok Edwards, PA-C 10/02/19 1227

## 2019-10-03 LAB — NOVEL CORONAVIRUS, NAA: SARS-CoV-2, NAA: NOT DETECTED

## 2019-10-03 LAB — SARS-COV-2, NAA 2 DAY TAT

## 2019-10-05 LAB — URINE CULTURE: Culture: >=100000 — AB

## 2019-10-31 DIAGNOSIS — Z79899 Other long term (current) drug therapy: Secondary | ICD-10-CM | POA: Diagnosis not present

## 2019-11-05 ENCOUNTER — Ambulatory Visit: Payer: Medicare PPO

## 2019-11-15 ENCOUNTER — Encounter: Payer: Self-pay | Admitting: Family Medicine

## 2019-11-22 DIAGNOSIS — L930 Discoid lupus erythematosus: Secondary | ICD-10-CM | POA: Diagnosis not present

## 2019-11-22 DIAGNOSIS — Z79899 Other long term (current) drug therapy: Secondary | ICD-10-CM | POA: Diagnosis not present

## 2019-11-22 DIAGNOSIS — L821 Other seborrheic keratosis: Secondary | ICD-10-CM | POA: Diagnosis not present

## 2019-11-25 ENCOUNTER — Ambulatory Visit: Payer: Self-pay

## 2019-12-05 ENCOUNTER — Other Ambulatory Visit: Payer: Self-pay

## 2019-12-05 ENCOUNTER — Telehealth (INDEPENDENT_AMBULATORY_CARE_PROVIDER_SITE_OTHER): Payer: Medicare PPO | Admitting: Family Medicine

## 2019-12-05 ENCOUNTER — Encounter: Payer: Self-pay | Admitting: Family Medicine

## 2019-12-05 DIAGNOSIS — J069 Acute upper respiratory infection, unspecified: Secondary | ICD-10-CM | POA: Diagnosis not present

## 2019-12-05 MED ORDER — PREDNISONE 20 MG PO TABS: ORAL_TABLET | ORAL | 0 refills | Status: DC

## 2019-12-05 MED ORDER — AMOXICILLIN-POT CLAVULANATE 875-125 MG PO TABS: 1.0000 | ORAL_TABLET | Freq: Two times a day (BID) | ORAL | 0 refills | Status: DC

## 2019-12-05 NOTE — Progress Notes (Signed)
Virtual visit completed through WebEx or similar program Patient location: home  Provider location: [place] at [facility], office  Participants: Patient and me (unless stated otherwise below)  Pandemic considerations d/w pt.   Limitations and rationale for visit method d/w patient.  Patient agreed to proceed.   CC: URI sx.    HPI:  Sx going on for about 1 month.  Started after looking after her grandchild who had RSV about 1 months ago.  More cough and frontal HA/maxillary soreness in the last few days.  Smoking. Some wheeze.  Hoarse voice.  More sx at night.  No fevers.  Temp 97.6 today.  BP 124/72.  Some clear sputum.  No vomiting.  No diarrhea.  Normal taste and smell.    She doesn't have any known covid exposure.  She had booster dose.    She can get a covid test done.   Meds and allergies reviewed.   ROS: Per HPI unless specifically indicated in ROS section   NAD Speech wnl  A/P: URI sx.  She can check on getting a covid test done.  Information sent to patient.   We talked about preemptive treatment for sinusitis given her hx. start Augmentin. Use SABA prn but hold prednisone, start prednisone if cont wheeze.  Not SOB.  Okay for outpatient f/u.  Rest and fluids.   She'll update me as needed.

## 2019-12-07 NOTE — Assessment & Plan Note (Addendum)
URI sx.  She can check on getting a covid test done.  Information sent to patient.   We talked about preemptive treatment for sinusitis given her hx. start Augmentin. Use SABA prn but hold prednisone, start prednisone if cont wheeze.  Not SOB.  Okay for outpatient f/u.  Rest and fluids.   She'll update me as needed.

## 2019-12-08 ENCOUNTER — Encounter: Payer: Self-pay | Admitting: Family Medicine

## 2020-01-05 DIAGNOSIS — Z1231 Encounter for screening mammogram for malignant neoplasm of breast: Secondary | ICD-10-CM | POA: Diagnosis not present

## 2020-01-05 LAB — HM MAMMOGRAPHY

## 2020-01-11 ENCOUNTER — Encounter: Payer: Self-pay | Admitting: Family Medicine

## 2020-02-10 ENCOUNTER — Encounter: Payer: Self-pay | Admitting: Obstetrics and Gynecology

## 2020-02-10 ENCOUNTER — Other Ambulatory Visit: Payer: Self-pay

## 2020-02-10 ENCOUNTER — Other Ambulatory Visit: Payer: Self-pay | Admitting: Obstetrics and Gynecology

## 2020-02-10 ENCOUNTER — Ambulatory Visit: Payer: Medicare Other | Admitting: Obstetrics and Gynecology

## 2020-02-10 VITALS — BP 122/80

## 2020-02-10 DIAGNOSIS — R8290 Unspecified abnormal findings in urine: Secondary | ICD-10-CM

## 2020-02-10 DIAGNOSIS — L700 Acne vulgaris: Secondary | ICD-10-CM | POA: Diagnosis not present

## 2020-02-10 NOTE — Progress Notes (Signed)
LAB

## 2020-02-10 NOTE — Progress Notes (Signed)
   Ashlee Allen 05/24/1950 [id number]  SUBJECTIVE:  69 y.o. [id number] female presents for evaluation of a bump her spot on the left mons pubis area has been present for several days.  She noticed it when cleansing/wiping in the area.  Not have any pain or redness.  She indicates that she at one time and had some sort of abscess there in the years past that required lancing and drainage for MRSA. She also complains of some urinary/vaginal odor.  No vaginal discharge.  No Neri frequency or painful urination.  She says that typically in the past for sign of UTI for her is urinary odor even if it does not indicate infection on the urinalysis and it does ultimately on the culture.  No fever or chills.  No pelvic pain.  Current Outpatient Medications  Medication Sig Dispense Refill  . albuterol (VENTOLIN HFA) 108 (90 Base) MCG/ACT inhaler Inhale 1-2 puffs into the lungs every 6 (six) hours as needed for wheezing or shortness of breath. 8 g 0  . calcium carbonate (TUMS) 500 MG chewable tablet Chew 1 tablet by mouth as needed for indigestion or heartburn.    . fluticasone (FLONASE) 50 MCG/ACT nasal spray Place 2 sprays into both nostrils daily. 1 g 0  . hydroxychloroquine (PLAQUENIL) 200 MG tablet 1-2 tabs per day (wintertime 1 tab a day, summertime 2 tabs a day)    . ipratropium (ATROVENT) 0.06 % nasal spray Place 2 sprays into both nostrils 4 (four) times daily. 15 mL 0   No current facility-administered medications for this visit.   Allergies: Latex, Lipitor [atorvastatin calcium], and Varenicline tartrate  No LMP recorded. Patient has had a hysterectomy.  Past medical history,surgical history, problem list, medications, allergies, family history and social history were all reviewed and documented as reviewed in the EPIC chart.  ROS: Pertinent positives and negatives as reviewed in HPI   OBJECTIVE:  BP 122/80 (BP Location: Right Arm, Patient Position: Sitting, Cuff Size: Normal)  The patient  appears well, alert, oriented, in no distress. PELVIC EXAM: VULVA: On the left mons pubis there is a comedone without any surrounding erythema, no drainage, no fluctuance, no purulence.  No vaginal discharge present at the vaginal introitus/distal vagina. Urinalysis: Unable to leave in sample  Chaperone: Wandra Scot Bonham present during the examination  ASSESSMENT:  69 y.o. [id number] here for evaluation of a comedone the mons pubis and urinary odor  PLAN:  1.  Comedone in mons pubis.  Discussed this does not appear infected.  Would stay away from greasy topical antibiotics.  May consider wiping over the area a time or two daily with rubbing alcohol if she is worried about infection.  Stop if there is any skin irritation.  I would not endorse trying to expel the blackhead with squeezing of the skin, rather I suggested that she try warm soaks in the tub few times a day or warm compresses.  If any development of surrounding erythema, purulence, drainage she needs to be evaluated. 2.  Urinary odor: Will return in the coming days to leave a urine specimen.  She knows to seek immediate evaluation if any acute urinary symptoms/fever.   Joseph Pierini MD 02/10/20

## 2020-02-13 ENCOUNTER — Other Ambulatory Visit: Payer: Self-pay

## 2020-02-13 ENCOUNTER — Other Ambulatory Visit: Payer: Medicare PPO

## 2020-02-13 DIAGNOSIS — R8290 Unspecified abnormal findings in urine: Secondary | ICD-10-CM

## 2020-02-15 MED ORDER — SULFAMETHOXAZOLE-TRIMETHOPRIM 800-160 MG PO TABS: 1.0000 | ORAL_TABLET | Freq: Two times a day (BID) | ORAL | 0 refills | Status: AC

## 2020-02-15 NOTE — Addendum Note (Signed)
Addended byDelilah Shan, Yama Nielson D on: 02/15/2020 10:06 AM   Modules accepted: Orders

## 2020-02-16 LAB — URINALYSIS, COMPLETE W/RFL CULTURE
Bilirubin Urine: NEGATIVE
Glucose, UA: NEGATIVE
Hgb urine dipstick: NEGATIVE
Hyaline Cast: NONE SEEN /LPF
Ketones, ur: NEGATIVE
Nitrites, Initial: NEGATIVE
Protein, ur: NEGATIVE
Specific Gravity, Urine: 1.011 (ref 1.001–1.03)
Squamous Epithelial / HPF: NONE SEEN /HPF (ref <=5)
pH: 5.5 (ref 5.0–8.0)

## 2020-02-16 LAB — URINE CULTURE
MICRO NUMBER:: 11283189
SPECIMEN QUALITY:: ADEQUATE

## 2020-02-16 LAB — CULTURE INDICATED

## 2020-05-13 ENCOUNTER — Ambulatory Visit
Admission: RE | Admit: 2020-05-13 | Discharge: 2020-05-13 | Disposition: A | Discharge status: Home / Self Care | Payer: Medicare PPO | Source: Ambulatory Visit | Attending: Emergency Medicine | Admitting: Emergency Medicine

## 2020-05-13 ENCOUNTER — Other Ambulatory Visit: Payer: Self-pay

## 2020-05-13 ENCOUNTER — Ambulatory Visit (INDEPENDENT_AMBULATORY_CARE_PROVIDER_SITE_OTHER): Payer: Medicare PPO

## 2020-05-13 VITALS — BP 144/81 | HR 102 | Temp 97.9°F | Resp 18

## 2020-05-13 DIAGNOSIS — R0789 Other chest pain: Secondary | ICD-10-CM | POA: Insufficient documentation

## 2020-05-13 DIAGNOSIS — F17210 Nicotine dependence, cigarettes, uncomplicated: Secondary | ICD-10-CM | POA: Insufficient documentation

## 2020-05-13 DIAGNOSIS — J22 Unspecified acute lower respiratory infection: Secondary | ICD-10-CM | POA: Diagnosis not present

## 2020-05-13 DIAGNOSIS — M329 Systemic lupus erythematosus, unspecified: Secondary | ICD-10-CM | POA: Diagnosis not present

## 2020-05-13 DIAGNOSIS — R350 Frequency of micturition: Secondary | ICD-10-CM | POA: Diagnosis not present

## 2020-05-13 DIAGNOSIS — R059 Cough, unspecified: Secondary | ICD-10-CM | POA: Diagnosis not present

## 2020-05-13 DIAGNOSIS — Z79899 Other long term (current) drug therapy: Secondary | ICD-10-CM | POA: Insufficient documentation

## 2020-05-13 DIAGNOSIS — R0781 Pleurodynia: Secondary | ICD-10-CM

## 2020-05-13 DIAGNOSIS — I700 Atherosclerosis of aorta: Secondary | ICD-10-CM | POA: Diagnosis not present

## 2020-05-13 LAB — POCT URINALYSIS DIP (MANUAL ENTRY)
Bilirubin, UA: NEGATIVE
Blood, UA: NEGATIVE
Glucose, UA: NEGATIVE mg/dL
Ketones, POC UA: NEGATIVE mg/dL
Nitrite, UA: NEGATIVE
Protein Ur, POC: NEGATIVE mg/dL
Spec Grav, UA: 1.015 (ref 1.010–1.025)
Urobilinogen, UA: 0.2 E.U./dL
pH, UA: 6 (ref 5.0–8.0)

## 2020-05-13 MED ORDER — PREDNISONE 20 MG PO TABS: 40.0000 mg | ORAL_TABLET | Freq: Every day | ORAL | 0 refills | Status: DC

## 2020-05-13 MED ORDER — BENZONATATE 200 MG PO CAPS: 200.0000 mg | ORAL_CAPSULE | Freq: Three times a day (TID) | ORAL | 0 refills | Status: AC | PRN

## 2020-05-13 MED ORDER — GUAIFENESIN-CODEINE 100-10 MG/5ML PO SOLN: 5.0000 mL | Freq: Every evening | ORAL | 0 refills | Status: DC | PRN

## 2020-05-13 MED ORDER — AZITHROMYCIN 250 MG PO TABS: 250.0000 mg | ORAL_TABLET | Freq: Every day | ORAL | 0 refills | Status: DC

## 2020-05-13 MED ORDER — ALBUTEROL SULFATE HFA 108 (90 BASE) MCG/ACT IN AERS: 1.0000 | INHALATION_SPRAY | Freq: Four times a day (QID) | RESPIRATORY_TRACT | 0 refills | Status: DC | PRN

## 2020-05-13 NOTE — ED Provider Notes (Signed)
[facility]    CSN: [id number] Arrival date & time: 05/13/20  1346      History   Chief Complaint Chief Complaint  Patient presents with  . Cough  . Urinary Frequency  . Muscle Pain    Left side    HPI Ashlee Allen is a 70 y.o. female history of lupus, presenting today for evaluation of rib pain and possible UTI.  Patient reports that she has had left-sided muscle pain that she felt after putting her granddaughter in bed.  Felt a popping sensation in this area.  Had associated cough which is worsened over the past couple of weeks.  Reports history of tobacco use.  Also concerned about possible UTI.  Reports urine has had an odor of recently.  Reports often her UTIs will start with urinary odor initially.  Denies any significant dysuria urgency or frequency at this time.  HPI  Past Medical History:  Diagnosis Date  . Cardiac murmur    echo prev done  . Depression   . Discoid lupus    Dr. Kalman Shan (prev seen at [facility]) and Dr. Ubaldo Glassing  . Fibroid   . GERD (gastroesophageal reflux disease)    controllled with weight loss   . Hyperlipidemia   . Pulmonary nodule     Patient Active Problem List   Diagnosis Date Noted  . Lower back pain 06/19/2019  . Congestion of upper airway 06/19/2019  . Medicare annual wellness visit, subsequent 01/19/2019  . URI (upper respiratory infection) 05/25/2018  . Left arm pain 04/11/2018  . Neck pain 02/22/2018  . Rash 05/27/2017  . Health care maintenance 01/14/2017  . Dysuria 01/14/2017  . Pulmonary nodule 04/23/2016  . HLD (hyperlipidemia) 12/25/2015  . Advance care planning 12/13/2014  . Smoker 10/28/2012  . Urinary incontinence 10/28/2012  . History of cervical dysplasia 10/28/2012  . Menopause 10/14/2010  . 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  . GERD 01/15/2010  . LUPUS ERYTHEMATOSUS, DISCOID 01/15/2010    Past Surgical History:  Procedure Laterality Date  . ABDOMINAL HYSTERECTOMY  1997   TAH  [place]  .  BREAST SURGERY  2001   Duct excised-rt  . CATARACT EXTRACTION, BILATERAL    . CHOLECYSTECTOMY  1991  . COLONOSCOPY    . HAND SURGERY  2001   ctr-both  . INNER EAR SURGERY     left- mastoidectomy  . KNEE ARTHROSCOPY Left 05/20/2013   Procedure: LEFT KNEE ARTHROSCOPY WITH DEBRIDEMENT/SHAVING (CHONDROPLASTY) AND LATERAL AND MEDIAL MENISECTOMY, EXCISION OF PLICA;  Surgeon: Yvette Rack., MD;  Location: [facility];  Service: Orthopedics;  Laterality: Left;  . LAPAROSCOPIC APPENDECTOMY N/A 04/22/2016   Procedure: APPENDECTOMY LAPAROSCOPIC;  Surgeon: Alphonsa Overall, MD;  Location: [facility];  Service: General;  Laterality: N/A;    OB History    Gravida  3   Para  2   Term  2   Preterm      AB  1   Living  2     SAB      IAB      Ectopic      Multiple      Live Births               Home Medications    Prior to Admission medications   Medication Sig Start Date End Date Taking? Authorizing Provider  albuterol (VENTOLIN HFA) 108 (90 Base) MCG/ACT inhaler Inhale 1-2 puffs into the lungs every 6 (six) hours as needed for wheezing or  shortness of breath. 05/13/20  Yes Yenifer Saccente C, PA-C  azithromycin (ZITHROMAX) 250 MG tablet Take 1 tablet (250 mg total) by mouth daily. Take first 2 tablets together, then 1 every day until finished. 05/13/20  Yes Chenille Toor C, PA-C  benzonatate (TESSALON) 200 MG capsule Take 1 capsule (200 mg total) by mouth 3 (three) times daily as needed for up to 7 days for cough. 05/13/20 05/20/20 Yes Jamy Whyte C, PA-C  guaiFENesin-codeine 100-10 MG/5ML syrup Take 5 mLs by mouth at bedtime as needed for cough. 05/13/20  Yes Travas Schexnayder C, PA-C  predniSONE (DELTASONE) 20 MG tablet Take 2 tablets (40 mg total) by mouth daily with breakfast for 5 days. 05/13/20 05/18/20 Yes Tolulope Pinkett C, PA-C  calcium carbonate (TUMS) 500 MG chewable tablet Chew 1 tablet by mouth as needed for indigestion or heartburn.    [provider]   fluticasone (FLONASE) 50 MCG/ACT nasal spray Place 2 sprays into both nostrils daily. 10/02/19   Tasia Catchings, Amy V, PA-C  hydroxychloroquine (PLAQUENIL) 200 MG tablet 1-2 tabs per day (wintertime 1 tab a day, summertime 2 tabs a day) 01/12/17   Tonia Ghent, MD  ipratropium (ATROVENT) 0.06 % nasal spray Place 2 sprays into both nostrils 4 (four) times daily. 10/02/19   Tasia Catchings, Amy V, PA-C  Omeprazole (PRILOSEC PO) Take by mouth as needed.    06/03/11  [provider]    Family History Family History  Problem Relation Age of Onset  . Alcohol abuse Mother   . Hyperlipidemia Mother   . Hypertension Mother   . Diabetes Mother   . Cancer Mother        LUNG- SMOKER   . Alcohol abuse Father   . Cancer Father        lung  . Seizures Brother   . Colon cancer Neg Hx   . Breast cancer Neg Hx     Social History Social History   Tobacco Use  . Smoking status: Current Every Day Smoker    Packs/day: 0.50    Years: 40.00    Pack years: 20.00  . Smokeless tobacco: Never Used  Vaping Use  . Vaping Use: Never used  Substance Use Topics  . Alcohol use: No    Alcohol/week: 0.0 standard drinks  . Drug use: No     Allergies   Latex, Lipitor [atorvastatin calcium], and Varenicline tartrate   Review of Systems Review of Systems  Constitutional: Negative for activity change, appetite change, chills, fatigue and fever.  HENT: Negative for congestion, ear pain, rhinorrhea, sinus pressure, sore throat and trouble swallowing.   Eyes: Negative for discharge and redness.  Respiratory: Positive for cough. Negative for chest tightness and shortness of breath.   Cardiovascular: Positive for chest pain.  Gastrointestinal: Negative for abdominal pain, diarrhea, nausea and vomiting.  Genitourinary: Negative for dysuria, flank pain, genital sores, hematuria, menstrual problem, vaginal bleeding, vaginal discharge and vaginal pain.  Musculoskeletal: Negative for back pain and myalgias.  Skin: Negative  for rash.  Neurological: Negative for dizziness, light-headedness and headaches.     Physical Exam Triage Vital Signs ED Triage Vitals [05/13/20 1358]  Enc Vitals Group     BP (!) 144/81     Pulse Rate (!) 102     Resp 18     Temp 97.9 F (36.6 C)     Temp Source Oral     SpO2 97 %     Weight      Height  Head Circumference      Peak Flow      Pain Score 0     Pain Loc      Pain Edu?      Excl. in [place]?    No data found.  Updated Vital Signs BP (!) 144/81 (BP Location: Right Arm)   Pulse (!) 102   Temp 97.9 F (36.6 C) (Oral)   Resp 18   SpO2 97%   Visual Acuity Right Eye Distance:   Left Eye Distance:   Bilateral Distance:    Right Eye Near:   Left Eye Near:    Bilateral Near:     Physical Exam Vitals and nursing note reviewed.  Constitutional:      Appearance: She is well-developed and well-nourished.     Comments: No acute distress  HENT:     Head: Normocephalic and atraumatic.     Ears:     Comments: Bilateral ears without tenderness to palpation of external auricle, tragus and mastoid, EAC's without erythema or swelling, TM's with good bony landmarks and cone of light. Non erythematous.     Nose: Nose normal.     Mouth/Throat:     Comments: Oral mucosa pink and moist, no tonsillar enlargement or exudate. Posterior pharynx patent and nonerythematous, no uvula deviation or swelling. Normal phonation. Eyes:     Conjunctiva/sclera: Conjunctivae normal.  Cardiovascular:     Rate and Rhythm: Normal rate.  Pulmonary:     Effort: Pulmonary effort is normal. No respiratory distress.     Comments: Breathing comfortably at rest, CTABL, no wheezing, rales or other adventitious sounds auscultated  Tenderness to palpation to left lower anterior ribs Abdominal:     General: There is no distension.  Musculoskeletal:        General: Normal range of motion.     Cervical back: Neck supple.  Skin:    General: Skin is warm and dry.  Neurological:     Mental  Status: She is alert and oriented to person, place, and time.  Psychiatric:        Mood and Affect: Mood and affect normal.      UC Treatments / Results  Labs (all labs ordered are listed, but only abnormal results are displayed) Labs Reviewed  POCT URINALYSIS DIP (MANUAL ENTRY) - Abnormal; Notable for the following components:      Result Value   Leukocytes, UA Small (1+) (*)    All other components within normal limits  URINE CULTURE    EKG   Radiology DG Ribs Unilateral W/Chest Left  Result Date: 05/13/2020 CLINICAL DATA:  52-year-old female with cough for several weeks and lower rib pain. EXAM: LEFT RIBS AND CHEST - 3+ VIEW COMPARISON:  Chest CT from 04/26/2018 FINDINGS: No fracture or other bone lesions are seen involving the ribs. There is no evidence of pneumothorax or pleural effusion. Both lungs are clear. Heart size and mediastinal contours are within normal limits. Atherosclerotic calcification of the aortic arch. IMPRESSION: No evidence of rib fracture. No acute cardiopulmonary process. Aortic Atherosclerosis (ICD10-I70.0). Electronically Signed   By: Ruthann Cancer MD   On: 05/13/2020 14:48    Procedures Procedures (including critical care time)  Medications Ordered in UC Medications - No data to display  Initial Impression / Assessment and Plan / UC Course  I have reviewed the triage vital signs and the nursing notes.  Pertinent labs & imaging results that were available during my care of the patient were reviewed by me  and considered in my medical decision making (see chart for details).     Chest x-ray without signs of rib fracture, no pneumonia, treating for bronchitis, cough times weeks, covering for atypicals with azithromycin, Tessalon for cough, prednisone course and albuterol inhaler.  Robitussin with codeine at bedtime.  Suspect chest discomfort likely muscular straining, continue anti-inflammatories.  UA with small leuks, otherwise unremarkable, urine  culture pending.  Deferring empiric treatment until culture returns.  Push fluids.  Discussed strict return precautions. Patient verbalized understanding and is agreeable with plan.  Final Clinical Impressions(s) / UC Diagnoses   Final diagnoses:  Chest wall pain  Lower respiratory infection (e.g., bronchitis, pneumonia, pneumonitis, pulmonitis)     Discharge Instructions     No signs of rib fractures Urine culture pending Begin azithromycin Prednisone daily for 5 days Tessalon for daytime cough Robitussin with codeine at bedtime Follow-up if not improving or worsening    ED Prescriptions    Medication Sig Dispense Auth. Provider   predniSONE (DELTASONE) 20 MG tablet Take 2 tablets (40 mg total) by mouth daily with breakfast for 5 days. 10 tablet Rhett Mutschler C, PA-C   azithromycin (ZITHROMAX) 250 MG tablet Take 1 tablet (250 mg total) by mouth daily. Take first 2 tablets together, then 1 every day until finished. 6 tablet Monette Omara C, PA-C   benzonatate (TESSALON) 200 MG capsule Take 1 capsule (200 mg total) by mouth 3 (three) times daily as needed for up to 7 days for cough. 28 capsule Jafari Mckillop C, PA-C   guaiFENesin-codeine 100-10 MG/5ML syrup Take 5 mLs by mouth at bedtime as needed for cough. 120 mL Mykaylah Ballman C, PA-C   albuterol (VENTOLIN HFA) 108 (90 Base) MCG/ACT inhaler Inhale 1-2 puffs into the lungs every 6 (six) hours as needed for wheezing or shortness of breath. 8 g Akire Rennert, [place] C, PA-C     PDMP not reviewed this encounter.   Janith Lima, [place] 05/13/20 1609

## 2020-05-13 NOTE — Discharge Instructions (Addendum)
No signs of rib fractures Urine culture pending Begin azithromycin Prednisone daily for 5 days Tessalon for daytime cough Robitussin with codeine at bedtime Follow-up if not improving or worsening

## 2020-05-13 NOTE — ED Triage Notes (Signed)
Pt present left side muscle pain, pt states she is unsure if she pulled a muscle but she felt a pop when she was putting her granddaughter back in bed. She also is C/O possible UTI because her Urinary has an odor. Then a frequent cough that has gotten worst for the last couple of weeks.

## 2020-05-16 ENCOUNTER — Telehealth (HOSPITAL_COMMUNITY): Payer: Self-pay | Admitting: Emergency Medicine

## 2020-05-16 LAB — URINE CULTURE: Culture: 60000 — AB

## 2020-05-16 MED ORDER — NITROFURANTOIN MONOHYD MACRO 100 MG PO CAPS: 100.0000 mg | ORAL_CAPSULE | Freq: Two times a day (BID) | ORAL | 0 refills | Status: DC

## 2020-05-22 DIAGNOSIS — H900 Conductive hearing loss, bilateral: Secondary | ICD-10-CM | POA: Diagnosis not present

## 2020-05-22 DIAGNOSIS — H7293 Unspecified perforation of tympanic membrane, bilateral: Secondary | ICD-10-CM | POA: Diagnosis not present

## 2020-05-23 DIAGNOSIS — Z79899 Other long term (current) drug therapy: Secondary | ICD-10-CM | POA: Diagnosis not present

## 2020-05-23 DIAGNOSIS — L918 Other hypertrophic disorders of the skin: Secondary | ICD-10-CM | POA: Diagnosis not present

## 2020-05-23 DIAGNOSIS — L930 Discoid lupus erythematosus: Secondary | ICD-10-CM | POA: Diagnosis not present

## 2020-05-23 DIAGNOSIS — L814 Other melanin hyperpigmentation: Secondary | ICD-10-CM | POA: Diagnosis not present

## 2020-06-12 ENCOUNTER — Encounter: Payer: Self-pay | Admitting: Family Medicine

## 2020-06-12 ENCOUNTER — Other Ambulatory Visit: Payer: Self-pay

## 2020-06-12 ENCOUNTER — Ambulatory Visit (INDEPENDENT_AMBULATORY_CARE_PROVIDER_SITE_OTHER): Payer: Medicare PPO | Admitting: Family Medicine

## 2020-06-12 VITALS — BP 120/82 | HR 63 | Temp 97.5°F | Ht 65.0 in | Wt 159.0 lb

## 2020-06-12 DIAGNOSIS — Z1211 Encounter for screening for malignant neoplasm of colon: Secondary | ICD-10-CM

## 2020-06-12 DIAGNOSIS — R21 Rash and other nonspecific skin eruption: Secondary | ICD-10-CM

## 2020-06-12 DIAGNOSIS — R300 Dysuria: Secondary | ICD-10-CM | POA: Diagnosis not present

## 2020-06-12 LAB — POC URINALSYSI DIPSTICK (AUTOMATED)
Bilirubin, UA: NEGATIVE
Blood, UA: NEGATIVE
Glucose, UA: NEGATIVE
Ketones, UA: NEGATIVE
Leukocytes, UA: NEGATIVE
Nitrite, UA: NEGATIVE
Protein, UA: NEGATIVE
Spec Grav, UA: 1.01 (ref 1.010–1.025)
Urobilinogen, UA: 0.2 E.U./dL
pH, UA: 6 (ref 5.0–8.0)

## 2020-06-12 MED ORDER — SULFAMETHOXAZOLE-TRIMETHOPRIM 800-160 MG PO TABS: 1.0000 | ORAL_TABLET | Freq: Two times a day (BID) | ORAL | 0 refills | Status: DC

## 2020-06-12 NOTE — Progress Notes (Signed)
This visit occurred during the SARS-CoV-2 public health emergency.  Safety protocols were in place, including screening questions prior to the visit, additional usage of staff PPE, and extensive cleaning of exam room while observing appropriate contact time as indicated for disinfecting solutions.  Dysuria: change in odor and frequency noted.   duration of symptoms: see below.   abdominal pain: no Fevers: no back pain: only from playing with grandkids, she strained chest wall lifting her into the crib and that is getting better.   Vomiting: no  Prev UTI ucx positive, tx'd at the time.  She got better but unclear if totally resolved.     Rash on abd for a few weeks.  H/o similar in the past.  She started triamcinolone in the meantime per dermatology.  She'll call derm if not better soon.    She thought she had a hemorrhoid.  D/w pt.  Small amount of blood passed a few weeks ago.  No sx currently.    Meds, vitals, and allergies reviewed.  Per HPI unless specifically indicated in ROS section   GEN: nad, alert and oriented HEENT: ncat NECK: supple CV: rrr.  Systolic murmur noted, old finding. PULM: ctab, no inc wob ABD: soft, +bs, suprapubic area not tender EXT: no edema SKIN: faint blanching maculopapular rash on the B abdomen.   BACK: no CVA pain

## 2020-06-12 NOTE — Patient Instructions (Addendum)
If more trouble in the meantime, then start septra.  We'll update you about your labs.  Take care.  Glad to see you. We'll call about seeing the GI clinic.

## 2020-06-13 DIAGNOSIS — Z1211 Encounter for screening for malignant neoplasm of colon: Secondary | ICD-10-CM | POA: Insufficient documentation

## 2020-06-13 NOTE — Assessment & Plan Note (Signed)
She will continue triamcinolone and update dermatology if not better.  She agrees.

## 2020-06-13 NOTE — Assessment & Plan Note (Signed)
Refer to GI.  No symptoms from hemorrhoid currently.

## 2020-06-13 NOTE — Assessment & Plan Note (Signed)
Previous urine culture was positive.  We talked about options.  Await urine culture.  She will hold Septra in the meantime.  If she has progressive symptoms that she will started.

## 2020-06-14 LAB — URINE CULTURE
MICRO NUMBER:: 11733061
SPECIMEN QUALITY:: ADEQUATE

## 2020-06-17 ENCOUNTER — Telehealth: Payer: Self-pay | Admitting: Family Medicine

## 2020-06-17 DIAGNOSIS — R011 Cardiac murmur, unspecified: Secondary | ICD-10-CM

## 2020-06-17 NOTE — Telephone Encounter (Signed)
Notify patient.  Given the timeline of her previous echo (greater than 10 years ago), it would be reasonable to repeat the study just to make sure nothing changed in the meantime.  I went ahead and put in the order.  Please let me know if that is not okay.  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  There was normal left ventricular size and function with normal aortic size.  There was mild tricuspid regurgitation.  Thanks.

## 2020-06-18 NOTE — Telephone Encounter (Signed)
Notified patient about below message. Patient agrees to do echo and will await call to schedule.

## 2020-06-26 ENCOUNTER — Telehealth: Payer: Self-pay | Admitting: Family Medicine

## 2020-06-26 NOTE — Telephone Encounter (Signed)
Attempted to reach patient to schedule. Lvm asking her to call office

## 2020-06-26 NOTE — Telephone Encounter (Signed)
Patient states she will call back later with an appointment, as she has a busy schedule today and tomorrow.

## 2020-06-26 NOTE — Telephone Encounter (Signed)
Noted, agree with other notes below.

## 2020-06-26 NOTE — Telephone Encounter (Signed)
Fwd to Roberta H to schedule OV.

## 2020-06-26 NOTE — Telephone Encounter (Signed)
Pt seen 06/12/20 for dysuria - given abx, completed.  Pt states that she does not feel that her UTI is fully gone.  Pt is still having little symptoms - urinary frequency and decreased output, some vaginal/urine odor  Pt is wanting to know if able to just do a Urine check this week instead of an office visit to recheck her urine.   Please advise, thanks.

## 2020-06-26 NOTE — Telephone Encounter (Signed)
Recommend appointment

## 2020-06-26 NOTE — Telephone Encounter (Addendum)
Do we have available appointments? Can Dutch Quint do virtual visit if she drops off urine sample?  If no appointments available may drop off urine sample to run UA/micro/culture.

## 2020-07-19 ENCOUNTER — Ambulatory Visit (HOSPITAL_COMMUNITY): Payer: Medicare PPO

## 2020-07-29 ENCOUNTER — Ambulatory Visit
Admission: EM | Admit: 2020-07-29 | Discharge: 2020-07-29 | Disposition: A | Discharge status: Home / Self Care | Payer: Medicare PPO | Attending: Nurse Practitioner | Admitting: Nurse Practitioner

## 2020-07-29 ENCOUNTER — Other Ambulatory Visit: Payer: Self-pay

## 2020-07-29 ENCOUNTER — Ambulatory Visit
Admission: EM | Admit: 2020-07-29 | Discharge: 2020-07-29 | Disposition: A | Discharge status: Home / Self Care | Payer: Self-pay

## 2020-07-29 ENCOUNTER — Encounter: Payer: Self-pay | Admitting: *Deleted

## 2020-07-29 DIAGNOSIS — J0100 Acute maxillary sinusitis, unspecified: Secondary | ICD-10-CM

## 2020-07-29 DIAGNOSIS — Z720 Tobacco use: Secondary | ICD-10-CM | POA: Diagnosis not present

## 2020-07-29 DIAGNOSIS — R350 Frequency of micturition: Secondary | ICD-10-CM | POA: Diagnosis not present

## 2020-07-29 LAB — POCT URINALYSIS DIP (MANUAL ENTRY)
Bilirubin, UA: NEGATIVE
Blood, UA: NEGATIVE
Glucose, UA: NEGATIVE mg/dL
Leukocytes, UA: NEGATIVE
Nitrite, UA: NEGATIVE
Protein Ur, POC: 30 mg/dL — AB
Spec Grav, UA: >=1.03 — AB (ref 1.010–1.025)
Urobilinogen, UA: 0.2 E.U./dL
pH, UA: 5.5 (ref 5.0–8.0)

## 2020-07-29 MED ORDER — GUAIFENESIN-CODEINE 100-10 MG/5ML PO SYRP: 10.0000 mL | ORAL_SOLUTION | Freq: Three times a day (TID) | ORAL | 0 refills | Status: DC | PRN

## 2020-07-29 MED ORDER — FLUTICASONE PROPIONATE 50 MCG/ACT NA SUSP: 2.0000 | Freq: Every day | NASAL | 0 refills | Status: DC

## 2020-07-29 MED ORDER — AMOXICILLIN-POT CLAVULANATE 875-125 MG PO TABS: 1.0000 | ORAL_TABLET | Freq: Two times a day (BID) | ORAL | 0 refills | Status: DC

## 2020-07-29 NOTE — ED Provider Notes (Signed)
[facility]    CSN: [id number] Arrival date & time: 07/29/20  1119      History   Chief Complaint Chief Complaint  Patient presents with  . Cough  . Nasal Congestion  . Polyuria    HPI Ashlee Allen is a 70 y.o. female.   Subjective:   Ashlee Allen is a 70 y.o. female who presents for evaluation of possible sinus infection. Symptoms include achiness, congestion, post nasal drip, productive cough with yellow colored sputum and sinus pressure with no fever, chills, night sweats or weight loss. Onset of symptoms was 5 days ago and has been unchanged since that time.  She is drinking plenty of fluids.  She tried over-the-counter agents without much relief in her symptoms.  She has taken several at home COVID test which were negative.  She denies any history of COVID-19.  She has been completely vaccinated with a booster against COVID-19.  Past history is significant for no history of pneumonia or bronchitis. Patient is a smoker.   Ashlee Allen also complains of abnormal smelling urine and frequency for 2 weeks.  Patient denies back pain, fever, vaginal discharge, back pain, dysuria, nausea, vomiting or abdominal pain. Patient does have a history of recurrent UTI.  Patient does not have a history of pyelonephritis.  The following portions of the patient's history were reviewed and updated as appropriate: allergies, current medications, past family history, past medical history, past social history, past surgical history and problem list.            Past Medical History:  Diagnosis Date  . Cardiac murmur    echo prev done  . Discoid lupus    Dr. Kalman Shan (prev seen at [facility]) and Dr. Ubaldo Glassing  . Fibroid   . GERD (gastroesophageal reflux disease)    controllled with weight loss   . Hyperlipidemia   . Pulmonary nodule     Patient Active Problem List   Diagnosis Date Noted  . Colon cancer screening 06/13/2020  . Lower back pain 06/19/2019  . Medicare annual  wellness visit, subsequent 01/19/2019  . Left arm pain 04/11/2018  . Neck pain 02/22/2018  . Rash 05/27/2017  . Health care maintenance 01/14/2017  . Dysuria 01/14/2017  . Pulmonary nodule 04/23/2016  . HLD (hyperlipidemia) 12/25/2015  . Advance care planning 12/13/2014  . Smoker 10/28/2012  . Urinary incontinence 10/28/2012  . History of cervical dysplasia 10/28/2012  . Menopause 10/14/2010  . 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  . GERD 01/15/2010  . LUPUS ERYTHEMATOSUS, DISCOID 01/15/2010    Past Surgical History:  Procedure Laterality Date  . ABDOMINAL HYSTERECTOMY  1997   TAH  [place]  . APPENDECTOMY    . BREAST SURGERY  2001   Duct excised-rt  . CATARACT EXTRACTION, BILATERAL    . CHOLECYSTECTOMY  1991  . COLONOSCOPY    . HAND SURGERY  2001   ctr-both  . INNER EAR SURGERY     left- mastoidectomy  . KNEE ARTHROSCOPY Left 05/20/2013   Procedure: LEFT KNEE ARTHROSCOPY WITH DEBRIDEMENT/SHAVING (CHONDROPLASTY) AND LATERAL AND MEDIAL MENISECTOMY, EXCISION OF PLICA;  Surgeon: Yvette Rack., MD;  Location: [place];  Service: Orthopedics;  Laterality: Left;  . LAPAROSCOPIC APPENDECTOMY N/A 04/22/2016   Procedure: APPENDECTOMY LAPAROSCOPIC;  Surgeon: Alphonsa Overall, MD;  Location: [facility];  Service: General;  Laterality: N/A;    OB History    Gravida  3   Para  2   Term  2  Preterm      AB  1   Living  2     SAB      IAB      Ectopic      Multiple      Live Births               Home Medications    Prior to Admission medications   Medication Sig Start Date End Date Taking? Authorizing Provider  amoxicillin-clavulanate (AUGMENTIN) 875-125 MG tablet Take 1 tablet by mouth every 12 (twelve) hours. 07/29/20  Yes Enrique Sack, FNP  calcium carbonate (TUMS - DOSED IN MG ELEMENTAL CALCIUM) 500 MG chewable tablet Chew 1 tablet by mouth as needed for indigestion or heartburn.   Yes [provider]  fluticasone (FLONASE)  50 MCG/ACT nasal spray Place 2 sprays into both nostrils daily. 07/29/20  Yes Moranda Billiot, Aldona Bar, FNP  guaiFENesin-codeine (ROBITUSSIN AC) 100-10 MG/5ML syrup Take 10 mLs by mouth 3 (three) times daily as needed for cough. 07/29/20  Yes Enrique Sack, FNP  hydroxychloroquine (PLAQUENIL) 200 MG tablet 1-2 tabs per day (wintertime 1 tab a day, summertime 2 tabs a day) 01/12/17  Yes Tonia Ghent, MD  sulfamethoxazole-trimethoprim (BACTRIM DS) 800-160 MG tablet Take 1 tablet by mouth 2 (two) times daily. 06/12/20   Tonia Ghent, MD  triamcinolone cream (KENALOG) 0.1 % Apply 1 application topically 2 (two) times daily.    [provider]  Omeprazole (PRILOSEC PO) Take by mouth as needed.    06/03/11  [provider]    Family History Family History  Problem Relation Age of Onset  . Alcohol abuse Mother   . Hyperlipidemia Mother   . Hypertension Mother   . Diabetes Mother   . Cancer Mother        LUNG- SMOKER   . Alcohol abuse Father   . Cancer Father        lung  . Seizures Brother   . Colon cancer Neg Hx   . Breast cancer Neg Hx     Social History Social History   Tobacco Use  . Smoking status: Current Every Day Smoker    Packs/day: 0.50    Years: 40.00    Pack years: 20.00  . Smokeless tobacco: Never Used  Vaping Use  . Vaping Use: Never used  Substance Use Topics  . Alcohol use: No  . Drug use: No     Allergies   Latex, Lipitor [atorvastatin calcium], and Varenicline tartrate   Review of Systems Review of Systems  Constitutional: Positive for fatigue. Negative for fever.  HENT: Positive for congestion, postnasal drip and sinus pressure.   Respiratory: Positive for cough and wheezing. Negative for shortness of breath.   Gastrointestinal: Negative for nausea and vomiting.  Genitourinary: Positive for frequency. Negative for dysuria.  Musculoskeletal: Positive for myalgias.  Neurological: Negative for headaches.  All other systems reviewed and  are negative.    Physical Exam Triage Vital Signs ED Triage Vitals  Enc Vitals Group     BP 07/29/20 1238 97/68     Pulse Rate 07/29/20 1238 94     Resp 07/29/20 1238 (!) 22     Temp 07/29/20 1238 98.7 F (37.1 C)     Temp Source 07/29/20 1238 Temporal     SpO2 07/29/20 1238 92 %     Weight --      Height --      Head Circumference --      Peak Flow --  Pain Score 07/29/20 1239 5     Pain Loc --      Pain Edu? --      Excl. in [place]? --    No data found.  Updated Vital Signs BP 97/68   Pulse 94   Temp 98.7 F (37.1 C) (Temporal)   Resp (!) 22   SpO2 92%   Visual Acuity Right Eye Distance:   Left Eye Distance:   Bilateral Distance:    Right Eye Near:   Left Eye Near:    Bilateral Near:     Physical Exam Vitals reviewed.  Constitutional:      General: She is not in acute distress.    Appearance: Normal appearance. She is not ill-appearing, toxic-appearing or diaphoretic.  HENT:     Head: Normocephalic.     Right Ear: Tympanic membrane, ear canal and external ear normal.     Left Ear: Tympanic membrane, ear canal and external ear normal.     Nose:     Right Sinus: Maxillary sinus tenderness present.     Left Sinus: Maxillary sinus tenderness present.     Mouth/Throat:     Mouth: Mucous membranes are moist.  Eyes:     Conjunctiva/sclera: Conjunctivae normal.     Pupils: Pupils are equal, round, and reactive to light.  Cardiovascular:     Rate and Rhythm: Normal rate and regular rhythm.  Pulmonary:     Effort: Pulmonary effort is normal.     Breath sounds: No wheezing or rhonchi.  Abdominal:     Tenderness: There is no right CVA tenderness or left CVA tenderness.  Musculoskeletal:        General: Normal range of motion.     Cervical back: Normal range of motion and neck supple.  Lymphadenopathy:     Cervical: No cervical adenopathy.  Skin:    General: Skin is warm and dry.  Neurological:     General: No focal deficit present.     Mental Status:  She is alert and oriented to person, place, and time.  Psychiatric:        Mood and Affect: Mood normal.        Behavior: Behavior normal.      UC Treatments / Results  Labs (all labs ordered are listed, but only abnormal results are displayed) Labs Reviewed  POCT URINALYSIS DIP (MANUAL ENTRY) - Abnormal; Notable for the following components:      Result Value   Ketones, POC UA trace (5) (*)    Spec Grav, UA >=1.030 (*)    Protein Ur, POC =30 (*)    All other components within normal limits  URINE CULTURE    EKG   Radiology No results found.  Procedures Procedures (including critical care time)  Medications Ordered in UC Medications - No data to display  Initial Impression / Assessment and Plan / UC Course  I have reviewed the triage vital signs and the nursing notes.  Pertinent labs & imaging results that were available during my care of the patient were reviewed by me and considered in my medical decision making (see chart for details).    70 year old female presenting with achiness, congestion, postnasal drainage, productive cough, sinus pressure, urinary frequency with odor.  Patient is afebrile.  Nontoxic-appearing.  Constant congested cough noted during exam however, lung sounds clear to auscultation bilaterally.  Patient does have maxillary sinus tenderness noted as well.  Urinalysis unremarkable.  Patient states that she often has unremarkable urinalysis with  bacteria growth on cultures.  Will prescribe Augmentin as well as supportive care measures.  Patient advised to maintain adequate hydration.  Smoking cessation advised.  Follow-up with primary care if symptoms not improving.  Today's evaluation has revealed no signs of a dangerous process. Discussed diagnosis with patient and/or guardian. Patient and/or guardian aware of their diagnosis, possible red flag symptoms to watch out for and need for close follow up. Patient and/or guardian understands verbal and written  discharge instructions. Patient and/or guardian comfortable with plan and disposition.  Patient and/or guardian has a clear mental status at this time, good insight into illness (after discussion and teaching) and has clear judgment to make decisions regarding their care  This care was provided during an unprecedented National Emergency due to the Novel Coronavirus (COVID-19) pandemic. COVID-19 infections and transmission risks place heavy strains on healthcare resources.  As this pandemic evolves, our facility, providers, and staff strive to respond fluidly, to remain operational, and to provide care relative to available resources and information. Outcomes are unpredictable and treatments are without well-defined guidelines. Further, the impact of COVID-19 on all aspects of urgent care, including the impact to patients seeking care for reasons other than COVID-19, is unavoidable during this national emergency. At this time of the global pandemic, management of patients has significantly changed, even for non-COVID positive patients given high local and regional COVID volumes at this time requiring high healthcare system and resource utilization. The standard of care for management of both COVID suspected and non-COVID suspected patients continues to change rapidly at the local, regional, national, and global levels. This patient was worked up and treated to the best available but ever changing evidence and resources available at this current time.   Documentation was completed with the aid of voice recognition software. Transcription may contain typographical errors.  Final Clinical Impressions(s) / UC Diagnoses   Final diagnoses:  Urinary frequency  Acute maxillary sinusitis, recurrence not specified  Tobacco abuse   Discharge Instructions   None    ED Prescriptions    Medication Sig Dispense Auth. Provider   amoxicillin-clavulanate (AUGMENTIN) 875-125 MG tablet Take 1 tablet by mouth every 12  (twelve) hours. 14 tablet Cheryel Kyte, Aldona Bar, FNP   fluticasone (FLONASE) 50 MCG/ACT nasal spray Place 2 sprays into both nostrils daily. 15 mL Kendalynn Wideman, [place], FNP   guaiFENesin-codeine (ROBITUSSIN AC) 100-10 MG/5ML syrup Take 10 mLs by mouth 3 (three) times daily as needed for cough. 68 each Enrique Sack, FNP     PDMP not reviewed this encounter.   Enrique Sack, [place] 07/29/20 1352

## 2020-07-29 NOTE — ED Triage Notes (Addendum)
Reports runny nose, nasal congestion, postnasal drip, facial pressure, HA, fatigue, cough with "a little bit of wheezing".  States started with sore throat and allergy sxs approx 6 days ago with gradual progression of sxs. States has had 3 negative Covid home tests. Denies fevers.  Also c/o polyuria and malodorous urine "for a while".  Wishes to have UA.

## 2020-07-30 ENCOUNTER — Ambulatory Visit: Payer: Self-pay

## 2020-07-30 ENCOUNTER — Encounter: Payer: Self-pay | Admitting: *Deleted

## 2020-07-30 LAB — URINE CULTURE
Culture: <10000 — AB
Special Requests: NORMAL

## 2020-08-16 ENCOUNTER — Other Ambulatory Visit: Payer: Self-pay

## 2020-08-16 ENCOUNTER — Ambulatory Visit (HOSPITAL_COMMUNITY): Payer: Medicare PPO | Attending: Cardiology

## 2020-08-16 DIAGNOSIS — R011 Cardiac murmur, unspecified: Secondary | ICD-10-CM | POA: Diagnosis not present

## 2020-08-16 LAB — ECHOCARDIOGRAM COMPLETE
AR max vel: 2.38 cm2
AV Area VTI: 2.37 cm2
AV Area mean vel: 2.16 cm2
AV Mean grad: 10 mmHg
AV Peak grad: 18.7 mmHg
Ao pk vel: 2.16 m/s
Area-P 1/2: 3.65 cm2
P 1/2 time: 696 msec
S' Lateral: 2.1 cm

## 2020-08-19 ENCOUNTER — Other Ambulatory Visit: Payer: Self-pay | Admitting: Family Medicine

## 2020-08-19 DIAGNOSIS — I517 Cardiomegaly: Secondary | ICD-10-CM

## 2020-08-28 ENCOUNTER — Ambulatory Visit: Payer: Medicare PPO | Admitting: Obstetrics & Gynecology

## 2020-08-31 ENCOUNTER — Ambulatory Visit: Payer: Medicare PPO | Attending: Internal Medicine

## 2020-08-31 ENCOUNTER — Encounter: Payer: Self-pay | Admitting: Registered Nurse

## 2020-08-31 ENCOUNTER — Telehealth (INDEPENDENT_AMBULATORY_CARE_PROVIDER_SITE_OTHER): Payer: Medicare PPO | Admitting: Registered Nurse

## 2020-08-31 ENCOUNTER — Other Ambulatory Visit: Payer: Self-pay

## 2020-08-31 DIAGNOSIS — U071 COVID-19: Secondary | ICD-10-CM

## 2020-08-31 DIAGNOSIS — Z20822 Contact with and (suspected) exposure to covid-19: Secondary | ICD-10-CM | POA: Diagnosis not present

## 2020-08-31 MED ORDER — MOLNUPIRAVIR EUA 200MG CAPSULE: 4.0000 | ORAL_CAPSULE | Freq: Two times a day (BID) | ORAL | 0 refills | Status: AC

## 2020-08-31 MED ORDER — AZELASTINE HCL 0.1 % NA SOLN: 1.0000 | Freq: Two times a day (BID) | NASAL | 12 refills | Status: DC

## 2020-08-31 NOTE — Patient Instructions (Signed)
° ° ° °  If you have lab work done today you will be contacted with your lab results within the next 2 weeks.  If you have not heard from us then please contact us. The fastest way to get your results is to register for My Chart. ° ° °IF you received an x-ray today, you will receive an invoice from [facility]. Please contact [facility] at [phone] with questions or concerns regarding your invoice.  ° °IF you received labwork today, you will receive an invoice from [facility]. Please contact [facility] at [phone] with questions or concerns regarding your invoice.  ° °Our billing staff will not be able to assist you with questions regarding bills from these companies. ° °You will be contacted with the lab results as soon as they are available. The fastest way to get your results is to activate your My Chart account. Instructions are located on the last page of this paperwork. If you have not heard from us regarding the results in 2 weeks, please contact this office. °  ° ° ° °

## 2020-08-31 NOTE — Progress Notes (Signed)
Telemedicine Encounter- SOAP NOTE Established Patient  This telephone encounter was conducted with the patient's (or proxy's) verbal consent via audio telecommunications: yes/no: Yes Patient was instructed to have this encounter in a suitably private space; and to only have persons present to whom they give permission to participate. In addition, patient identity was confirmed by use of name plus two identifiers (DOB and address).  I discussed the limitations, risks, security and privacy concerns of performing an evaluation and management service by telephone and the availability of in person appointments. I also discussed with the patient that there may be a patient responsible charge related to this service. The patient expressed understanding and agreed to proceed.  I spent a total of TIME; 0 MIN TO 60 MIN: 15 minutes talking with the patient or their proxy.  Patient at home Provider in office  Participants: Kathrin Ruddy, NP and Renelda Loma  Chief Complaint  Patient presents with   Covid Positive    Patient states she tes She is is having some cough, body aches , congestion. She has been taking some OTC cold medications with no relief.She took a at home covid test and it was positive    Subjective   Ashlee Allen is a 70 y.o. established patient. 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   Patient Active Problem List   Diagnosis Date Noted   Colon cancer screening 06/13/2020   Lower back pain 06/19/2019   Medicare annual wellness visit, subsequent 01/19/2019   Left arm pain 04/11/2018   Neck pain 02/22/2018   Rash 05/27/2017   Health care maintenance 01/14/2017   Dysuria 01/14/2017   Pulmonary nodule 04/23/2016   HLD  (hyperlipidemia) 12/25/2015   Advance care planning 12/13/2014   Smoker 10/28/2012   Urinary incontinence 10/28/2012   History of cervical dysplasia 10/28/2012   Menopause 10/14/2010   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   GERD 01/15/2010   LUPUS ERYTHEMATOSUS, DISCOID 01/15/2010    Past Medical History:  Diagnosis Date   Cardiac murmur    echo prev done   Discoid lupus    Dr. Kalman Shan (prev seen at [facility]) and Dr. Ubaldo Glassing   Fibroid    GERD (gastroesophageal reflux disease)    controllled with weight loss    Hyperlipidemia    Pulmonary nodule     Current Outpatient Medications  Medication Sig Dispense Refill   azelastine (ASTELIN) 0.1 % nasal spray Place 1 spray into both nostrils 2 (two) times daily. Use in each nostril as directed 30 mL 12   hydroxychloroquine (PLAQUENIL) 200 MG tablet 1-2 tabs per day (wintertime 1 tab a day, summertime 2 tabs a day)     molnupiravir EUA 200 mg CAPS Take 4 capsules (800 mg total) by mouth 2 (two) times daily for 5 days. 40 capsule 0   amoxicillin-clavulanate (AUGMENTIN) 875-125 MG tablet Take 1 tablet by mouth every 12 (twelve) hours. (Patient not taking: Reported on 08/31/2020) 14 tablet 0   calcium carbonate (TUMS - DOSED IN MG ELEMENTAL CALCIUM) 500 MG chewable tablet Chew 1 tablet by mouth as needed for indigestion or heartburn. (Patient not taking: Reported on 08/31/2020)     fluticasone (FLONASE) 50 MCG/ACT nasal spray Place 2 sprays into both nostrils daily. (Patient not taking: Reported on  08/31/2020) 15 mL 0   guaiFENesin-codeine (ROBITUSSIN AC) 100-10 MG/5ML syrup Take 10 mLs by mouth 3 (three) times daily as needed for cough. (Patient not taking: Reported on 08/31/2020) 120 mL 0   sulfamethoxazole-trimethoprim (BACTRIM DS) 800-160 MG tablet Take 1 tablet by mouth 2 (two) times daily. (Patient not taking: Reported on 08/31/2020) 10 tablet 0   triamcinolone cream (KENALOG) 0.1 % Apply 1 application topically 2 (two) times daily. (Patient  not taking: Reported on 08/31/2020)     No current facility-administered medications for this visit.    Allergies  Allergen Reactions   Latex    Lipitor [Atorvastatin Calcium]     Aches with daily dosing   Varenicline Tartrate     Chantix - REACTION: nightmares    Social History   Socioeconomic History   Marital status: Married    Spouse name: Not on file   Number of children: 2   Years of education: Not on file   Highest education level: Not on file  Occupational History   Occupation: Retired Associate Professor: [employer]  Tobacco Use   Smoking status: Every Day    Packs/day: 0.50    Years: 40.00    Pack years: 20.00    Types: Cigarettes   Smokeless tobacco: Never  Vaping Use   Vaping Use: Never used  Substance and Sexual Activity   Alcohol use: No   Drug use: No   Sexual activity: Not on file  Other Topics Concern   Not on file  Social History Narrative   ** Merged History Encounter **       Walking for exercise Married 1978 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     Social Determinants of Health   Financial Resource Strain: Not on file  Food Insecurity: Not on file  Transportation Needs: Not on file  Physical Activity: Not on file  Stress: Not on file  Social Connections: Not on file  Intimate Partner Violence: Not on file    ROS Per hpi, otherwise negative  Objective   Vitals as reported by the patient: There were no vitals filed for this visit.  Ashlee Allen was seen today for covid positive.  Diagnoses and all orders for this visit:  COVID-19 -     molnupiravir EUA 200 mg CAPS; Take 4 capsules (800 mg total) by mouth 2 (two) times daily for 5 days. -     azelastine (ASTELIN) 0.1 % nasal spray; Place 1 spray into both nostrils 2 (two) times daily. Use in each nostril as directed   PLAN Molnupiravir - discussed r/b/se of this medication Azelastine nasal spray for symptom relief Reviewed ER precautions. Return if  worsening or failing to improve Patient encouraged to call clinic with any questions, comments, or concerns.   I discussed the assessment and treatment plan with the patient. The patient was provided an opportunity to ask questions and all were answered. The patient agreed with the plan and demonstrated an understanding of the instructions.   The patient was advised to call back or seek an in-person evaluation if the symptoms worsen or if the condition fails to improve as anticipated.  I provided 15 minutes of non-face-to-face time during this encounter.  Maximiano Coss, NP  Primary Care at [facility]

## 2020-09-01 LAB — SARS-COV-2, NAA 2 DAY TAT

## 2020-09-01 LAB — NOVEL CORONAVIRUS, NAA: SARS-CoV-2, NAA: DETECTED — AB

## 2020-09-24 DIAGNOSIS — Z86010 Personal history of colonic polyps: Secondary | ICD-10-CM | POA: Diagnosis not present

## 2020-09-24 DIAGNOSIS — K219 Gastro-esophageal reflux disease without esophagitis: Secondary | ICD-10-CM | POA: Diagnosis not present

## 2020-09-24 DIAGNOSIS — K5790 Diverticulosis of intestine, part unspecified, without perforation or abscess without bleeding: Secondary | ICD-10-CM | POA: Diagnosis not present

## 2020-11-06 ENCOUNTER — Encounter: Payer: Self-pay | Admitting: Cardiology

## 2020-11-06 DIAGNOSIS — I517 Cardiomegaly: Secondary | ICD-10-CM | POA: Insufficient documentation

## 2020-11-06 NOTE — Progress Notes (Signed)
Cardiology Office Note   Date:  11/07/2020   ID:  [name], DOB [birth year], MRN [id number]  PCP:  Ashlee Ghent, MD  Cardiologist:   None Referring:  Ashlee Ghent, MD  Chief Complaint  Patient presents with   LVH       History of Present Illness: Ashlee Allen is a 70 y.o. female who presents for evaluation of severe LVH.  She is referred by Ashlee Ghent, MD .  She has no past cardiac history other than a murmur.  She had an echo with a calcified aortic valve.  She had an echo that demonstrated both of these findings.     The patient has not Had significant cardiac history.  She has not had an EKG in years.  She is a cigarette smoker and has some baseline mild shortness of breath but she says this is not changed.  She denies chest pain.  She has mild lower extremity swelling.  The patient denies any new symptoms such as chest discomfort, neck or arm discomfort. There has been no new shortness of breath, PND or orthopnea. There have been no reported palpitations, presyncope or syncope.       Past Medical History:  Diagnosis Date   Cardiac murmur    Discoid lupus    Dr. Kalman Shan (prev seen at [facility]) and Dr. Ubaldo Glassing   Fibroid    GERD (gastroesophageal reflux disease)    controllled with weight loss    Hyperlipidemia    Pulmonary nodule     Past Surgical History:  Procedure Laterality Date   ABDOMINAL HYSTERECTOMY  1997   TAH  [facility]   BREAST SURGERY  2001   Duct excised-rt   CATARACT EXTRACTION, BILATERAL     CHOLECYSTECTOMY  1991   COLONOSCOPY     HAND SURGERY  2001   ctr-both   INNER EAR SURGERY     left- mastoidectomy   KNEE ARTHROSCOPY Left 05/20/2013   Procedure: LEFT KNEE ARTHROSCOPY WITH DEBRIDEMENT/SHAVING (CHONDROPLASTY) AND LATERAL AND MEDIAL MENISECTOMY, EXCISION OF PLICA;  Surgeon: Yvette Rack., MD;  Location: [place];  Service: Orthopedics;  Laterality: Left;   LAPAROSCOPIC APPENDECTOMY N/A 04/22/2016   Procedure:  APPENDECTOMY LAPAROSCOPIC;  Surgeon: Alphonsa Overall, MD;  Location: [facility];  Service: General;  Laterality: N/A;     Current Outpatient Medications  Medication Sig Dispense Refill   azelastine (ASTELIN) 0.1 % nasal spray Place 1 spray into both nostrils 2 (two) times daily. Use in each nostril as directed 30 mL 12   calcium carbonate (TUMS - DOSED IN MG ELEMENTAL CALCIUM) 500 MG chewable tablet Chew 1 tablet by mouth as needed for indigestion or heartburn.     diazepam (VALIUM) 2 MG tablet Take 1 tablet (2 mg total) by mouth once for 1 dose. One hour prior to Cardiac MRI 30 tablet 0   fluticasone (FLONASE) 50 MCG/ACT nasal spray Place 2 sprays into both nostrils daily. 15 mL 0   hydroxychloroquine (PLAQUENIL) 200 MG tablet 1-2 tabs per day (wintertime 1 tab a day, summertime 2 tabs a day)     triamcinolone cream (KENALOG) 0.1 % Apply 1 application topically 2 (two) times daily.     No current facility-administered medications for this visit.    Allergies:   Latex, Lipitor [atorvastatin calcium], and Varenicline tartrate    Social History:  The patient  reports that she has been smoking cigarettes. She has a 20.00 pack-year smoking history.  She has never used smokeless tobacco. She reports that she does not drink alcohol and does not use drugs.   Family History:  The patient's family history includes Alcohol abuse in her father and mother; Cancer in her father and mother; Diabetes in her mother; Hyperlipidemia in her mother; Hypertension in her mother; Seizures in her brother.    ROS:  Please see the history of present illness.   Otherwise, review of systems are positive for none.   All other systems are reviewed and negative.    PHYSICAL EXAM: VS:  BP 120/80   Pulse 76   Resp 20   Ht '5\' 5"'$  (1.651 m)   Wt 156 lb (70.8 kg)   SpO2 97%   BMI 25.96 kg/m  , BMI Body mass index is 25.96 kg/m. GENERAL:  Well appearing HEENT:  Pupils equal round and reactive, fundi not visualized, oral  mucosa unremarkable NECK:  No jugular venous distention, waveform within normal limits, carotid upstroke brisk and symmetric, no bruits, no thyromegaly LYMPHATICS:  No cervical, inguinal adenopathy LUNGS:  Clear to auscultation bilaterally BACK:  No CVA tenderness CHEST:  Unremarkable HEART:  PMI not displaced or sustained,S1 and S2 within normal limits, no S3, no S4, no clicks, no rubs, soft apical systolic murmur not increased with valsalva, no diastolic murmurs ABD:  Flat, positive bowel sounds normal in frequency in pitch, no bruits, no rebound, no guarding, no midline pulsatile mass, no hepatomegaly, no splenomegaly EXT:  2 plus pulses throughout, trace  edema, no cyanosis no clubbing SKIN:  No rashes no nodules NEURO:  Cranial nerves II through XII grossly intact, motor grossly intact throughout PSYCH:  Cognitively intact, oriented to person place and time  EKG:  EKG is ordered today. The ekg ordered today demonstrates NSR, rate 76, LAD, LVH with QRS widening.    Recent Labs: No results found for requested labs within last 8760 hours.    Lipid Panel    Component Value Date/Time   CHOL 162 01/11/2019 0756   TRIG 237.0 (H) 01/11/2019 0756   HDL 41.40 01/11/2019 0756   CHOLHDL 4 01/11/2019 0756   VLDL 47.4 (H) 01/11/2019 0756   LDLCALC 94 01/06/2018 1203   LDLDIRECT 96.0 01/11/2019 0756      Wt Readings from Last 3 Encounters:  11/07/20 156 lb (70.8 kg)  06/12/20 159 lb (72.1 kg)  01/18/19 159 lb 5 oz (72.3 kg)      Other studies Reviewed: Additional studies/ records that were reviewed today include: . Review of the above records demonstrates:  Please see elsewhere in the note.     ASSESSMENT AND PLAN:  LVH:   I will start with an MRI given the severity of this finding.  Further testing will be pending these results.  We did talk about salt and fluid management.    DYSLIPIDEMIA:  LDL is mild and she will pursue diet control for now.    TOBACCO:  We talked about  the need to stop smoking.     Current medicines are reviewed at length with the patient today.  The patient does not have concerns regarding medicines.  The following changes have been made:  no change  Labs/ tests ordered today include:   Orders Placed This Encounter  Procedures   MR CARDIAC MORPHOLOGY W WO CONTRAST   CBC   EKG 12-Lead      Disposition:   FU with me after the MRI.     Signed, Minus Breeding, MD  11/07/2020  10:43 AM    [facility]

## 2020-11-07 ENCOUNTER — Other Ambulatory Visit: Payer: Self-pay

## 2020-11-07 ENCOUNTER — Encounter: Payer: Self-pay | Admitting: Cardiology

## 2020-11-07 ENCOUNTER — Ambulatory Visit: Payer: Medicare PPO | Admitting: Cardiology

## 2020-11-07 VITALS — BP 120/80 | HR 76 | Resp 20 | Ht 65.0 in | Wt 156.0 lb

## 2020-11-07 DIAGNOSIS — E785 Hyperlipidemia, unspecified: Secondary | ICD-10-CM | POA: Diagnosis not present

## 2020-11-07 DIAGNOSIS — I517 Cardiomegaly: Secondary | ICD-10-CM | POA: Diagnosis not present

## 2020-11-07 LAB — CBC
Hematocrit: 42 % (ref 34.0–46.6)
Hemoglobin: 14.5 g/dL (ref 11.1–15.9)
MCH: 30.9 pg (ref 26.6–33.0)
MCHC: 34.5 g/dL (ref 31.5–35.7)
MCV: 90 fL (ref 79–97)
Platelets: 180 10*3/uL (ref 150–450)
RBC: 4.69 x10E6/uL (ref 3.77–5.28)
RDW: 12.5 % (ref 11.7–15.4)
WBC: 5.2 10*3/uL (ref 3.4–10.8)

## 2020-11-07 MED ORDER — DIAZEPAM 2 MG PO TABS: 2.0000 mg | ORAL_TABLET | Freq: Once | ORAL | 0 refills | Status: AC

## 2020-11-07 NOTE — Patient Instructions (Signed)
Medication Instructions:  No changes   *If you need a refill on your cardiac medications before your next appointment, please call your pharmacy*   Lab Work:  CBC - today     Testing/Procedures:  Your physician has requested that you have a cardiac MRI. Cardiac MRI uses a computer to create images of your heart as its beating, producing both still and moving pictures of your heart and major blood vessels.  Please follow the instruction sheet given to you today for more information.   Follow-Up: At [facility], you and your health needs are our priority.  As part of our continuing mission to provide you with exceptional heart care, we have created designated Provider Care Teams.  These Care Teams include your primary Cardiologist (physician) and Advanced Practice Providers (APPs -  Physician Assistants and Nurse Practitioners) who all work together to provide you with the care you need, when you need it.     Your next appointment:   6 week(s)  The format for your next appointment:   In Person  Provider:   Minus Breeding, MD

## 2020-11-20 ENCOUNTER — Telehealth: Payer: Self-pay | Admitting: Cardiology

## 2020-11-20 DIAGNOSIS — L814 Other melanin hyperpigmentation: Secondary | ICD-10-CM | POA: Diagnosis not present

## 2020-11-20 DIAGNOSIS — L570 Actinic keratosis: Secondary | ICD-10-CM | POA: Diagnosis not present

## 2020-11-20 DIAGNOSIS — L821 Other seborrheic keratosis: Secondary | ICD-10-CM | POA: Diagnosis not present

## 2020-11-20 DIAGNOSIS — L930 Discoid lupus erythematosus: Secondary | ICD-10-CM | POA: Diagnosis not present

## 2020-11-20 DIAGNOSIS — L309 Dermatitis, unspecified: Secondary | ICD-10-CM | POA: Diagnosis not present

## 2020-11-20 DIAGNOSIS — D692 Other nonthrombocytopenic purpura: Secondary | ICD-10-CM | POA: Diagnosis not present

## 2020-11-20 DIAGNOSIS — D1801 Hemangioma of skin and subcutaneous tissue: Secondary | ICD-10-CM | POA: Diagnosis not present

## 2020-11-20 NOTE — Telephone Encounter (Signed)
   Pt c/o medication issue:  1. Name of Medication: diazepam  2. How are you currently taking this medication (dosage and times per day)?   3. Are you having a reaction (difficulty breathing--STAT)?   4. What is your medication issue? Ronalee Belts with [facility] received a typer prescription request from pt, it was written by Dr. Percival Spanish for diazepam with instruction to take 1 tablet before MRI test, however, the quantity is 30 days. Pharmacist wants to clarify quantity of the prescription

## 2020-11-20 NOTE — Telephone Encounter (Signed)
Called and advised with [facility] it was only for 1 tablet to use once before MRI.  PharmD fixed RX.

## 2020-12-11 ENCOUNTER — Telehealth (HOSPITAL_COMMUNITY): Payer: Self-pay | Admitting: Emergency Medicine

## 2020-12-11 NOTE — Telephone Encounter (Signed)
Reaching out to patient to offer assistance regarding upcoming cardiac imaging study; pt verbalizes understanding of appt date/time, parking situation and where to check in, and verified current allergies; name and call back number provided for further questions should they arise Marchia Bond RN Navigator Cardiac Imaging [facility] [phone] office [phone] cell   Wears hearing aid for [facility] Valium for claustro Denies implants

## 2020-12-13 ENCOUNTER — Other Ambulatory Visit: Payer: Self-pay

## 2020-12-13 ENCOUNTER — Ambulatory Visit (HOSPITAL_COMMUNITY)
Admission: RE | Admit: 2020-12-13 | Discharge: 2020-12-13 | Disposition: A | Discharge status: Home / Self Care | Payer: Medicare PPO | Source: Ambulatory Visit | Attending: Cardiology | Admitting: Cardiology

## 2020-12-13 DIAGNOSIS — E785 Hyperlipidemia, unspecified: Secondary | ICD-10-CM | POA: Diagnosis not present

## 2020-12-13 DIAGNOSIS — I517 Cardiomegaly: Secondary | ICD-10-CM | POA: Diagnosis not present

## 2020-12-13 MED ORDER — GADOBUTROL 1 MMOL/ML IV SOLN
11.0000 mL | Freq: Once | INTRAVENOUS | Status: AC | PRN
  Administered 2020-12-13: 11 mL via INTRAVENOUS

## 2020-12-20 DIAGNOSIS — Z86010 Personal history of colonic polyps: Secondary | ICD-10-CM | POA: Diagnosis not present

## 2020-12-20 DIAGNOSIS — D122 Benign neoplasm of ascending colon: Secondary | ICD-10-CM | POA: Diagnosis not present

## 2020-12-20 DIAGNOSIS — K648 Other hemorrhoids: Secondary | ICD-10-CM | POA: Diagnosis not present

## 2020-12-20 DIAGNOSIS — D175 Benign lipomatous neoplasm of intra-abdominal organs: Secondary | ICD-10-CM | POA: Diagnosis not present

## 2020-12-20 DIAGNOSIS — D120 Benign neoplasm of cecum: Secondary | ICD-10-CM | POA: Diagnosis not present

## 2020-12-20 DIAGNOSIS — K5730 Diverticulosis of large intestine without perforation or abscess without bleeding: Secondary | ICD-10-CM | POA: Diagnosis not present

## 2020-12-20 LAB — HM COLONOSCOPY

## 2020-12-25 DIAGNOSIS — D120 Benign neoplasm of cecum: Secondary | ICD-10-CM | POA: Diagnosis not present

## 2020-12-25 DIAGNOSIS — D122 Benign neoplasm of ascending colon: Secondary | ICD-10-CM | POA: Diagnosis not present

## 2020-12-26 NOTE — Progress Notes (Signed)
Cardiology Office Note   Date:  12/27/2020   ID:  ASHTIN MELICHAR, DOB August 27, 1950, MRN [id number]  PCP:  Tonia Ghent, MD  Cardiologist:   None Referring:  Tonia Ghent, MD  Chief Complaint  Patient presents with   LVH      History of Present Illness: Ashlee Allen is a 70 y.o. female who presents for evaluation of severe LVH.  She is referred by Tonia Ghent, MD .  She has no past cardiac history other than a murmur.  She had an echo with a calcified aortic valve.  She had an echo that demonstrated both of these findings.     At the last visit I ordered an MRI which demonstrated severe concentric left ventricular hypertrophy.  There is an 18 mm basal septum.  There was no evidence of cardiac amyloid.  There was no late gadolinium enhancement.    Since I last saw her she has done well.  She denies any cardiovascular symptoms.  She is not having any chest pressure, neck or arm discomfort.  She is not having any shortness of breath, PND or orthopnea.  She has had no weight gain or edema.  She stays active.  She has no palpitations, presyncope or syncope   Past Medical History:  Diagnosis Date   Cardiac murmur    Discoid lupus    Dr. Kalman Shan (prev seen at [facility]) and Dr. Ubaldo Glassing   Fibroid    GERD (gastroesophageal reflux disease)    controllled with weight loss    Hyperlipidemia    Pulmonary nodule     Past Surgical History:  Procedure Laterality Date   ABDOMINAL HYSTERECTOMY  1997   TAH  [facility]   BREAST SURGERY  2001   Duct excised-rt   CATARACT EXTRACTION, BILATERAL     CHOLECYSTECTOMY  1991   COLONOSCOPY     HAND SURGERY  2001   ctr-both   INNER EAR SURGERY     left- mastoidectomy   KNEE ARTHROSCOPY Left 05/20/2013   Procedure: LEFT KNEE ARTHROSCOPY WITH DEBRIDEMENT/SHAVING (CHONDROPLASTY) AND LATERAL AND MEDIAL MENISECTOMY, EXCISION OF PLICA;  Surgeon: Yvette Rack., MD;  Location: [place];  Service: Orthopedics;  Laterality: Left;    LAPAROSCOPIC APPENDECTOMY N/A 04/22/2016   Procedure: APPENDECTOMY LAPAROSCOPIC;  Surgeon: Alphonsa Overall, MD;  Location: [facility];  Service: General;  Laterality: N/A;     Current Outpatient Medications  Medication Sig Dispense Refill   calcium carbonate (TUMS - DOSED IN MG ELEMENTAL CALCIUM) 500 MG chewable tablet Chew 1 tablet by mouth as needed for indigestion or heartburn.     hydroxychloroquine (PLAQUENIL) 200 MG tablet 1-2 tabs per day (wintertime 1 tab a day, summertime 2 tabs a day)     triamcinolone cream (KENALOG) 0.1 % Apply 1 application topically 2 (two) times daily. As Needed     azelastine (ASTELIN) 0.1 % nasal spray Place 1 spray into both nostrils 2 (two) times daily. Use in each nostril as directed (Patient not taking: Reported on 12/27/2020) 30 mL 12   fluticasone (FLONASE) 50 MCG/ACT nasal spray Place 2 sprays into both nostrils daily. (Patient not taking: Reported on 12/27/2020) 15 mL 0   No current facility-administered medications for this visit.    Allergies:   Latex, Lipitor [atorvastatin calcium], and Varenicline tartrate   ROS:  Please see the history of present illness.   Otherwise, review of systems are positive for none.   All other systems  are reviewed and negative.    PHYSICAL EXAM: VS:  BP 140/70 (BP Location: Left Arm)   Pulse 77   Ht 5' 5.5" (1.664 m)   Wt 158 lb 12.8 oz (72 kg)   SpO2 96%   BMI 26.02 kg/m  , BMI Body mass index is 26.02 kg/m. GENERAL:  Well appearing NECK:  No jugular venous distention, waveform within normal limits, carotid upstroke brisk and symmetric, no bruits, no thyromegaly LUNGS:  Clear to auscultation bilaterally CHEST:  Unremarkable HEART:  PMI not displaced or sustained,S1 and S2 within normal limits, no S3, no S4, no clicks, no rubs, 3 out of 6 apical systolic murmur radiating slightly at the aortic outflow tract, no diastolic murmurs ABD:  Flat, positive bowel sounds normal in frequency in pitch, no bruits, no rebound,  no guarding, no midline pulsatile mass, no hepatomegaly, no splenomegaly EXT:  2 plus pulses throughout, no edema, no cyanosis no clubbing   EKG:  EKG is not ordered today.    Recent Labs: 11/07/2020: Hemoglobin 14.5; Platelets 180    Lipid Panel    Component Value Date/Time   CHOL 162 01/11/2019 0756   TRIG 237.0 (H) 01/11/2019 0756   HDL 41.40 01/11/2019 0756   CHOLHDL 4 01/11/2019 0756   VLDL 47.4 (H) 01/11/2019 0756   LDLCALC 94 01/06/2018 1203   LDLDIRECT 96.0 01/11/2019 0756      Wt Readings from Last 3 Encounters:  12/27/20 158 lb 12.8 oz (72 kg)  11/07/20 156 lb (70.8 kg)  06/12/20 159 lb (72.1 kg)      Other studies Reviewed: Additional studies/ records that were reviewed today include: . Review of the above records demonstrates:  Please see elsewhere in the note.     ASSESSMENT AND PLAN:  LVH:   MRI confirmed severe LVH but without suggestion of amyloid.    At this point this might be a familial issue as I have no other clear etiology.  I do not think this is related to the mild aortic stenosis that she has.  She has not had a long standing history of hypertension.  I am going to do some restratification with a 3-day event monitor.  She has no other high risk findings.  I am going to have her talk with our geneticist.  Further family testing will be based on this conversation.   DYSLIPIDEMIA: She will pursue controlled diet.  TOBACCO:    We again talked about the need to stop smoking.  AS: This has been mild.  No change in therapy.  I will check another echocardiogram in 1 year.  Current medicines are reviewed at length with the patient today.  The patient does not have concerns regarding medicines.  The following changes have been made:  None  Labs/ tests ordered today include:   Orders Placed This Encounter  Procedures   Ambulatory referral to Genetics   ECHOCARDIOGRAM COMPLETE       Disposition:   FU with me in one year.     Signed, Minus Breeding, MD  12/27/2020 12:33 PM    [facility]

## 2020-12-27 ENCOUNTER — Encounter: Payer: Self-pay | Admitting: Cardiology

## 2020-12-27 ENCOUNTER — Other Ambulatory Visit: Payer: Self-pay

## 2020-12-27 ENCOUNTER — Ambulatory Visit (INDEPENDENT_AMBULATORY_CARE_PROVIDER_SITE_OTHER): Payer: Medicare PPO

## 2020-12-27 ENCOUNTER — Other Ambulatory Visit: Payer: Self-pay | Admitting: Cardiology

## 2020-12-27 ENCOUNTER — Ambulatory Visit: Payer: Medicare PPO | Admitting: Cardiology

## 2020-12-27 VITALS — BP 140/70 | HR 77 | Ht 65.5 in | Wt 158.8 lb

## 2020-12-27 DIAGNOSIS — R9431 Abnormal electrocardiogram [ECG] [EKG]: Secondary | ICD-10-CM

## 2020-12-27 DIAGNOSIS — I517 Cardiomegaly: Secondary | ICD-10-CM

## 2020-12-27 DIAGNOSIS — I452 Bifascicular block: Secondary | ICD-10-CM

## 2020-12-27 DIAGNOSIS — I444 Left anterior fascicular block: Secondary | ICD-10-CM

## 2020-12-27 DIAGNOSIS — Z720 Tobacco use: Secondary | ICD-10-CM | POA: Diagnosis not present

## 2020-12-27 DIAGNOSIS — E785 Hyperlipidemia, unspecified: Secondary | ICD-10-CM

## 2020-12-27 NOTE — Patient Instructions (Signed)
Medication Instructions:  No changes *If you need a refill on your cardiac medications before your next appointment, please call your pharmacy*   Lab Work: None ordered If you have labs (blood work) drawn today and your tests are completely normal, you will receive your results only by: [place] (if you have MyChart) OR A paper copy in the mail If you have any lab test that is abnormal or we need to change your treatment, we will call you to review the results.   Testing/Procedures: [facility] [place]- Long Term Monitor Instructions  Your physician has requested you wear a ZIO patch monitor for 3 days.  This is a single patch monitor. [employer] supplies one patch monitor per enrollment. Additional stickers are not available. Please do not apply patch if you will be having a Nuclear Stress Test,  Echocardiogram, Cardiac CT, MRI, or Chest Xray during the period you would be wearing the  monitor. The patch cannot be worn during these tests. You cannot remove and re-apply the  ZIO XT patch monitor.  Your ZIO patch monitor will be mailed 3 day USPS to your address on file. It may take 3-5 days  to receive your monitor after you have been enrolled.  Once you have received your monitor, please review the enclosed instructions. Your monitor  has already been registered assigning a specific monitor serial # to you.  Billing and Patient Assistance Program Information  We have supplied [employer] with any of your insurance information on file for billing purposes. [employer] offers a sliding scale Patient Assistance Program for patients that do not have  insurance, or whose insurance does not completely cover the cost of the ZIO monitor.  You must apply for the Patient Assistance Program to qualify for this discounted rate.  To apply, please call [employer] at [phone], select option 4, select option 2, ask to apply for  Patient Assistance Program. Theodore Demark will ask your household income, and how many  people  are in your household. They will quote your out-of-pocket cost based on that information.  [employer] will also be able to set up a 4-month, interest-free payment plan if needed.  Applying the monitor   Shave hair from upper left chest.  Hold abrader disc by orange tab. Rub abrader in 40 strokes over the upper left chest as  indicated in your monitor instructions.  Clean area with 4 enclosed alcohol pads. Let dry.  Apply patch as indicated in monitor instructions. Patch will be placed under collarbone on left  side of chest with arrow pointing upward.  Rub patch adhesive wings for 2 minutes. Remove white label marked "1". Remove the white  label marked "2". Rub patch adhesive wings for 2 additional minutes.  While looking in a mirror, press and release button in center of patch. A small green light will  flash 3-4 times. This will be your only indicator that the monitor has been turned on.  Do not shower for the first 24 hours. You may shower after the first 24 hours.  Press the button if you feel a symptom. You will hear a small click. Record Date, Time and  Symptom in the Patient Logbook.  When you are ready to remove the patch, follow instructions on the last 2 pages of Patient  Logbook. Stick patch monitor onto the last page of Patient Logbook.  Place Patient Logbook in the blue and white box. Use locking tab on box and tape box closed  securely. The blue and white box  has prepaid postage on it. Please place it in the mailbox as  soon as possible. Your physician should have your test results approximately 7 days after the  monitor has been mailed back to [facility].  Call [place] at [phone] if you have questions regarding  your ZIO XT patch monitor. Call them immediately if you see an orange light blinking on your  monitor.  If your monitor falls off in less than 4 days, contact our Monitor department at [phone].  If your monitor becomes  loose or falls off after 4 days call [employer] at [phone] for  suggestions on securing your monitor  Your physician has requested that you have an echocardiogram in October 2023. Echocardiography is a painless test that uses sound waves to create images of your heart. It provides your doctor with information about the size and shape of your heart and how well your heart's chambers and valves are working. You may receive an ultrasound enhancing agent through an IV if needed to better visualize your heart during the echo.This procedure takes approximately one hour. There are no restrictions for this procedure. This will take place at the [address].    Follow-Up: At [facility], you and your health needs are our priority.  As part of our continuing mission to provide you with exceptional heart care, we have created designated Provider Care Teams.  These Care Teams include your primary Cardiologist (physician) and Advanced Practice Providers (APPs -  Physician Assistants and Nurse Practitioners) who all work together to provide you with the care you need, when you need it.  We recommend signing up for the patient portal called "MyChart".  Sign up information is provided on this After Visit Summary.  MyChart is used to connect with patients for Virtual Visits (Telemedicine).  Patients are able to view lab/test results, encounter notes, upcoming appointments, etc.  Non-urgent messages can be sent to your provider as well.   To learn more about what you can do with MyChart, go to [email or web address].    Your next appointment:   12 month(s)  The format for your next appointment:   In Person  Provider:   Dr. Percival Spanish   Other Instructions A referral has been placed for Dr. Lattie Corns. They will call you for an appointment

## 2020-12-27 NOTE — Progress Notes (Unsigned)
Patient enrolled for [employer] to mail a 3 day ZIO XT monitor to her address on file.

## 2021-01-01 DIAGNOSIS — R9431 Abnormal electrocardiogram [ECG] [EKG]: Secondary | ICD-10-CM

## 2021-01-01 DIAGNOSIS — I444 Left anterior fascicular block: Secondary | ICD-10-CM

## 2021-01-01 DIAGNOSIS — I517 Cardiomegaly: Secondary | ICD-10-CM | POA: Diagnosis not present

## 2021-01-01 DIAGNOSIS — I452 Bifascicular block: Secondary | ICD-10-CM

## 2021-01-10 DIAGNOSIS — I517 Cardiomegaly: Secondary | ICD-10-CM | POA: Diagnosis not present

## 2021-01-10 DIAGNOSIS — I452 Bifascicular block: Secondary | ICD-10-CM | POA: Diagnosis not present

## 2021-01-10 DIAGNOSIS — I444 Left anterior fascicular block: Secondary | ICD-10-CM | POA: Diagnosis not present

## 2021-01-10 DIAGNOSIS — R9431 Abnormal electrocardiogram [ECG] [EKG]: Secondary | ICD-10-CM | POA: Diagnosis not present

## 2021-01-10 DIAGNOSIS — Z1231 Encounter for screening mammogram for malignant neoplasm of breast: Secondary | ICD-10-CM | POA: Diagnosis not present

## 2021-01-10 LAB — HM MAMMOGRAPHY

## 2021-01-21 DIAGNOSIS — Z79899 Other long term (current) drug therapy: Secondary | ICD-10-CM | POA: Diagnosis not present

## 2021-01-21 DIAGNOSIS — H26493 Other secondary cataract, bilateral: Secondary | ICD-10-CM | POA: Diagnosis not present

## 2021-01-21 DIAGNOSIS — Z961 Presence of intraocular lens: Secondary | ICD-10-CM | POA: Diagnosis not present

## 2021-01-22 ENCOUNTER — Encounter: Payer: Self-pay | Admitting: *Deleted

## 2021-02-19 ENCOUNTER — Ambulatory Visit: Payer: Medicare PPO | Admitting: Genetic Counselor

## 2021-02-19 ENCOUNTER — Other Ambulatory Visit: Payer: Self-pay

## 2021-02-19 DIAGNOSIS — I517 Cardiomegaly: Secondary | ICD-10-CM

## 2021-02-20 DIAGNOSIS — H906 Mixed conductive and sensorineural hearing loss, bilateral: Secondary | ICD-10-CM | POA: Diagnosis not present

## 2021-02-25 NOTE — Progress Notes (Signed)
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  Personal Medical Information Ashlee Allen (III.1 on pedigree) is a 70 year-old pleasant Caucasian woman who was found to have a murmur when she had her annual physical.  As her echocardiogram was abnormal she was referred to Dr. Percival Spanish for further evaluation. 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 She denies having symptoms of dyspnea, fatigue, chest pains, tightness, heart palpitations, dizziness or syncope.  Traditional Risk Factors Ashlee Allen denies having HTN but reports having aortic valve stenosis -calcification that can also lead to cardiac hypertrophy.   Family history Ashlee Allen (III.1) has a 68 y.o. daughter (IV.1) and 57 y.o. son (IV.2) -both have not yet been screened for HCM. Her younger brothers are now deceased- one died suddenly at 49 (III.2) from ASCVD and the youngest brother (III.3) was an alcoholic who died at 23. His death certificate states cause of death as sepsis, acute kidney injury and metabolic acidosis.   Ashlee Allen's father (II.1) died at 35 from lung cancer. His parents lived to late 31s/90s and died of old age (I.1, I.2). Ashlee Allen's mother (II.2) also died at 41 from lung cancer. She has several siblings- 4 brothers dead from suicide, stroke, bone cancer and abdominal aneurysm at age range of 41-74 (II.7-II.10). A sister (II.11) died in her sleep at 71. Her mother's living siblings (II.3-II.6) are now in their 27s and 80s and in good health. Maternal grandfather (I.1) was a Ecologist who succumbed to black lung disease at 16 and grandmother (I.4) died at 98 from 16. issues.  Genetics Ashlee Allen was counseled on the genetics of hypertrophic cardiomyopathy (HCM). I explained to her that this is an autosomal  dominant condition and hence her son and daughter have a 50% chance of inheriting HCM. It is recommended that they seek regular surveillance for HCM.  Clinical screening involves echocardiogram and EKG at regular intervals, frequency is typically determined by age, with those over the age of 6 getting screened every 5 years. She verbalized understanding of this.   I discussed penetrance of HCM being incomplete i.e. not all individuals harboring a HCM mutation will present clinically with HCM, and age-related penetrance where clinical presentation of HCM increases with advanced age. Based on her family history it is not clear if she inherited this condition as there are no first-degree relatives with HCM or sudden death. I explained that she may have a de novo mutation for HCM. However, this seems highly unlikely as she has a substantial risk factor of aortic stenosis that can cause cardiac hypertrophy with a concentric morphology.   I also reviewed variable expression of HCM and emphasized that this condition can express at any age at level of severity in the family. Hence, it is important for her son to stay vigilant and seek regular surveillance for HCM. She verbalized understanding of this. I informed her that some patients - about 8-10% can have compound and digenic mutations for HCM. Also briefly discussed the inheritance pattern and treatment /management plans for the infiltrative cardiomyopathies that present as HCM phenocopies.   We walked through the process of genetic testing. I explained to her that genetic testing is a probabilistic test dependent upon her age and severity of presentation, presence of risk factors for HCM and importantly family history of HCM or sudden death in  first-degree relatives. She verbalized understanding of this.   The potential outcomes of genetic testing and subsequent management of at-risk family members were discussed so as to manage expectations-  I explained to  her that if a mutation is not identified, then all first-degree relatives should regular screening for HCM. I emphasized that even if the genetic test is negative, it does not mean that she does not have HCM. A negative test result can be due to limitations of the genetic test. She verbalized understanding of this.  There is also the likelihood of identifying a Variant of unknown significance. This result means that the variant has not been detected in a statistically significant number of HCM patients and functional studies have not been performed to verify its pathogenicity. This VUS can be tested in the family to see if it segregates with disease. If a VUS is found, first-degree relatives should undergo regular clinical screening for HCM.  If a pathogenic variant is reported, then her first-degree family members can get tested for this variant. If he tests positive, there is a high likelihood that he can develop HCM. In light of variable expression and incomplete penetrance associated with HCM, it is not possible to predict when they will manifest clinically with HCM. It is recommended that family members that test positive for the familial pathogenic variant pursue clinical screening for HCM. Family members that test negative for the familial mutation need not pursue periodic screening for HCM, but seek care if the symptoms develop.   Impression and Plan  Ashlee Allen is asymptomatic and found to have concentric left ventricular hypertrophy at age 48 in the presence of an underlying risk factor of aortic stenosis. However, there is no significant family history of HCM or sudden death amongst her first-degree relatives. Genetic testing can be pursued. However, the yield of testing given her underlying risk factor and in the absence of a family history of HCM or sudden death is very low. Additionally, her medical insurance, Medicare, considers genetic testing for HCM investigational and will not cover this test.  I informed her that there are labs that have sponsored low/free testing alternatives. However, such programs do not assure privacy of genetic data.  In addition, we discussed the protections afforded by the Genetic Information Non-Discrimination Act (GINA). I explained to her that GINA protects her from losing her employment or health insurance based on her genotype. However, these protections do not cover life insurance and disability.   Ashlee Allen is reluctant to pursue genetic testing. She would like to discuss this with her children. I informed her that, meanwhile, it is important that her children undergo regular screening for HCM. She verbalized understanding of this.  Please note that the patient has not been counseled in this visit on personal, cultural or ethical issues that she may face due to her heart condition.    Lattie Corns, Ph.D, [facility] Clinical Molecular Geneticist

## 2021-03-24 ENCOUNTER — Other Ambulatory Visit: Payer: Self-pay | Admitting: Family Medicine

## 2021-03-24 DIAGNOSIS — E785 Hyperlipidemia, unspecified: Secondary | ICD-10-CM

## 2021-04-01 NOTE — Progress Notes (Signed)
Subjective:   Ashlee Allen is a 71 y.o. female who presents for Medicare Annual (Subsequent) preventive examination.  I connected with Dally Oshel today by telephone and verified that I am speaking with the correct person using two identifiers. Location patient: home Location provider: work Persons participating in the virtual visit: patient, Marine scientist.    I discussed the limitations, risks, security and privacy concerns of performing an evaluation and management service by telephone and the availability of in person appointments. I also discussed with the patient that there may be a patient responsible charge related to this service. The patient expressed understanding and verbally consented to this telephonic visit.    Interactive audio and video telecommunications were attempted between this provider and patient, however failed, due to patient having technical difficulties OR patient did not have access to video capability.  We continued and completed visit with audio only.  Some vital signs may be absent or patient reported.   Time Spent with patient on telephone encounter: 20 minutes  Review of Systems     Cardiac Risk Factors include: advanced age (>5men, >12 women);dyslipidemia     Objective:    Today's Vitals   04/02/21 1114  Weight: 153 lb (69.4 kg)  Height: 5\' 5"  (1.651 m)   Body mass index is 25.46 kg/m.  Advanced Directives 04/02/2021 01/12/2019 01/06/2018 01/02/2017 04/22/2016 04/21/2016 12/19/2015  Does Patient Have a Medical Advance Directive? Yes Yes Yes Yes Yes No Yes  Type of Paramedic of [place];Living will [place];Living will [place];Living will [place];Living will Living will - [place];Living will  Does patient want to make changes to medical advance directive? Yes (MAU/Ambulatory/Procedural Areas - Information given) - - - No - Patient declined - No -  Patient declined  Copy of [place] in Chart? - No - copy requested No - copy requested No - copy requested - - No - copy requested    Current Medications (verified) Outpatient Encounter Medications as of 04/02/2021  Medication Sig   calcium carbonate (TUMS - DOSED IN MG ELEMENTAL CALCIUM) 500 MG chewable tablet Chew 1 tablet by mouth as needed for indigestion or heartburn.   hydroxychloroquine (PLAQUENIL) 200 MG tablet 1-2 tabs per day (wintertime 1 tab a day, summertime 2 tabs a day)   triamcinolone cream (KENALOG) 0.1 % Apply 1 application topically 2 (two) times daily. As Needed   azelastine (ASTELIN) 0.1 % nasal spray Place 1 spray into both nostrils 2 (two) times daily. Use in each nostril as directed (Patient not taking: Reported on 12/27/2020)   fluticasone (FLONASE) 50 MCG/ACT nasal spray Place 2 sprays into both nostrils daily. (Patient not taking: Reported on 12/27/2020)   No facility-administered encounter medications on file as of 04/02/2021.    Allergies (verified) Latex, Lipitor [atorvastatin calcium], and Varenicline tartrate   History: Past Medical History:  Diagnosis Date   Cardiac murmur    Discoid lupus    Dr. Kalman Shan (prev seen at [facility]) and Dr. Ubaldo Glassing   Fibroid    GERD (gastroesophageal reflux disease)    controllled with weight loss    Hyperlipidemia    Pulmonary nodule    Past Surgical History:  Procedure Laterality Date   ABDOMINAL HYSTERECTOMY  1997   TAH  [place]   BREAST SURGERY  2001   Duct excised-rt   CATARACT EXTRACTION, BILATERAL     CHOLECYSTECTOMY  1991   COLONOSCOPY  HAND SURGERY  2001   ctr-both   INNER EAR SURGERY     left- mastoidectomy   KNEE ARTHROSCOPY Left 05/20/2013   Procedure: LEFT KNEE ARTHROSCOPY WITH DEBRIDEMENT/SHAVING (CHONDROPLASTY) AND LATERAL AND MEDIAL MENISECTOMY, EXCISION OF PLICA;  Surgeon: Yvette Rack., MD;  Location: [facility];  Service: Orthopedics;  Laterality: Left;    LAPAROSCOPIC APPENDECTOMY N/A 04/22/2016   Procedure: APPENDECTOMY LAPAROSCOPIC;  Surgeon: Alphonsa Overall, MD;  Location: [facility];  Service: General;  Laterality: N/A;   Family History  Problem Relation Age of Onset   Alcohol abuse Mother    Hyperlipidemia Mother    Hypertension Mother    Diabetes Mother    Cancer Mother        LUNG- SMOKER    Alcohol abuse Father    Cancer Father        lung   Seizures Brother    Colon cancer Neg Hx    Breast cancer Neg Hx    Social History   Socioeconomic History   Marital status: Married    Spouse name: Not on file   Number of children: 2   Years of education: Not on file   Highest education level: Not on file  Occupational History   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     Social Determinants of Health   Financial Resource Strain: Low Risk    Difficulty of Paying Living Expenses: Not hard at all  Food Insecurity: No Food Insecurity   Worried About Charity fundraiser in the Last Year: Never true   [place] in the Last Year: Never true  Transportation Needs: No Transportation Needs   Lack of Transportation (Medical): No   Lack of Transportation (Non-Medical): No  Physical Activity: Inactive   Days of Exercise per Week: 0 days   Minutes of Exercise per Session: 0 min  Stress: No Stress Concern Present   Feeling of Stress : Only a little  Social Connections: Moderately Isolated   Frequency of Communication with Friends and Family: More than three times a week    Frequency of Social Gatherings with Friends and Family: Three times a week   Attends Religious Services: Never   Active Member of Clubs or Organizations: No   Attends Music therapist: Never   Marital Status: Married    Tobacco Counseling Ready to quit: Not Answered Counseling given: Not Answered   Clinical Intake:  Pre-visit preparation completed: Yes  Pain : No/denies pain     BMI - recorded: 25.46 Nutritional Status: BMI 25 -29 Overweight Nutritional Risks: None Diabetes: No  How often do you need to have someone help you when you read instructions, pamphlets, or other written materials from your doctor or pharmacy?: 1 - Never  Diabetic? No  Interpreter Needed?: No  Information entered by :: Orrin Brigham LPN   Activities  of Daily Living In your present state of health, do you have any difficulty performing the following activities: 04/02/2021  Hearing? Y  Comment wears hearing aids  Vision? N  Difficulty concentrating or making decisions? N  Walking or climbing stairs? N  Dressing or bathing? N  Doing errands, shopping? N  Preparing Food and eating ? N  Using the Toilet? N  In the past six months, have you accidently leaked urine? N  Do you have problems with loss of bowel control? N  Managing your Medications? N  Managing your Finances? N  Housekeeping or managing your Housekeeping? N  Some recent data might be hidden    Patient Care Team: Tonia Ghent, MD as PCP - General [place], Heather, Gordon as Referring Physician (Optometry)  Indicate any recent Medical Services you may have received from other than [facility] providers in the past year (date may be approximate).     Assessment:   This is a routine wellness examination for Breyonna.  Hearing/Vision screen Hearing Screening - Comments:: Wears hearing aids Vision Screening - Comments:: Last exam 01/2021, Dr. [place], wears readers  Dietary issues and exercise activities discussed: Current  Exercise Habits: The patient does not participate in regular exercise at present   Goals Addressed             This Visit's Progress    Patient Stated       Would like to start walking more        Depression Screen PHQ 2/9 Scores 04/02/2021 01/12/2019 01/06/2018 01/02/2017 12/19/2015 12/11/2014  PHQ - 2 Score 0 0 0 0 0 0  PHQ- 9 Score - 0 0 0 - -    Fall Risk Fall Risk  04/02/2021 08/31/2020 06/12/2020 01/12/2019 01/06/2018  Falls in the past year? 0 0 0 1 No  Comment - - - tripped on purse -  Number falls in past yr: 0 0 0 0 -  Injury with Fall? 0 0 0 0 -  Risk for fall due to : - - No Fall Risks - -  Follow up Falls prevention discussed Falls evaluation completed Falls evaluation completed Falls evaluation completed;Falls prevention discussed -    FALL RISK PREVENTION PERTAINING TO THE HOME:  Any stairs in or around the home? Yes  If so, are there any without handrails? No  Home free of loose throw rugs in walkways, pet beds, electrical cords, etc? Yes  Adequate lighting in your home to reduce risk of falls? Yes   ASSISTIVE DEVICES UTILIZED TO PREVENT FALLS:  Life alert? No  Use of a cane, walker or w/c? No  Grab bars in the bathroom? Yes  Shower chair or bench in shower? Yes  Elevated toilet seat or a handicapped toilet? Yes   TIMED UP AND GO:  Was the test performed? No .    Cognitive Function: Normal cognitive status assessed by  this Nurse Health Advisor. No abnormalities found.   MMSE - Mini Mental State Exam 01/12/2019 01/06/2018 01/02/2017 12/19/2015  Orientation to time 5 5 5 5   Orientation to Place 5 5 5 5   Registration 3 3 3 3   Attention/ Calculation 5 0 0 0  Recall 3 3 3 3   Language- name 2 objects - 0 0 0  Language- repeat 1 1 1 1   Language- follow 3 step command - 3 3 3   Language- read & follow direction - 0 0 0  Write a sentence - 0 0 0  Copy design - 0  0 0  Total score - 20 20 20         Immunizations Immunization History  Administered  Date(s) Administered   Fluad Quad(high Dose 65+) 11/19/2018   Influenza Split 12/25/2010, 12/17/2011, 12/02/2012   Influenza Whole 01/08/2010, 12/17/2011   Influenza, High Dose Seasonal PF 01/02/2017, 12/08/2017   Influenza,inj,Quad PF,6+ Mos 12/11/2014, 12/19/2015   Influenza-Unspecified 12/07/2013, 12/08/2017   PFIZER(Purple Top)SARS-COV-2 Vaccination 03/30/2019, 04/20/2019, 11/17/2019, 08/08/2020   Pfizer Covid-19 Vaccine Bivalent Booster 60yrs & up 01/04/2021   Pneumococcal Conjugate-13 12/11/2014   Pneumococcal Polysaccharide-23 12/19/2015   Td 08/24/2007   Tdap 04/28/2013   Zoster Recombinat (Shingrix) 01/18/2019, 03/22/2019    TDAP status: Up to date  Flu Vaccine status: Up to date  Pneumococcal vaccine status: Up to date  Covid-19 vaccine status: Completed vaccines  Qualifies for Shingles Vaccine? Yes   Zostavax completed No   Shingrix Completed?: Yes  Screening Tests Health Maintenance  Topic Date Due   INFLUENZA VACCINE  10/08/2020   MAMMOGRAM  01/11/2023   TETANUS/TDAP  04/29/2023   COLONOSCOPY (Pts 45-65yrs Insurance coverage will need to be confirmed)  12/20/2025   Pneumonia Vaccine 59+ Years old  Completed   DEXA SCAN  Completed   COVID-19 Vaccine  Completed   Hepatitis C Screening  Completed   Zoster Vaccines- Shingrix  Completed   HPV VACCINES  Aged Out    Health Maintenance  Health Maintenance Due  Topic Date Due   INFLUENZA VACCINE  10/08/2020    Colorectal cancer screening: Type of screening: Colonoscopy. Completed 12/20/20. Repeat every 5 years  Mammogram status: Completed 01/10/21. Repeat every year  Bone Density status: Ordered 04/02/21. Pt provided with contact info and advised to call to schedule appt.  Lung Cancer Screening: (Low Dose CT Chest recommended if Age 69-80 years, 30 pack-year currently smoking OR have quit w/in 15years.) does not qualify.     Additional Screening:  Hepatitis C Screening: does qualify; Completed  10/28/12  Vision Screening: Recommended annual ophthalmology exams for early detection of glaucoma and other disorders of the eye. Is the patient up to date with their annual eye exam?  Yes  Who is the provider or what is the name of the office in which the patient attends annual eye exams? Dr. Dallie Piles   Dental Screening: Recommended annual dental exams for proper oral hygiene  Community Resource Referral / Chronic Care Management: CRR required this visit?  No   CCM required this visit?  No      Plan:     I have personally reviewed and noted the following in the patients chart:   Medical and social history Use of alcohol, tobacco or illicit drugs  Current medications and supplements including opioid prescriptions.  Functional ability and status Nutritional status Physical activity Advanced directives List of other physicians Hospitalizations, surgeries, and ER visits in previous 12 months Vitals Screenings to include cognitive, depression, and falls Referrals and appointments  In addition, I have reviewed and discussed with patient certain preventive protocols, quality metrics, and best practice recommendations. A written personalized care plan for preventive services as well as general preventive health recommendations were provided to patient.    Due to this being a telephonic visit, the after visit summary with patients personalized plan was offered to patient via mail or my-chart.  Patient would like to access on my-chart.   Loma Messing, LPN   04/05/5168   Nurse Health Advisor  Nurse Notes: none

## 2021-04-02 ENCOUNTER — Ambulatory Visit (INDEPENDENT_AMBULATORY_CARE_PROVIDER_SITE_OTHER): Payer: Medicare PPO

## 2021-04-02 VITALS — Ht 65.0 in | Wt 153.0 lb

## 2021-04-02 DIAGNOSIS — Z780 Asymptomatic menopausal state: Secondary | ICD-10-CM | POA: Diagnosis not present

## 2021-04-02 DIAGNOSIS — Z0000 Encounter for general adult medical examination without abnormal findings: Secondary | ICD-10-CM | POA: Diagnosis not present

## 2021-04-02 NOTE — Patient Instructions (Addendum)
[name] , Thank you for taking time to complete your Medicare Wellness Visit. I appreciate your ongoing commitment to your health goals. Please review the following plan we discussed and let me know if I can assist you in the future.   Screening recommendations/referrals: Colonoscopy: up to date, completed 12/20/20, due 12/20/25 Mammogram: up to date, completed 01/10/21, due 01/10/22 Bone Density: due, last completed 01/23/15, ordered today, someone one will call to schedule an appointment Recommended yearly ophthalmology/optometry visit for glaucoma screening and checkup Recommended yearly dental visit for hygiene and checkup  Vaccinations: Influenza vaccine: up to date, please provide vaccine information for your chart  Pneumococcal vaccine: up to date Tdap vaccine: up to date Shingles vaccine: up to date   Covid-19:up to date  Advanced directives: Please bring a copy of Living Will and/or [place] for your chart.   Conditions/risks identified: see problem list   Next appointment: Follow up in one year for your annual wellness visit 04/03/22 @ 11:15am, this will be a telephone visit.    Preventive Care 72 Years and Older, Female Preventive care refers to lifestyle choices and visits with your health care provider that can promote health and wellness. What does preventive care include? A yearly physical exam. This is also called an annual well check. Dental exams once or twice a year. Routine eye exams. Ask your health care provider how often you should have your eyes checked. Personal lifestyle choices, including: Daily care of your teeth and gums. Regular physical activity. Eating a healthy diet. Avoiding tobacco and drug use. Limiting alcohol use. Practicing safe sex. Taking low-dose aspirin every day. Taking vitamin and mineral supplements as recommended by your health care provider. What happens during an annual well check? The services and  screenings done by your health care provider during your annual well check will depend on your age, overall health, lifestyle risk factors, and family history of disease. Counseling  Your health care provider may ask you questions about your: Alcohol use. Tobacco use. Drug use. Emotional well-being. Home and relationship well-being. Sexual activity. Eating habits. History of falls. Memory and ability to understand (cognition). Work and work Statistician. Reproductive health. Screening  You may have the following tests or measurements: Height, weight, and BMI. Blood pressure. Lipid and cholesterol levels. These may be checked every 5 years, or more frequently if you are over 72 years old. Skin check. Lung cancer screening. You may have this screening every year starting at age 72 if you have a 30-pack-year history of smoking and currently smoke or have quit within the past 15 years. Fecal occult blood test (FOBT) of the stool. You may have this test every year starting at age 72. Flexible sigmoidoscopy or colonoscopy. You may have a sigmoidoscopy every 5 years or a colonoscopy every 10 years starting at age 72. Hepatitis C blood test. Hepatitis B blood test. Sexually transmitted disease (STD) testing. Diabetes screening. This is done by checking your blood sugar (glucose) after you have not eaten for a while (fasting). You may have this done every 1-3 years. Bone density scan. This is done to screen for osteoporosis. You may have this done starting at age 72. Mammogram. This may be done every 1-2 years. Talk to your health care provider about how often you should have regular mammograms. Talk with your health care provider about your test results, treatment options, and if necessary, the need for more tests. Vaccines  Your health care provider may recommend certain vaccines, such  as: Influenza vaccine. This is recommended every year. Tetanus, diphtheria, and acellular pertussis (Tdap,  Td) vaccine. You may need a Td booster every 10 years. Zoster vaccine. You may need this after age 35. Pneumococcal 13-valent conjugate (PCV13) vaccine. One dose is recommended after age 26. Pneumococcal polysaccharide (PPSV23) vaccine. One dose is recommended after age 8. Talk to your health care provider about which screenings and vaccines you need and how often you need them. This information is not intended to replace advice given to you by your health care provider. Make sure you discuss any questions you have with your health care provider. Document Released: 03/23/2015 Document Revised: 11/14/2015 Document Reviewed: 12/26/2014 Elsevier Interactive Patient Education  2017 [place] Prevention in the Home Falls can cause injuries. They can happen to people of all ages. There are many things you can do to make your home safe and to help prevent falls. What can I do on the outside of my home? Regularly fix the edges of walkways and driveways and fix any cracks. Remove anything that might make you trip as you walk through a door, such as a raised step or threshold. Trim any bushes or trees on the path to your home. Use bright outdoor lighting. Clear any walking paths of anything that might make someone trip, such as rocks or tools. Regularly check to see if handrails are loose or broken. Make sure that both sides of any steps have handrails. Any raised decks and porches should have guardrails on the edges. Have any leaves, snow, or ice cleared regularly. Use sand or salt on walking paths during winter. Clean up any spills in your garage right away. This includes oil or grease spills. What can I do in the bathroom? Use night lights. Install grab bars by the toilet and in the tub and shower. Do not use towel bars as grab bars. Use non-skid mats or decals in the tub or shower. If you need to sit down in the shower, use a plastic, non-slip stool. Keep the floor dry. Clean up any  water that spills on the floor as soon as it happens. Remove soap buildup in the tub or shower regularly. Attach bath mats securely with double-sided non-slip rug tape. Do not have throw rugs and other things on the floor that can make you trip. What can I do in the bedroom? Use night lights. Make sure that you have a light by your bed that is easy to reach. Do not use any sheets or blankets that are too big for your bed. They should not hang down onto the floor. Have a firm chair that has side arms. You can use this for support while you get dressed. Do not have throw rugs and other things on the floor that can make you trip. What can I do in the kitchen? Clean up any spills right away. Avoid walking on wet floors. Keep items that you use a lot in easy-to-reach places. If you need to reach something above you, use a strong step stool that has a grab bar. Keep electrical cords out of the way. Do not use floor polish or wax that makes floors slippery. If you must use wax, use non-skid floor wax. Do not have throw rugs and other things on the floor that can make you trip. What can I do with my stairs? Do not leave any items on the stairs. Make sure that there are handrails on both sides of the stairs and use  them. Fix handrails that are broken or loose. Make sure that handrails are as long as the stairways. Check any carpeting to make sure that it is firmly attached to the stairs. Fix any carpet that is loose or worn. Avoid having throw rugs at the top or bottom of the stairs. If you do have throw rugs, attach them to the floor with carpet tape. Make sure that you have a light switch at the top of the stairs and the bottom of the stairs. If you do not have them, ask someone to add them for you. What else can I do to help prevent falls? Wear shoes that: Do not have high heels. Have rubber bottoms. Are comfortable and fit you well. Are closed at the toe. Do not wear sandals. If you use a  stepladder: Make sure that it is fully opened. Do not climb a closed stepladder. Make sure that both sides of the stepladder are locked into place. Ask someone to hold it for you, if possible. Clearly mark and make sure that you can see: Any grab bars or handrails. First and last steps. Where the edge of each step is. Use tools that help you move around (mobility aids) if they are needed. These include: Canes. Walkers. Scooters. Crutches. Turn on the lights when you go into a dark area. Replace any light bulbs as soon as they burn out. Set up your furniture so you have a clear path. Avoid moving your furniture around. If any of your floors are uneven, fix them. If there are any pets around you, be aware of where they are. Review your medicines with your doctor. Some medicines can make you feel dizzy. This can increase your chance of falling. Ask your doctor what other things that you can do to help prevent falls. This information is not intended to replace advice given to you by your health care provider. Make sure you discuss any questions you have with your health care provider. Document Released: 12/21/2008 Document Revised: 08/02/2015 Document Reviewed: 03/31/2014 Elsevier Interactive Patient Education  2017 [employer].

## 2021-04-08 ENCOUNTER — Other Ambulatory Visit (INDEPENDENT_AMBULATORY_CARE_PROVIDER_SITE_OTHER): Payer: Medicare PPO

## 2021-04-08 ENCOUNTER — Other Ambulatory Visit: Payer: Self-pay

## 2021-04-08 DIAGNOSIS — E785 Hyperlipidemia, unspecified: Secondary | ICD-10-CM | POA: Diagnosis not present

## 2021-04-08 LAB — LIPID PANEL
Cholesterol: 152 mg/dL (ref 0–200)
HDL: 45.2 mg/dL (ref >39.00)
LDL Cholesterol: 87 mg/dL (ref 0–99)
NonHDL: 106.72
Total CHOL/HDL Ratio: 3
Triglycerides: 101 mg/dL (ref 0.0–149.0)
VLDL: 20.2 mg/dL (ref 0.0–40.0)

## 2021-04-08 LAB — COMPREHENSIVE METABOLIC PANEL
ALT: 14 U/L (ref 0–35)
AST: 17 U/L (ref 0–37)
Albumin: 4.2 g/dL (ref 3.5–5.2)
Alkaline Phosphatase: 86 U/L (ref 39–117)
BUN: 13 mg/dL (ref 6–23)
CO2: 28 mEq/L (ref 19–32)
Calcium: 9.3 mg/dL (ref 8.4–10.5)
Chloride: 104 mEq/L (ref 96–112)
Creatinine, Ser: 0.84 mg/dL (ref 0.40–1.20)
GFR: 69.86 mL/min (ref >60.00)
Glucose, Bld: 84 mg/dL (ref 70–99)
Potassium: 4.1 mEq/L (ref 3.5–5.1)
Sodium: 140 mEq/L (ref 135–145)
Total Bilirubin: 0.5 mg/dL (ref 0.2–1.2)
Total Protein: 6.4 g/dL (ref 6.0–8.3)

## 2021-04-15 ENCOUNTER — Other Ambulatory Visit: Payer: Self-pay

## 2021-04-15 ENCOUNTER — Ambulatory Visit (INDEPENDENT_AMBULATORY_CARE_PROVIDER_SITE_OTHER): Payer: Medicare PPO | Admitting: Family Medicine

## 2021-04-15 ENCOUNTER — Encounter: Payer: Self-pay | Admitting: Family Medicine

## 2021-04-15 VITALS — BP 122/72 | HR 78 | Temp 97.9°F | Ht 65.0 in | Wt 154.0 lb

## 2021-04-15 DIAGNOSIS — I517 Cardiomegaly: Secondary | ICD-10-CM | POA: Diagnosis not present

## 2021-04-15 DIAGNOSIS — L930 Discoid lupus erythematosus: Secondary | ICD-10-CM

## 2021-04-15 DIAGNOSIS — Z780 Asymptomatic menopausal state: Secondary | ICD-10-CM

## 2021-04-15 DIAGNOSIS — E785 Hyperlipidemia, unspecified: Secondary | ICD-10-CM

## 2021-04-15 DIAGNOSIS — F17200 Nicotine dependence, unspecified, uncomplicated: Secondary | ICD-10-CM

## 2021-04-15 DIAGNOSIS — Z0000 Encounter for general adult medical examination without abnormal findings: Secondary | ICD-10-CM

## 2021-04-15 DIAGNOSIS — Z7189 Other specified counseling: Secondary | ICD-10-CM

## 2021-04-15 NOTE — Progress Notes (Signed)
This visit occurred during the SARS-CoV-2 public health emergency.  Safety protocols were in place, including screening questions prior to the visit, additional usage of staff PPE, and extensive cleaning of exam room while observing appropriate contact time as indicated for disinfecting solutions.  Tetanus 2015 Flu 2022 PNA up to date.  Shingles d/w pt.   Covid vaccine prev done.   Colonoscopy 2022 Breast cancer screening 2022 Pap not due.   DXA 2016, wnl.  d/w pt.  ordered 2023.   Advance directive- husband designated if patient were incapacitated.  She brought her living will/advance directive into the clinic for scanning 2023.  Lupus per outside clinic.  Still on plaquenil at baseline.  I will defer.  She agrees.  H/o pulmonary nodules with adequate f/u prev.  D/w pt about starting lung cancer screening program.  Smoking d/w pt.  She'll update me as needed, if I can be of service with smoking cessation.  H/o HLD.  D/w pt about diet and exercise and labs.  TG improved now.   Labs discussed with patient  Her son is recently separated and that is a noted stressor.  Her husband is caring for his parents.  D/w pt.    She had cardiology f/u re: LVH.  She had genetic appointment and has cards f/u pending.  No CP, SOB, BLE edema.    Meds, vitals, and allergies reviewed.   ROS: Per HPI unless specifically indicated in ROS section   GEN: nad, alert and oriented HEENT: ncat NECK: supple w/o LA CV: rrr. PULM: ctab, no inc wob ABD: soft, +bs EXT: no edema SKIN: no acute rash  33 minutes were devoted to patient care in this encounter (this includes time spent reviewing the patient's file/history, interviewing and examining the patient, counseling/reviewing plan with patient).

## 2021-04-15 NOTE — Patient Instructions (Signed)
Your labs look good.  We'll call about the lung screening and you should get a call about the bone density test.  Take care.  Glad to see you.

## 2021-04-18 NOTE — Assessment & Plan Note (Signed)
Per outside clinic.  I will defer. 

## 2021-04-18 NOTE — Assessment & Plan Note (Signed)
History of.  Lipids are improved.  Continue work on diet and exercise.

## 2021-04-18 NOTE — Assessment & Plan Note (Signed)
Advance directive- husband designated if patient were incapacitated.  

## 2021-04-18 NOTE — Assessment & Plan Note (Signed)
Discussed previous imaging and smoking cessation.  Discussed lung cancer screening program.  Referral placed.

## 2021-04-18 NOTE — Assessment & Plan Note (Signed)
Tetanus 2015 Flu 2022 PNA up to date.  Shingles d/w pt.   Covid vaccine prev done.   Colonoscopy 2022 Breast cancer screening 2022 Pap not due.   DXA 2016, wnl.  d/w pt.  ordered 2023.   Advance directive- husband designated if patient were incapacitated.  She brought her living will/advance directive into the clinic for scanning 2023.

## 2021-04-18 NOTE — Assessment & Plan Note (Signed)
°  She had cardiology f/u re: LVH.  She had genetic appointment and has cards f/u pending.  No CP, SOB, BLE edema.  Since her blood pressure is normal I will defer any medication changes to cardiology.  Pathophysiology of LVH discussed with patient.

## 2021-05-01 DIAGNOSIS — Z79899 Other long term (current) drug therapy: Secondary | ICD-10-CM | POA: Diagnosis not present

## 2021-05-03 ENCOUNTER — Other Ambulatory Visit: Payer: Self-pay | Admitting: *Deleted

## 2021-05-03 DIAGNOSIS — F17210 Nicotine dependence, cigarettes, uncomplicated: Secondary | ICD-10-CM

## 2021-05-03 DIAGNOSIS — Z87891 Personal history of nicotine dependence: Secondary | ICD-10-CM

## 2021-05-15 ENCOUNTER — Other Ambulatory Visit: Payer: Self-pay

## 2021-05-15 ENCOUNTER — Encounter: Payer: Self-pay | Admitting: Acute Care

## 2021-05-15 ENCOUNTER — Ambulatory Visit (INDEPENDENT_AMBULATORY_CARE_PROVIDER_SITE_OTHER): Payer: Medicare PPO | Admitting: Acute Care

## 2021-05-15 DIAGNOSIS — F17210 Nicotine dependence, cigarettes, uncomplicated: Secondary | ICD-10-CM | POA: Diagnosis not present

## 2021-05-15 NOTE — Patient Instructions (Signed)
Thank you for participating in the [facility] Cancer Screening Program. °It was our pleasure to meet you today. °We will call you with the results of your scan within the next few days. °Your scan will be assigned a Lung RADS category score by the physicians reading the scans.  °This Lung RADS score determines follow up scanning.  °See below for description of categories, and follow up screening recommendations. °We will be in touch to schedule your follow up screening annually or based on recommendations of our providers. °We will fax a copy of your scan results to your Primary Care Physician, or the physician who referred you to the program, to ensure they have the results. °Please call the office if you have any questions or concerns regarding your scanning experience or results.  °Our office number is [phone]. °Please speak with Denise Phelps, RN. She is our Lung Cancer Screening RN. °If she is unavailable when you call, please have the office staff send her a message. She will return your call at her earliest convenience. °Remember, if your scan is normal, we will scan you annually as long as you continue to meet the criteria for the program. (Age 55-77, Current smoker or smoker who has quit within the last 15 years). °If you are a smoker, remember, quitting is the single most powerful action that you can take to decrease your risk of lung cancer and other pulmonary, breathing related problems. °We know quitting is hard, and we are here to help.  °Please let us know if there is anything we can do to help you meet your goal of quitting. °If you are a former smoker, congratulations. We are proud of you! Remain smoke free! °Remember you can refer friends or family members through the number above.  °We will screen them to make sure they meet criteria for the program. °Thank you for helping us take better care of you by participating in Lung Screening. ° °You can receive free nicotine replacement therapy  ( patches, gum or mints) by calling 1-800-QUIT NOW. Please call so we can get you on the path to becoming  a non-smoker. I know it is hard, but you can do this! ° °Lung RADS Categories: ° °Lung RADS 1: no nodules or definitely non-concerning nodules.  °Recommendation is for a repeat annual scan in 12 months. ° °Lung RADS 2:  nodules that are non-concerning in appearance and behavior with a very low likelihood of becoming an active cancer. °Recommendation is for a repeat annual scan in 12 months. ° °Lung RADS 3: nodules that are probably non-concerning , includes nodules with a low likelihood of becoming an active cancer.  Recommendation is for a 6-month repeat screening scan. Often noted after an upper respiratory illness. We will be in touch to make sure you have no questions, and to schedule your 6-month scan. ° °Lung RADS 4 A: nodules with concerning findings, recommendation is most often for a follow up scan in 3 months or additional testing based on our provider's assessment of the scan. We will be in touch to make sure you have no questions and to schedule the recommended 3 month follow up scan. ° °Lung RADS 4 B:  indicates findings that are concerning. We will be in touch with you to schedule additional diagnostic testing based on our provider's  assessment of the scan. ° °Hypnosis for smoking cessation  °[employer] °[phone] ° °Acupuncture for smoking cessation  °[facility] °[phone]  °

## 2021-05-15 NOTE — Progress Notes (Signed)
Virtual Visit via Telephone Note ? ?I connected with Renelda Loma on 05/15/21 at  9:30 AM EST by telephone and verified that I am speaking with the correct person using two identifiers. ? ?Location: ?Patient: At home ?Provider:  [place], [place], [place], [address]  ?  ?I discussed the limitations, risks, security and privacy concerns of performing an evaluation and management service by telephone and the availability of in person appointments. I also discussed with the patient that there may be a patient responsible charge related to this service. The patient expressed understanding and agreed to proceed. ? ? ? ?Shared Decision Making Visit Lung Cancer Screening Program ?([phone]) ? ? ?Eligibility: ?Age 37 y.o. ?Pack Years Smoking History Calculation 75 pack year smoking history ?(# packs/per year x # years smoked) ?Recent History of coughing up blood  no ?Unexplained weight loss? no ?( >Than 15 pounds within the last 6 months ) ?Prior History Lung / other cancer no ?(Diagnosis within the last 5 years already requiring surveillance chest CT Scans). ?Smoking Status Current Smoker ?Former Smokers: Years since quit:  NA ? Quit Date:  NA ? ?Visit Components: ?Discussion included one or more decision making aids. yes ?Discussion included risk/benefits of screening. yes ?Discussion included potential follow up diagnostic testing for abnormal scans. yes ?Discussion included meaning and risk of over diagnosis. yes ?Discussion included meaning and risk of False Positives. yes ?Discussion included meaning of total radiation exposure. yes ? ?Counseling Included: ?Importance of adherence to annual lung cancer LDCT screening. yes ?Impact of comorbidities on ability to participate in the program. yes ?Ability and willingness to under diagnostic treatment. yes ? ?Smoking Cessation Counseling: ?Current Smokers:  ?Discussed importance of smoking cessation. yes ?Information about tobacco cessation classes and  interventions provided to patient. yes ?Patient provided with "ticket" for LDCT Scan. yes ?Symptomatic Patient. no ? Counseling NA ?Diagnosis Code: Tobacco Use Z72.0 ?Asymptomatic Patient yes ? Counseling (Intermediate counseling: > three minutes counseling) H0865 ?Former Smokers:  ?Discussed the importance of maintaining cigarette abstinence. yes ?Diagnosis Code: Personal History of Nicotine Dependence. H84.696 ?Information about tobacco cessation classes and interventions provided to patient. Yes ?Patient provided with "ticket" for LDCT Scan. yes ?Written Order for Lung Cancer Screening with LDCT placed in Epic. Yes ?(CT Chest Lung Cancer Screening Low Dose W/O CM) [id number] ?Z12.2-Screening of respiratory organs ?Z87.891-Personal history of nicotine dependence ? ?I have spent 25 minutes of face to face/ virtual visit   time with  Ms. Patchen discussing the risks and benefits of lung cancer screening. We viewed / discussed a power point together that explained in detail the above noted topics. We paused at intervals to allow for questions to be asked and answered to ensure understanding.We discussed that the single most powerful action that she can take to decrease her risk of developing lung cancer is to quit smoking. We discussed whether or not she is ready to commit to setting a quit date. We discussed options for tools to aid in quitting smoking including nicotine replacement therapy, non-nicotine medications, support groups, Quit Smart classes, and behavior modification. We discussed that often times setting smaller, more achievable goals, such as eliminating 1 cigarette a day for a week and then 2 cigarettes a day for a week can be helpful in slowly decreasing the number of cigarettes smoked. This allows for a sense of accomplishment as well as providing a clinical benefit. I provided  her  with smoking cessation  information  with contact information for community resources,  classes, free nicotine replacement  therapy, and access to mobile apps, text messaging, and on-line smoking cessation help. I have also provided  her  the office contact information in the event she needs to contact me, or the screening staff. We discussed the time and location of the scan, and that either Doroteo Glassman RN, Joella Prince, RN  or I will call / send a letter with the results within 24-72 hours of receiving them. The patient verbalized understanding of all of  the above and had no further questions upon leaving the office. They have my contact information in the event they have any further questions. ? ?I spent 3 minutes counseling on smoking cessation and the health risks of continued tobacco abuse. ? ?I explained to the patient that there has been a high incidence of coronary artery disease noted on these exams. I explained that this is a non-gated exam therefore degree or severity cannot be determined. This patient is not on statin therapy. I have asked the patient to follow-up with their PCP regarding any incidental finding of coronary artery disease and management with diet or medication as their PCP  feels is clinically indicated. The patient verbalized understanding of the above and had no further questions upon completion of the visit. ? ?  ? ? ?Magdalen Spatz, NP ?05/15/2021 ? ? ? ? ? ? ?

## 2021-05-16 ENCOUNTER — Ambulatory Visit (INDEPENDENT_AMBULATORY_CARE_PROVIDER_SITE_OTHER)
Admission: RE | Admit: 2021-05-16 | Discharge: 2021-05-16 | Disposition: A | Discharge status: Home / Self Care | Payer: Medicare PPO | Source: Ambulatory Visit | Attending: Acute Care | Admitting: Acute Care

## 2021-05-16 ENCOUNTER — Other Ambulatory Visit: Payer: Self-pay

## 2021-05-16 DIAGNOSIS — F17210 Nicotine dependence, cigarettes, uncomplicated: Secondary | ICD-10-CM

## 2021-05-16 DIAGNOSIS — Z87891 Personal history of nicotine dependence: Secondary | ICD-10-CM | POA: Diagnosis not present

## 2021-05-17 ENCOUNTER — Telehealth: Payer: Self-pay | Admitting: Acute Care

## 2021-05-17 DIAGNOSIS — Z87891 Personal history of nicotine dependence: Secondary | ICD-10-CM

## 2021-05-17 DIAGNOSIS — F17210 Nicotine dependence, cigarettes, uncomplicated: Secondary | ICD-10-CM

## 2021-05-17 DIAGNOSIS — R911 Solitary pulmonary nodule: Secondary | ICD-10-CM

## 2021-05-17 NOTE — Telephone Encounter (Signed)
Call report on LDCT done yesterday ? ?Impression: ? ?IMPRESSION: ?1. New nodular area of architectural distortion in the medial aspect ?of the right lower lobe. This could be infectious or inflammatory in ?etiology, but is categorized as Lung-RADS 4A, suspicious. Follow up ?low-dose chest CT without contrast in 3 months (please use the ?following order, "CT CHEST LCS NODULE FOLLOW-UP W/O CM") is ?recommended. Alternatively, PET may be considered when there is a ?solid component 78m or larger. ?2. The "S" modifier above refers to potentially clinically ?significant non lung cancer related findings. Specifically, there is ?aortic atherosclerosis, in addition to left main and 2 vessel ?coronary artery disease. Assessment for potential risk factor ?modification, dietary therapy or pharmacologic therapy may be ?warranted, if clinically indicated. ?3. Mild diffuse bronchial wall thickening with mild centrilobular ?and paraseptal emphysema; imaging findings suggestive of underlying ?COPD. ?4. There are calcifications of the aortic valve. Echocardiographic ?correlation for evaluation of potential valvular dysfunction may be ?warranted if clinically indicated. ?  ?These results will be called to the ordering clinician or ?representative by the Radiologist Assistant, and communication ?documented in the PACS or [employer] ?  ?Aortic Atherosclerosis (ICD10-I70.0) and Emphysema (ICD10-J43.9). ?  ?  ?Electronically Signed ?  By: DVinnie LangtonM.D. ?  On: 05/17/2021 09:10 ?

## 2021-05-17 NOTE — Telephone Encounter (Signed)
Ashlee Allen, this patient has a new 77m nodule .  ?

## 2021-05-17 NOTE — Telephone Encounter (Signed)
I have called the patient with the results of her low dose CT Chest. Her scan was read aS A Lung RADS 4 A : suspicious findings, either short term follow up in 3 months or alternatively  PET Scan evaluation may be considered when there is a solid component of  8 mm or larger. There is a new nodular area of architectural distortion in the medial aspect of the right lower lobe. This could be infectious or inflammatory in ?etiology, but is categorized as Lung-RADS 4A. It has a volume derived mean diameter of 14.8 mm. She states she has been sick with bronchitis, so ? If this could be infectious / inflammatory. ?I explained we will do a 3 month follow up LDCT to better evaluate it. She is in agreement with this plan.  ?Langley Gauss , please fax results to PCP and place order for 3 month follow up low dose CT Chest.  ?Thanks so much ?

## 2021-05-20 NOTE — Telephone Encounter (Signed)
CT results faxed to PCP with follow up plans included. Order placed for 3 mth nodule f/u CT.  ?

## 2021-05-23 ENCOUNTER — Other Ambulatory Visit: Payer: Self-pay

## 2021-05-23 ENCOUNTER — Encounter: Payer: Medicare PPO | Admitting: *Deleted

## 2021-05-23 NOTE — Research (Signed)
Title: NIGHTINGALE: CliNIcal Utility of ManaGement of Patients witH CT and LDCT Identified Pulmonary Nodules UsinG the Percepta NasAL Swab ClassifiEr -- with Familiarization ?  ?Protocol #: [id number] Sponsor: [employer] ?  ?Protocol Revision 1 dated 01Sep2022 and confirmed current on today's visit, IRB approved Revision 1 on 29Dec2022. ?  ?Objectives:  ?Primary: To evaluate if use of the Percepta Nasal Swab test in the diagnostic work up ?of newly identified pulmonary nodules reduces the number of invasive procedures in the ?group classified as low-risk by the test and that are benign as compared to a control ?group managed without a Percepta Nasal Swab test result. ?                  A newly identified nodule is defined as any nodule first identified on imaging ?                  <90 days prior to nasal sample collection that hasn?t undergone a diagnostic ?                   procedure for the management of their index nodule prior to enrollment. ?                  CT imaging includes conventional CT, LDCT, HRCT ?                  Benign diagnosis is defined as a specific diagnosis of a benign condition, ?                   radiographic resolution or stability at ? 24 months, or no cytological, ?                   radiological, or pathological evidence of cancer. ?                  Procedures will be categorized as either invasive or non-invasive in the Data ?                   Management Plan (DMP). ?Secondary: To evaluate if use of the Percepta Nasal Swab test in the diagnostic work ?up of newly identified pulmonary nodules increases the proportion of subjects classified ?as high-risk by the test and have primary lung cancer that go directly to appropriate ?therapy as compared to a control group managed without a Percepta Nasal Swab test ?result. ?                   Proportion of subjects that go directly to appropriate therapy is defined as those ?                    subjects that undergo surgery, ablative  or other appropriate therapy as the next ?                    step after the Percepta Nasal Swab test result without intervening non-surgical ?                    procedures ?                            a. Non-surgical procedures include diagnostic PET, but not PET for ?                                  staging purposes. ?                            b. Appropriate therapies will be defined in the CRF. ?                   A newly identified nodule is defined as any nodule first identified on imaging ?                   <90 days prior to nasal sample collection. ?                   Lung cancer diagnosis is defined as established by cytology or pathology, or in ?                    circumstances where a presumptive diagnosis of cancer led to definitive ?                    ablative or other appropriate therapy without pathology. ?Key Inclusion Criteria: ? Inclusion Criteria: ? Able to tolerate nasal epithelial specimen collection ? Signed written Informed Consent obtained ? Subject clinical history available for review by sponsor and regulatory agencies ? New nodule first identified on imaging < 90 days prior to nasal sample collection ?(index nodule) ? CT report available for index nodule ? 36 - 21 years of age ? Current or former smoker (>100 cigarettes in a lifetime) ? Pulmonary nodule ?30 mm detected by CT ? ?Key Exclusion Criteria: ?Exclusion Criteria ? Subject has undergone a diagnostic procedure for the management of their ?index nodule after the index CT and prior to enrollment ? Active cancer (other than non-melanoma skin cancer) ? Prior primary lung cancer (prior non-lung cancer acceptable) ? Prior participation in this study (i.e., subjects may not be enrolled more than ?once) ? Current active treatment with an investigational device or drug (patients in trial ?follow up period are okay if intervention phase is complete) ? Patient enrolled or planned to be enrolled in another clinical trial that may ?influence  management of the patient?s nodule ? Concurrent or planned use of tools or tests for assigning lung nodule risk of ?malignancy (e.g., genomic or proteomic blood tests) other than clinically ?validated risk calculators ? ?Clinical Research Coordinator / Research RN note : This visit is for enrollment /baseline Subject [id number] with DOB: [id number] on [id number] for the above protocol is an Enrollment Visit and is for purpose of research.  ?  ?Subject expressed interest and consent in continuing as a study subject. Subject confirmed contact information (e.g. address, telephone, email). Subject thanked for participation in research and contribution to science.   ?  ?During this visit on 16Mar2023  , the subject reviewed and signed the consent form, provided demographics, and had a nasal swab collected per the above referenced protocol. Please refer to the subject's paper source binder for further details.  ? ?PI, Dr. Valeta Harms, was present for consenting process. ?     ?  ?Signed by ?Jaye Beagle RN, CRN II ? ?

## 2021-05-31 DIAGNOSIS — L309 Dermatitis, unspecified: Secondary | ICD-10-CM | POA: Diagnosis not present

## 2021-05-31 DIAGNOSIS — L821 Other seborrheic keratosis: Secondary | ICD-10-CM | POA: Diagnosis not present

## 2021-05-31 DIAGNOSIS — Z79899 Other long term (current) drug therapy: Secondary | ICD-10-CM | POA: Diagnosis not present

## 2021-05-31 DIAGNOSIS — L814 Other melanin hyperpigmentation: Secondary | ICD-10-CM | POA: Diagnosis not present

## 2021-05-31 DIAGNOSIS — L930 Discoid lupus erythematosus: Secondary | ICD-10-CM | POA: Diagnosis not present

## 2021-06-07 DIAGNOSIS — H9212 Otorrhea, left ear: Secondary | ICD-10-CM | POA: Diagnosis not present

## 2021-06-07 DIAGNOSIS — H900 Conductive hearing loss, bilateral: Secondary | ICD-10-CM | POA: Diagnosis not present

## 2021-06-07 DIAGNOSIS — H7293 Unspecified perforation of tympanic membrane, bilateral: Secondary | ICD-10-CM | POA: Diagnosis not present

## 2021-06-17 ENCOUNTER — Other Ambulatory Visit: Payer: Medicare PPO

## 2021-07-22 ENCOUNTER — Telehealth: Payer: Self-pay | Admitting: Cardiology

## 2021-07-22 NOTE — Telephone Encounter (Signed)
Spoke with pt, she reports her blood pressure usually runs 110-115 and recently it has been elevated. She has swelling by the end of the day but just a little puffiness in the morning. Dr hochrein had told her after her test results, if she noticed her blood pressure being elevated or swelling to make sure and let him know. She does not feel this is urgent and was scheduled at the next available slot with dr hochrein. Patient voiced understanding to call the office prior to that appointment if symptoms change or worsen. ?

## 2021-07-22 NOTE — Telephone Encounter (Signed)
Pt c/o BP issue: STAT if pt c/o blurred vision, one-sided weakness or slurred speech ? ?1. What are your last 5 BP readings? Today 151/78, Sat 134/74    132/77 ? ?2. Are you having any other symptoms (ex. Dizziness, headache, blurred vision, passed out)? Swelling ankles and felt lightheaded yesterday and Saturday ? ?3. What is your BP issue? These are high blood pressure readings for her . She said her top number most of the time is 110-patient wanted  appointment ?

## 2021-08-04 NOTE — Progress Notes (Unsigned)
Cardiology Office Note   Date:  08/06/2021   ID:  BANESSA MAO, DOB 30-Jun-1950, MRN [id number]  PCP:  Tonia Ghent, MD  Cardiologist:   None Referring:  Tonia Ghent, MD  Chief Complaint  Patient presents with   Dizziness      History of Present Illness: Ashlee Allen is a 71 y.o. female who presents for evaluation of severe LVH.  She is referred by Tonia Ghent, MD .  She has no past cardiac history other than a murmur.  She had an echo with a calcified aortic valve.  She had an echo that demonstrated both of these findings. MRI demonstrated severe concentric left ventricular hypertrophy.  There is an 18 mm basal septum.  There was no evidence of cardiac amyloid.  There was no late gadolinium enhancement.    She did see our geneticist but there was no suggestion that she needed genetic testing.  It was suggested that her kids get echocardiograms.  She called because she was having some dizziness.  Her blood pressure was in the 140s and she became anxious.  However, she subsequently had some drainage in her ear and she started taking some of her prescribed eardrops (do not know the name) and this resolved.  Her dizziness resolved.  Her blood pressures been in the 270J systolic for the most part.  She is otherwise been feeling well. The patient denies any new symptoms such as chest discomfort, neck or arm discomfort. There has been no new shortness of breath, PND or orthopnea. There have been no reported palpitations, presyncope or syncope.    Past Medical History:  Diagnosis Date   Cardiac murmur    Discoid lupus    Dr. Kalman Shan (prev seen at [facility]) and Dr. Ubaldo Glassing   Fibroid    GERD (gastroesophageal reflux disease)    controllled with weight loss    Hyperlipidemia    Pulmonary nodule     Past Surgical History:  Procedure Laterality Date   ABDOMINAL HYSTERECTOMY  1997   [facility]  [facility]   BREAST SURGERY  2001   Duct excised-rt   CATARACT EXTRACTION, BILATERAL      CHOLECYSTECTOMY  1991   COLONOSCOPY     HAND SURGERY  2001   ctr-both   INNER EAR SURGERY     left- mastoidectomy   KNEE ARTHROSCOPY Left 05/20/2013   Procedure: LEFT KNEE ARTHROSCOPY WITH DEBRIDEMENT/SHAVING (CHONDROPLASTY) AND LATERAL AND MEDIAL MENISECTOMY, EXCISION OF PLICA;  Surgeon: Yvette Rack., MD;  Location: [place];  Service: Orthopedics;  Laterality: Left;   LAPAROSCOPIC APPENDECTOMY N/A 04/22/2016   Procedure: APPENDECTOMY LAPAROSCOPIC;  Surgeon: Alphonsa Overall, MD;  Location: [facility];  Service: General;  Laterality: N/A;     Current Outpatient Medications  Medication Sig Dispense Refill   calcium carbonate (TUMS - DOSED IN MG ELEMENTAL CALCIUM) 500 MG chewable tablet Chew 1 tablet by mouth as needed for indigestion or heartburn.     hydroxychloroquine (PLAQUENIL) 200 MG tablet 1-2 tabs per day (wintertime 1 tab a day, summertime 2 tabs a day)     triamcinolone cream (KENALOG) 0.1 % Apply 1 application topically 2 (two) times daily. As Needed     No current facility-administered medications for this visit.    Allergies:   Latex, Lipitor [atorvastatin calcium], and Varenicline tartrate   ROS:  Please see the history of present illness.   Otherwise, review of systems are positive for none.   All  other systems are reviewed and negative.    PHYSICAL EXAM: VS:  BP 124/62 (BP Location: Left Arm, Patient Position: Sitting, Cuff Size: Normal)   Pulse 76   Ht '5\' 5"'$  (1.651 m)   Wt 153 lb (69.4 kg)   BMI 25.46 kg/m  , BMI Body mass index is 25.46 kg/m. GENERAL:  Well appearing NECK:  No jugular venous distention, waveform within normal limits, carotid upstroke brisk and symmetric, no bruits, no thyromegaly LUNGS:  Clear to auscultation bilaterally CHEST:  Unremarkable HEART:  PMI not displaced or sustained,S1 and S2 within normal limits, no S3, no S4, no clicks, no rubs, 2 out of 6 apical systolic murmur radiating slightly at aortic outflow tract, no  diastolic murmurs ABD:  Flat, positive bowel sounds normal in frequency in pitch, no bruits, no rebound, no guarding, no midline pulsatile mass, no hepatomegaly, no splenomegaly EXT:  2 plus pulses throughout, no edema, no cyanosis no clubbing   EKG:  EKG is  ordered today. Sinus rhythm, rate 76, right bundle branch block, left anterior fascicular block   Recent Labs: 11/07/2020: Hemoglobin 14.5; Platelets 180 04/08/2021: ALT 14; BUN 13; Creatinine, Ser 0.84; Potassium 4.1; Sodium 140    Lipid Panel    Component Value Date/Time   CHOL 152 04/08/2021 0742   TRIG 101.0 04/08/2021 0742   HDL 45.20 04/08/2021 0742   CHOLHDL 3 04/08/2021 0742   VLDL 20.2 04/08/2021 0742   LDLCALC 87 04/08/2021 0742   LDLDIRECT 96.0 01/11/2019 0756      Wt Readings from Last 3 Encounters:  08/06/21 153 lb (69.4 kg)  04/15/21 154 lb (69.9 kg)  04/02/21 153 lb (69.4 kg)      Other studies Reviewed: Additional studies/ records that were reviewed today include: None Review of the above records demonstrates:    ASSESSMENT AND PLAN:  LVH:   MRI does demonstrate severe LVH but without evidence of amyloid.  At this point she has no obstructive symptoms.  She has had genetic screening as above.  She has no high risk features for sudden cardiac death.  She is not having any symptoms.  I will follow this with an echo in 1 year.   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  She does let me know if she should ever develop presyncope or syncope or other bradycardic symptoms.  TOBACCO:    We talked about the need to stop smoking.  She will work on this.  AS: This has been mild.  I will follow-up as above.  DIZZINESS: She was added to my schedule for evaluation of this.  However, this resolved with treatment of some drainage from her ear.  No change in therapy.    Current medicines are reviewed at length with the patient today.  The patient does not have concerns regarding  medicines.  The following changes have been made:  None  Labs/ tests ordered today include:   Orders Placed This Encounter  Procedures   EKG 12-Lead   ECHOCARDIOGRAM COMPLETE       Disposition:   FU with me in after the echo in one inclined it twice a day I had thank if we can year.     Signed, Minus Breeding, MD  08/06/2021 3:39 PM    [facility]

## 2021-08-06 ENCOUNTER — Encounter: Payer: Self-pay | Admitting: Cardiology

## 2021-08-06 ENCOUNTER — Ambulatory Visit: Payer: Medicare PPO | Admitting: Cardiology

## 2021-08-06 VITALS — BP 124/62 | HR 76 | Ht 65.0 in | Wt 153.0 lb

## 2021-08-06 DIAGNOSIS — Z720 Tobacco use: Secondary | ICD-10-CM

## 2021-08-06 DIAGNOSIS — E785 Hyperlipidemia, unspecified: Secondary | ICD-10-CM | POA: Diagnosis not present

## 2021-08-06 DIAGNOSIS — I517 Cardiomegaly: Secondary | ICD-10-CM | POA: Diagnosis not present

## 2021-08-06 NOTE — Patient Instructions (Signed)
Medication Instructions:  Your physician recommends that you continue on your current medications as directed. Please refer to the Current Medication list given to you today.  *If you need a refill on your cardiac medications before your next appointment, please call your pharmacy*  Testing/Procedures: Your physician has requested that you have an echocardiogram in 12 MONTHS. Echocardiography is a painless test that uses sound waves to create images of your heart. It provides your doctor with information about the size and shape of your heart and how well your heart's chambers and valves are working. This procedure takes approximately one hour. There are no restrictions for this procedure.  Follow-Up: At [facility], you and your health needs are our priority.  As part of our continuing mission to provide you with exceptional heart care, we have created designated Provider Care Teams.  These Care Teams include your primary Cardiologist (physician) and Advanced Practice Providers (APPs -  Physician Assistants and Nurse Practitioners) who all work together to provide you with the care you need, when you need it.  We recommend signing up for the patient portal called "MyChart".  Sign up information is provided on this After Visit Summary.  MyChart is used to connect with patients for Virtual Visits (Telemedicine).  Patients are able to view lab/test results, encounter notes, upcoming appointments, etc.  Non-urgent messages can be sent to your provider as well.   To learn more about what you can do with MyChart, go to [email or web address].    Your next appointment:   12 month(s)  The format for your next appointment:   In Person  Provider:   Dr. Percival Spanish   Important Information About Sugar

## 2021-08-20 DIAGNOSIS — H7293 Unspecified perforation of tympanic membrane, bilateral: Secondary | ICD-10-CM | POA: Diagnosis not present

## 2021-09-18 ENCOUNTER — Ambulatory Visit
Admission: RE | Admit: 2021-09-18 | Discharge: 2021-09-18 | Disposition: A | Discharge status: Home / Self Care | Payer: Medicare PPO | Source: Ambulatory Visit | Attending: Acute Care | Admitting: Acute Care

## 2021-09-18 ENCOUNTER — Other Ambulatory Visit: Payer: Medicare PPO

## 2021-09-18 DIAGNOSIS — I700 Atherosclerosis of aorta: Secondary | ICD-10-CM | POA: Diagnosis not present

## 2021-09-18 DIAGNOSIS — R911 Solitary pulmonary nodule: Secondary | ICD-10-CM

## 2021-09-18 DIAGNOSIS — Z87891 Personal history of nicotine dependence: Secondary | ICD-10-CM

## 2021-09-18 DIAGNOSIS — F17210 Nicotine dependence, cigarettes, uncomplicated: Secondary | ICD-10-CM

## 2021-09-18 DIAGNOSIS — I3139 Other pericardial effusion (noninflammatory): Secondary | ICD-10-CM | POA: Diagnosis not present

## 2021-09-18 DIAGNOSIS — J432 Centrilobular emphysema: Secondary | ICD-10-CM | POA: Diagnosis not present

## 2021-09-20 ENCOUNTER — Other Ambulatory Visit: Payer: Self-pay

## 2021-09-20 DIAGNOSIS — Z122 Encounter for screening for malignant neoplasm of respiratory organs: Secondary | ICD-10-CM

## 2021-09-20 DIAGNOSIS — Z87891 Personal history of nicotine dependence: Secondary | ICD-10-CM

## 2021-09-20 DIAGNOSIS — F17210 Nicotine dependence, cigarettes, uncomplicated: Secondary | ICD-10-CM

## 2021-09-30 ENCOUNTER — Ambulatory Visit
Admission: RE | Admit: 2021-09-30 | Discharge: 2021-09-30 | Disposition: A | Discharge status: Home / Self Care | Payer: Medicare PPO | Source: Ambulatory Visit | Attending: Family Medicine | Admitting: Family Medicine

## 2021-09-30 DIAGNOSIS — Z780 Asymptomatic menopausal state: Secondary | ICD-10-CM | POA: Diagnosis not present

## 2021-10-26 ENCOUNTER — Other Ambulatory Visit: Payer: Self-pay

## 2021-10-26 ENCOUNTER — Ambulatory Visit
Admission: RE | Admit: 2021-10-26 | Discharge: 2021-10-26 | Disposition: A | Discharge status: Home / Self Care | Payer: Medicare PPO | Source: Ambulatory Visit | Attending: Physician Assistant | Admitting: Physician Assistant

## 2021-10-26 VITALS — BP 129/65 | HR 85 | Temp 97.8°F | Resp 18

## 2021-10-26 DIAGNOSIS — J019 Acute sinusitis, unspecified: Secondary | ICD-10-CM | POA: Diagnosis not present

## 2021-10-26 MED ORDER — AMOXICILLIN-POT CLAVULANATE 875-125 MG PO TABS: 1.0000 | ORAL_TABLET | Freq: Two times a day (BID) | ORAL | 0 refills | Status: DC

## 2021-10-26 MED ORDER — ALBUTEROL SULFATE HFA 108 (90 BASE) MCG/ACT IN AERS
1.0000 | INHALATION_SPRAY | Freq: Four times a day (QID) | RESPIRATORY_TRACT | 0 refills | Status: DC | PRN
Start: 2021-10-26 — End: 2021-11-07

## 2021-10-26 MED ORDER — GUAIFENESIN-CODEINE 100-10 MG/5ML PO SOLN: 5.0000 mL | Freq: Every evening | ORAL | 0 refills | Status: DC | PRN

## 2021-10-26 NOTE — ED Provider Notes (Signed)
[facility]    CSN: [id number] Arrival date & time: 10/26/21  1349      History   Chief Complaint Chief Complaint  Patient presents with   Cough   APPT 1400    HPI Ashlee Allen is a 71 y.o. female.   Pt complains of a nonproductive cough with mild wheezing that started about one week ago.  She reports she began experiencing sinus pain and pressure and congestion about 1.5 week ago and thought it had resolved, but sx became worse over the last week.  She denies fever, chills, n/v/d.  She has been taking advil cold and sinus with minimal relief.  Pt is a smoker.     Past Medical History:  Diagnosis Date   Cardiac murmur    Discoid lupus    Dr. Kalman Shan (prev seen at [facility]) and Dr. Ubaldo Glassing   Fibroid    GERD (gastroesophageal reflux disease)    controllled with weight loss    Hyperlipidemia    Pulmonary nodule     Patient Active Problem List   Diagnosis Date Noted   LVH (left ventricular hypertrophy) 11/06/2020   Colon cancer screening 06/13/2020   Lower back pain 06/19/2019   Medicare annual wellness visit, subsequent 01/19/2019   Left arm pain 04/11/2018   Neck pain 02/22/2018   Rash 05/27/2017   Health care maintenance 01/14/2017   Dysuria 01/14/2017   Pulmonary nodule 04/23/2016   HLD (hyperlipidemia) 12/25/2015   Advance care planning 12/13/2014   Smoker 10/28/2012   Urinary incontinence 10/28/2012   Menopause 10/14/2010   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   GERD 01/15/2010   LUPUS ERYTHEMATOSUS, DISCOID 01/15/2010    Past Surgical History:  Procedure Laterality Date   ABDOMINAL HYSTERECTOMY  1997   [facility]  [facility]   BREAST SURGERY  2001   Duct excised-rt   CATARACT EXTRACTION, BILATERAL     CHOLECYSTECTOMY  1991   COLONOSCOPY     HAND SURGERY  2001   ctr-both   INNER EAR SURGERY     left- mastoidectomy   KNEE ARTHROSCOPY Left 05/20/2013   Procedure: LEFT KNEE ARTHROSCOPY WITH DEBRIDEMENT/SHAVING (CHONDROPLASTY) AND LATERAL AND  MEDIAL MENISECTOMY, EXCISION OF PLICA;  Surgeon: Yvette Rack., MD;  Location: [place];  Service: Orthopedics;  Laterality: Left;   LAPAROSCOPIC APPENDECTOMY N/A 04/22/2016   Procedure: APPENDECTOMY LAPAROSCOPIC;  Surgeon: Alphonsa Overall, MD;  Location: [facility];  Service: General;  Laterality: N/A;    OB History     Gravida  3   Para  2   Term  2   Preterm  0   AB  1   Living         SAB  0   IAB  0   Ectopic  0   Multiple      Live Births               Home Medications    Prior to Admission medications   Medication Sig Start Date End Date Taking? Authorizing Provider  albuterol (VENTOLIN HFA) 108 (90 Base) MCG/ACT inhaler Inhale 1-2 puffs into the lungs every 6 (six) hours as needed for wheezing or shortness of breath. 10/26/21  Yes Ward, Lenise Arena, PA-C  amoxicillin-clavulanate (AUGMENTIN) 875-125 MG tablet Take 1 tablet by mouth every 12 (twelve) hours. 10/26/21  Yes Ward, Lenise Arena, PA-C  hydroxychloroquine (PLAQUENIL) 200 MG tablet 1-2 tabs per day (wintertime 1 tab a day, summertime 2 tabs a day) 01/12/17  Yes Tonia Ghent, MD  calcium carbonate (TUMS - DOSED IN MG ELEMENTAL CALCIUM) 500 MG chewable tablet Chew 1 tablet by mouth as needed for indigestion or heartburn.    [provider]  guaiFENesin-codeine 100-10 MG/5ML syrup Take 5 mLs by mouth at bedtime as needed for cough. 10/26/21   Ward, Lenise Arena, PA-C  triamcinolone cream (KENALOG) 0.1 % Apply 1 application topically 2 (two) times daily. As Needed    [provider]    Family History Family History  Problem Relation Age of Onset   Alcohol abuse Mother    Hyperlipidemia Mother    Hypertension Mother    Diabetes Mother    Cancer Mother        LUNG- SMOKER    Alcohol abuse Father    Cancer Father        lung   Seizures Brother    Colon cancer Neg Hx    Breast cancer Neg Hx     Social History Social History   Tobacco Use   Smoking status: Every Day     Packs/day: 1.50    Years: 40.00    Total pack years: 60.00    Types: Cigarettes   Smokeless tobacco: Never  Vaping Use   Vaping Use: Never used  Substance Use Topics   Alcohol use: No   Drug use: No     Allergies   Latex, Lipitor [atorvastatin calcium], and Varenicline tartrate   Review of Systems Review of Systems  Constitutional:  Negative for chills and fever.  HENT:  Positive for congestion, sinus pressure and sinus pain. Negative for ear pain and sore throat.   Eyes:  Negative for pain and visual disturbance.  Respiratory:  Positive for cough and wheezing. Negative for shortness of breath.   Cardiovascular:  Negative for chest pain and palpitations.  Gastrointestinal:  Negative for abdominal pain and vomiting.  Genitourinary:  Negative for dysuria and hematuria.  Musculoskeletal:  Negative for arthralgias and back pain.  Skin:  Negative for color change and rash.  Neurological:  Negative for seizures and syncope.  All other systems reviewed and are negative.    Physical Exam Triage Vital Signs ED Triage Vitals  Enc Vitals Group     BP 10/26/21 1404 129/65     Pulse Rate 10/26/21 1404 85     Resp 10/26/21 1404 18     Temp 10/26/21 1404 97.8 F (36.6 C)     Temp Source 10/26/21 1404 Oral     SpO2 10/26/21 1404 95 %     Weight --      Height --      Head Circumference --      Peak Flow --      Pain Score 10/26/21 1408 0     Pain Loc --      Pain Edu? --      Excl. in [place]? --    No data found.  Updated Vital Signs BP 129/65 (BP Location: Right Arm)   Pulse 85   Temp 97.8 F (36.6 C) (Oral)   Resp 18   SpO2 95%   Visual Acuity Right Eye Distance:   Left Eye Distance:   Bilateral Distance:    Right Eye Near:   Left Eye Near:    Bilateral Near:     Physical Exam Vitals and nursing note reviewed.  Constitutional:      General: She is not in acute distress.    Appearance: She is well-developed.  HENT:  Head: Normocephalic and atraumatic.   Eyes:     Conjunctiva/sclera: Conjunctivae normal.  Cardiovascular:     Rate and Rhythm: Normal rate and regular rhythm.     Heart sounds: No murmur heard. Pulmonary:     Effort: Pulmonary effort is normal. No respiratory distress.     Breath sounds: Examination of the right-middle field reveals wheezing. Examination of the left-middle field reveals wheezing. Wheezing present.  Abdominal:     Palpations: Abdomen is soft.     Tenderness: There is no abdominal tenderness.  Musculoskeletal:        General: No swelling.     Cervical back: Neck supple.  Skin:    General: Skin is warm and dry.     Capillary Refill: Capillary refill takes less than 2 seconds.  Neurological:     Mental Status: She is alert.  Psychiatric:        Mood and Affect: Mood normal.      UC Treatments / Results  Labs (all labs ordered are listed, but only abnormal results are displayed) Labs Reviewed - No data to display  EKG   Radiology No results found.  Procedures Procedures (including critical care time)  Medications Ordered in UC Medications - No data to display  Initial Impression / Assessment and Plan / UC Course  I have reviewed the triage vital signs and the nursing notes.  Pertinent labs & imaging results that were available during my care of the patient were reviewed by me and considered in my medical decision making (see chart for details).     Sinusitis, cough.  Antibiotic prescribed.  Inhaler given to use as needed. Cough syrup prescribed.  Pt stable, vitals wnl, no acute distress, stable for discharge.  Return precautions discussed.  Final Clinical Impressions(s) / UC Diagnoses   Final diagnoses:  Acute non-recurrent sinusitis, unspecified location     Discharge Instructions      Take antibiotic as prescribed Use inhaler as needed for wheezing Take cough syrup as needed before bedtime Follow up with Primary Care Physician  Drink plenty of fluids   ED Prescriptions      Medication Sig Dispense Auth. Provider   amoxicillin-clavulanate (AUGMENTIN) 875-125 MG tablet Take 1 tablet by mouth every 12 (twelve) hours. 14 tablet Ward, Lenise Arena, PA-C   albuterol (VENTOLIN HFA) 108 (90 Base) MCG/ACT inhaler Inhale 1-2 puffs into the lungs every 6 (six) hours as needed for wheezing or shortness of breath. 1 each Ward, Lenise Arena, PA-C   guaiFENesin-codeine 100-10 MG/5ML syrup  (Status: Discontinued) Take 5 mLs by mouth at bedtime as needed for cough. 120 mL Ward, Janett Billow Z, PA-C   guaiFENesin-codeine 100-10 MG/5ML syrup Take 5 mLs by mouth at bedtime as needed for cough. 120 mL Ward, Lenise Arena, PA-C      I have reviewed the PDMP during this encounter.   Ward, Lenise Arena, PA-C 10/26/21 1438

## 2021-10-26 NOTE — Discharge Instructions (Signed)
Take antibiotic as prescribed Use inhaler as needed for wheezing Take cough syrup as needed before bedtime Follow up with Primary Care Physician  Drink plenty of fluids

## 2021-10-26 NOTE — ED Triage Notes (Signed)
Patient c/o nonproductive cough x 1 week.   Patient denies fever.   Patient endorses sinus pressure. Patient endorses headache at times.   Patient endorses chest congestion. Patient endorses SOB when coughing at times.   Patient currently smokes.   Patient has used " Advil cold and sinus" with no relief of symptoms.

## 2021-11-07 ENCOUNTER — Encounter: Payer: Self-pay | Admitting: Family Medicine

## 2021-11-07 ENCOUNTER — Ambulatory Visit: Payer: Medicare PPO | Admitting: Family Medicine

## 2021-11-07 VITALS — BP 118/60 | HR 66 | Temp 97.7°F | Ht 65.0 in | Wt 151.0 lb

## 2021-11-07 DIAGNOSIS — R351 Nocturia: Secondary | ICD-10-CM

## 2021-11-07 DIAGNOSIS — R5383 Other fatigue: Secondary | ICD-10-CM

## 2021-11-07 DIAGNOSIS — R059 Cough, unspecified: Secondary | ICD-10-CM

## 2021-11-07 LAB — CBC WITH DIFFERENTIAL/PLATELET
Basophils Absolute: 0 10*3/uL (ref 0.0–0.1)
Basophils Relative: 0.6 % (ref 0.0–3.0)
Eosinophils Absolute: 0.1 10*3/uL (ref 0.0–0.7)
Eosinophils Relative: 1.5 % (ref 0.0–5.0)
HCT: 38.3 % (ref 36.0–46.0)
Hemoglobin: 13.2 g/dL (ref 12.0–15.0)
Lymphocytes Relative: 16.2 % (ref 12.0–46.0)
Lymphs Abs: 0.9 10*3/uL (ref 0.7–4.0)
MCHC: 34.4 g/dL (ref 30.0–36.0)
MCV: 93.8 fl (ref 78.0–100.0)
Monocytes Absolute: 0.5 10*3/uL (ref 0.1–1.0)
Monocytes Relative: 9.4 % (ref 3.0–12.0)
Neutro Abs: 3.9 10*3/uL (ref 1.4–7.7)
Neutrophils Relative %: 72.3 % (ref 43.0–77.0)
Platelets: 170 10*3/uL (ref 150.0–400.0)
RBC: 4.08 Mil/uL (ref 3.87–5.11)
RDW: 13.5 % (ref 11.5–15.5)
WBC: 5.4 10*3/uL (ref 4.0–10.5)

## 2021-11-07 LAB — BASIC METABOLIC PANEL
BUN: 10 mg/dL (ref 6–23)
CO2: 27 mEq/L (ref 19–32)
Calcium: 8.9 mg/dL (ref 8.4–10.5)
Chloride: 105 mEq/L (ref 96–112)
Creatinine, Ser: 0.71 mg/dL (ref 0.40–1.20)
GFR: 85.13 mL/min (ref >60.00)
Glucose, Bld: 83 mg/dL (ref 70–99)
Potassium: 3.9 mEq/L (ref 3.5–5.1)
Sodium: 139 mEq/L (ref 135–145)

## 2021-11-07 LAB — TSH: TSH: 0.76 u[IU]/mL (ref 0.35–5.50)

## 2021-11-07 LAB — VITAMIN B12: Vitamin B-12: 163 pg/mL — ABNORMAL LOW (ref 211–911)

## 2021-11-07 MED ORDER — OXYBUTYNIN CHLORIDE 5 MG PO TABS: 5.0000 mg | ORAL_TABLET | Freq: Every evening | ORAL | 0 refills | Status: DC | PRN

## 2021-11-07 MED ORDER — CHLORAMPHENICOL POWD: 0 refills | Status: DC

## 2021-11-07 MED ORDER — ALBUTEROL SULFATE HFA 108 (90 BASE) MCG/ACT IN AERS: 1.0000 | INHALATION_SPRAY | Freq: Four times a day (QID) | RESPIRATORY_TRACT | 1 refills | Status: DC | PRN

## 2021-11-07 MED ORDER — PREDNISONE 10 MG PO TABS: ORAL_TABLET | ORAL | 0 refills | Status: DC

## 2021-11-07 NOTE — Progress Notes (Signed)
Cough.  She had antecedent HA and cough, wasn't getting better, then went to UC.  Smoking less recently.  D/w pt.  Done with abx after eval at UC.    Still with some cough, triggered in the upper airway but not as much in the chest.  Some clear sputum.  Some occ wheeze.  Used SABA twice.  Didn't feel jittery with use.  He helped the wheeze.  No fevers, no chills, no vomiting.  She feels better but not back to baseline.  Tessalon didn't help.    She is on chloramphenicol powder per ENT, used in the ear for drainage.    Fatigue noted.  She has trouble staying asleep.  Getting up to void then trouble getting back to sleep.    Meds, vitals, and allergies reviewed.   ROS: Per HPI unless specifically indicated in ROS section   Nad ncat B TM perf noted.  No discharge.   OP wnl  Neck supple no LA Rrr Ctab Skin well perfused.

## 2021-11-07 NOTE — Patient Instructions (Addendum)
Go to the lab on the way out.   If you have mychart we'll likely use that to update you.    Try oxybutynin at night.  See if that helps.   Use albuterol for the cough and then prednisone if needed (with food). Take care.  Glad to see you.

## 2021-11-11 DIAGNOSIS — R351 Nocturia: Secondary | ICD-10-CM | POA: Insufficient documentation

## 2021-11-11 DIAGNOSIS — R059 Cough, unspecified: Secondary | ICD-10-CM | POA: Insufficient documentation

## 2021-11-11 NOTE — Assessment & Plan Note (Signed)
Likely postinfectious.  Discussed options. Use albuterol for the cough and then prednisone if needed (with food).  Steroid cautions discussed with patient.  She agrees to plan.  See notes on labs.

## 2021-11-11 NOTE — Assessment & Plan Note (Signed)
Reasonable  To try oxybutynin at night.  She can see if that helps and update me as needed.

## 2021-11-12 ENCOUNTER — Encounter: Payer: Self-pay | Admitting: Family Medicine

## 2021-11-12 ENCOUNTER — Other Ambulatory Visit: Payer: Self-pay | Admitting: Family Medicine

## 2021-11-12 DIAGNOSIS — E538 Deficiency of other specified B group vitamins: Secondary | ICD-10-CM

## 2021-11-12 MED ORDER — CYANOCOBALAMIN 1000 MCG/ML IJ SOLN: INTRAMUSCULAR | Status: DC

## 2021-11-19 ENCOUNTER — Ambulatory Visit (INDEPENDENT_AMBULATORY_CARE_PROVIDER_SITE_OTHER): Payer: Medicare PPO

## 2021-11-19 DIAGNOSIS — E538 Deficiency of other specified B group vitamins: Secondary | ICD-10-CM | POA: Diagnosis not present

## 2021-11-19 MED ORDER — CYANOCOBALAMIN 1000 MCG/ML IJ SOLN
1000.0000 ug | Freq: Once | INTRAMUSCULAR | Status: AC
  Administered 2021-11-19: 1000 ug via INTRAMUSCULAR

## 2021-11-19 NOTE — Progress Notes (Signed)
Per orders of Dr. Elsie Stain, injection of Vitamin B 12 in left deltoid given by Ozzie Hoyle. Patient tolerated injection well. First B 12 injection and pt waited few mins after injection at office with no symptoms.

## 2021-11-26 ENCOUNTER — Ambulatory Visit (INDEPENDENT_AMBULATORY_CARE_PROVIDER_SITE_OTHER): Payer: Medicare PPO

## 2021-11-26 DIAGNOSIS — E538 Deficiency of other specified B group vitamins: Secondary | ICD-10-CM | POA: Diagnosis not present

## 2021-11-26 MED ORDER — CYANOCOBALAMIN 1000 MCG/ML IJ SOLN
1000.0000 ug | Freq: Once | INTRAMUSCULAR | Status: AC
  Administered 2021-11-26: 1000 ug via INTRAMUSCULAR

## 2021-11-26 NOTE — Progress Notes (Signed)
Per orders of Dr. Javier Gutierrez, injection of Vitamin B 12 given in right deltoid given by Bacilio Abascal. Patient tolerated injection well.  

## 2021-11-27 ENCOUNTER — Ambulatory Visit (INDEPENDENT_AMBULATORY_CARE_PROVIDER_SITE_OTHER): Payer: Medicare PPO

## 2021-11-27 DIAGNOSIS — Z23 Encounter for immunization: Secondary | ICD-10-CM

## 2021-12-04 ENCOUNTER — Ambulatory Visit (INDEPENDENT_AMBULATORY_CARE_PROVIDER_SITE_OTHER): Payer: Medicare PPO | Admitting: *Deleted

## 2021-12-04 DIAGNOSIS — E538 Deficiency of other specified B group vitamins: Secondary | ICD-10-CM | POA: Diagnosis not present

## 2021-12-04 MED ORDER — CYANOCOBALAMIN 1000 MCG/ML IJ SOLN
1000.0000 ug | Freq: Once | INTRAMUSCULAR | Status: AC
  Administered 2021-12-04: 1000 ug via INTRAMUSCULAR

## 2021-12-04 NOTE — Progress Notes (Signed)
Per orders of Dr. Gutierrez, injection of Vitamin B-12 given by Braeden Kennan. Patient tolerated injection well. 

## 2021-12-10 DIAGNOSIS — H7293 Unspecified perforation of tympanic membrane, bilateral: Secondary | ICD-10-CM | POA: Diagnosis not present

## 2021-12-11 ENCOUNTER — Ambulatory Visit (INDEPENDENT_AMBULATORY_CARE_PROVIDER_SITE_OTHER): Payer: Medicare PPO | Admitting: *Deleted

## 2021-12-11 DIAGNOSIS — E538 Deficiency of other specified B group vitamins: Secondary | ICD-10-CM

## 2021-12-11 MED ORDER — CYANOCOBALAMIN 1000 MCG/ML IJ SOLN
1000.0000 ug | Freq: Once | INTRAMUSCULAR | Status: AC
  Administered 2021-12-11: 1000 ug via INTRAMUSCULAR

## 2021-12-11 NOTE — Progress Notes (Signed)
Per orders of Dr. Gutierrez, injection of Vitamin B-12 given by Giavanni Zeitlin. Patient tolerated injection well. 

## 2022-01-08 DIAGNOSIS — L821 Other seborrheic keratosis: Secondary | ICD-10-CM | POA: Diagnosis not present

## 2022-01-08 DIAGNOSIS — D1801 Hemangioma of skin and subcutaneous tissue: Secondary | ICD-10-CM | POA: Diagnosis not present

## 2022-01-08 DIAGNOSIS — L930 Discoid lupus erythematosus: Secondary | ICD-10-CM | POA: Diagnosis not present

## 2022-01-08 DIAGNOSIS — L738 Other specified follicular disorders: Secondary | ICD-10-CM | POA: Diagnosis not present

## 2022-01-08 DIAGNOSIS — L720 Epidermal cyst: Secondary | ICD-10-CM | POA: Diagnosis not present

## 2022-01-08 DIAGNOSIS — L814 Other melanin hyperpigmentation: Secondary | ICD-10-CM | POA: Diagnosis not present

## 2022-01-08 DIAGNOSIS — L309 Dermatitis, unspecified: Secondary | ICD-10-CM | POA: Diagnosis not present

## 2022-01-09 ENCOUNTER — Ambulatory Visit (INDEPENDENT_AMBULATORY_CARE_PROVIDER_SITE_OTHER): Payer: Medicare PPO

## 2022-01-09 DIAGNOSIS — E538 Deficiency of other specified B group vitamins: Secondary | ICD-10-CM

## 2022-01-09 MED ORDER — CYANOCOBALAMIN 1000 MCG/ML IJ SOLN
1000.0000 ug | Freq: Once | INTRAMUSCULAR | Status: AC
  Administered 2022-01-09: 1000 ug via INTRAMUSCULAR

## 2022-01-09 NOTE — Progress Notes (Signed)
Patient presented for B 12 injection given by Lolah Coghlan, CMA to left deltoid, patient voiced no concerns nor showed any signs of distress during injection.  

## 2022-01-16 DIAGNOSIS — Z1231 Encounter for screening mammogram for malignant neoplasm of breast: Secondary | ICD-10-CM | POA: Diagnosis not present

## 2022-01-16 LAB — HM MAMMOGRAPHY

## 2022-01-22 DIAGNOSIS — Z79899 Other long term (current) drug therapy: Secondary | ICD-10-CM | POA: Diagnosis not present

## 2022-01-27 ENCOUNTER — Ambulatory Visit
Admission: RE | Admit: 2022-01-27 | Discharge: 2022-01-27 | Disposition: A | Discharge status: Home / Self Care | Payer: Medicare PPO | Source: Ambulatory Visit | Attending: Physician Assistant | Admitting: Physician Assistant

## 2022-01-27 ENCOUNTER — Ambulatory Visit (INDEPENDENT_AMBULATORY_CARE_PROVIDER_SITE_OTHER): Payer: Medicare PPO

## 2022-01-27 VITALS — BP 124/72 | HR 85 | Temp 98.0°F | Resp 16

## 2022-01-27 DIAGNOSIS — R059 Cough, unspecified: Secondary | ICD-10-CM

## 2022-01-27 DIAGNOSIS — J0100 Acute maxillary sinusitis, unspecified: Secondary | ICD-10-CM

## 2022-01-27 DIAGNOSIS — J209 Acute bronchitis, unspecified: Secondary | ICD-10-CM

## 2022-01-27 MED ORDER — PREDNISONE 20 MG PO TABS: 40.0000 mg | ORAL_TABLET | Freq: Every day | ORAL | 0 refills | Status: AC

## 2022-01-27 MED ORDER — AMOXICILLIN-POT CLAVULANATE 875-125 MG PO TABS: 1.0000 | ORAL_TABLET | Freq: Two times a day (BID) | ORAL | 0 refills | Status: DC

## 2022-01-27 MED ORDER — PROMETHAZINE-DM 6.25-15 MG/5ML PO SYRP: 5.0000 mL | ORAL_SOLUTION | Freq: Four times a day (QID) | ORAL | 0 refills | Status: DC | PRN

## 2022-01-27 NOTE — ED Triage Notes (Signed)
Pt c/o frontal sinus burning sensation, headache, cough, left ear problem, onset ~ 2 weeks ago.

## 2022-01-27 NOTE — ED Provider Notes (Signed)
[facility]    CSN: [id number] Arrival date & time: 01/27/22  0900      History   Chief Complaint Chief Complaint  Patient presents with   Cough    Sinus and wheezing - Entered by patient    HPI Ashlee Allen is a 71 y.o. female.   Patient here today for evaluation of cough, congestion, wheezing that started about 2 weeks ago.  She reports that she has had significant sinus pressure.  She has not had any vomiting or diarrhea.  She has tried multiple over-the-counter medications without resolution.  She does not have history of asthma or COPD but is a smoker.  The history is provided by the patient.  Cough Associated symptoms: sore throat and wheezing   Associated symptoms: no chills, no ear pain, no eye discharge, no fever and no shortness of breath     Past Medical History:  Diagnosis Date   Cardiac murmur    Discoid lupus    Dr. Kalman Shan (prev seen at [facility]) and Dr. Ubaldo Glassing   Fibroid    GERD (gastroesophageal reflux disease)    controllled with weight loss    Hyperlipidemia    Pulmonary nodule     Patient Active Problem List   Diagnosis Date Noted   B12 deficiency 11/12/2021   Nocturia 11/11/2021   Cough 11/11/2021   LVH (left ventricular hypertrophy) 11/06/2020   Colon cancer screening 06/13/2020   Lower back pain 06/19/2019   Medicare annual wellness visit, subsequent 01/19/2019   Left arm pain 04/11/2018   Neck pain 02/22/2018   Rash 05/27/2017   Health care maintenance 01/14/2017   Dysuria 01/14/2017   Pulmonary nodule 04/23/2016   HLD (hyperlipidemia) 12/25/2015   Advance care planning 12/13/2014   Smoker 10/28/2012   Urinary incontinence 10/28/2012   Menopause 10/14/2010   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   GERD 01/15/2010   LUPUS ERYTHEMATOSUS, DISCOID 01/15/2010    Past Surgical History:  Procedure Laterality Date   ABDOMINAL HYSTERECTOMY  1997   TAH  [facility]   BREAST SURGERY  2001   Duct excised-rt   CATARACT  EXTRACTION, BILATERAL     CHOLECYSTECTOMY  1991   COLONOSCOPY     HAND SURGERY  2001   ctr-both   INNER EAR SURGERY     left- mastoidectomy   KNEE ARTHROSCOPY Left 05/20/2013   Procedure: LEFT KNEE ARTHROSCOPY WITH DEBRIDEMENT/SHAVING (CHONDROPLASTY) AND LATERAL AND MEDIAL MENISECTOMY, EXCISION OF PLICA;  Surgeon: Yvette Rack., MD;  Location: [place];  Service: Orthopedics;  Laterality: Left;   LAPAROSCOPIC APPENDECTOMY N/A 04/22/2016   Procedure: APPENDECTOMY LAPAROSCOPIC;  Surgeon: Alphonsa Overall, MD;  Location: [facility];  Service: General;  Laterality: N/A;    OB History     Gravida  3   Para  2   Term  2   Preterm  0   AB  1   Living         SAB  0   IAB  0   Ectopic  0   Multiple      Live Births               Home Medications    Prior to Admission medications   Medication Sig Start Date End Date Taking? Authorizing Provider  amoxicillin-clavulanate (AUGMENTIN) 875-125 MG tablet Take 1 tablet by mouth every 12 (twelve) hours. 01/27/22  Yes Francene Finders, PA-C  predniSONE (DELTASONE) 20 MG tablet Take 2 tablets (40 mg  total) by mouth daily with breakfast for 5 days. 01/27/22 02/01/22 Yes Francene Finders, PA-C  promethazine-dextromethorphan (PROMETHAZINE-DM) 6.25-15 MG/5ML syrup Take 5 mLs by mouth 4 (four) times daily as needed for cough. 01/27/22  Yes Francene Finders, PA-C  albuterol (VENTOLIN HFA) 108 (90 Base) MCG/ACT inhaler Inhale 1-2 puffs into the lungs every 6 (six) hours as needed for wheezing or shortness of breath. 11/07/21   Tonia Ghent, MD  calcium carbonate (TUMS - DOSED IN MG ELEMENTAL CALCIUM) 500 MG chewable tablet Chew 1 tablet by mouth as needed for indigestion or heartburn.    [provider]  Chloramphenicol POWD Use as directed by ENT in the ear. 11/07/21   Tonia Ghent, MD  cyanocobalamin (VITAMIN B12) 1000 MCG/ML injection 1066mg IM weekly x4 doses then monthly thereafter 11/12/21   DTonia Ghent MD  hydroxychloroquine (PLAQUENIL) 200 MG tablet 1-2 tabs per day (wintertime 1 tab a day, summertime 2 tabs a day) 01/12/17   DTonia Ghent MD  oxybutynin (DITROPAN) 5 MG tablet Take 1 tablet (5 mg total) by mouth at bedtime as needed. 11/07/21   DTonia Ghent MD  triamcinolone cream (KENALOG) 0.1 % Apply 1 application topically 2 (two) times daily. As Needed    [provider]    Family History Family History  Problem Relation Age of Onset   Alcohol abuse Mother    Hyperlipidemia Mother    Hypertension Mother    Diabetes Mother    Cancer Mother        LUNG- SMOKER    Alcohol abuse Father    Cancer Father        lung   Seizures Brother    Colon cancer Neg Hx    Breast cancer Neg Hx     Social History Social History   Tobacco Use   Smoking status: Every Day    Packs/day: 1.50    Years: 40.00    Total pack years: 60.00    Types: Cigarettes   Smokeless tobacco: Never  Vaping Use   Vaping Use: Never used  Substance Use Topics   Alcohol use: No   Drug use: No     Allergies   Latex, Lipitor [atorvastatin calcium], and Varenicline tartrate   Review of Systems Review of Systems  Constitutional:  Negative for chills and fever.  HENT:  Positive for congestion, sinus pressure and sore throat. Negative for ear pain.   Eyes:  Negative for discharge and redness.  Respiratory:  Positive for cough and wheezing. Negative for shortness of breath.   Gastrointestinal:  Negative for abdominal pain, diarrhea, nausea and vomiting.     Physical Exam Triage Vital Signs ED Triage Vitals  Enc Vitals Group     BP 01/27/22 0921 124/72     Pulse Rate 01/27/22 0921 85     Resp 01/27/22 0921 16     Temp 01/27/22 0921 98 F (36.7 C)     Temp Source 01/27/22 0921 Oral     SpO2 01/27/22 0921 98 %     Weight --      Height --      Head Circumference --      Peak Flow --      Pain Score 01/27/22 0922 6     Pain Loc --      Pain Edu? --      Excl. in [place] --     No data found.  Updated Vital Signs BP 124/72 (BP  Location: Left Arm)   Pulse 85   Temp 98 F (36.7 C) (Oral)   Resp 16   SpO2 98%      Physical Exam Vitals and nursing note reviewed.  Constitutional:      General: She is not in acute distress.    Appearance: Normal appearance. She is not ill-appearing.  HENT:     Head: Normocephalic and atraumatic.     Right Ear: Tympanic membrane normal.     Left Ear: Tympanic membrane normal.     Nose: Congestion present.     Mouth/Throat:     Mouth: Mucous membranes are moist.     Pharynx: No oropharyngeal exudate or posterior oropharyngeal erythema.  Eyes:     Conjunctiva/sclera: Conjunctivae normal.  Cardiovascular:     Rate and Rhythm: Normal rate and regular rhythm.     Heart sounds: Normal heart sounds. No murmur heard. Pulmonary:     Effort: Pulmonary effort is normal. No respiratory distress.     Breath sounds: Normal breath sounds. No wheezing, rhonchi or rales.  Skin:    General: Skin is warm and dry.  Neurological:     Mental Status: She is alert.  Psychiatric:        Mood and Affect: Mood normal.        Thought Content: Thought content normal.      UC Treatments / Results  Labs (all labs ordered are listed, but only abnormal results are displayed) Labs Reviewed - No data to display  EKG   Radiology DG Chest 2 View  Result Date: 01/27/2022 CLINICAL DATA:  Cough. EXAM: CHEST - 2 VIEW COMPARISON:  May 13, 2020. FINDINGS: The heart size and mediastinal contours are within normal limits. Both lungs are clear. The visualized skeletal structures are unremarkable. IMPRESSION: No active cardiopulmonary disease. Electronically Signed   By: Marijo Conception M.D.   On: 01/27/2022 09:47    Procedures Procedures (including critical care time)  Medications Ordered in UC Medications - No data to display  Initial Impression / Assessment and Plan / UC Course  I have reviewed the triage vital signs and the nursing  notes.  Pertinent labs & imaging results that were available during my care of the patient were reviewed by me and considered in my medical decision making (see chart for details).    Chest x-ray ordered, negative.  Will treat to cover bronchitis and sinusitis.  Cough syrup also prescribed at patient's request.  Recommended further evaluation if no gradual improvement or with any further concerns.  Final Clinical Impressions(s) / UC Diagnoses   Final diagnoses:  Acute maxillary sinusitis, recurrence not specified  Acute bronchitis, unspecified organism   Discharge Instructions   None    ED Prescriptions     Medication Sig Dispense Auth. Provider   predniSONE (DELTASONE) 20 MG tablet Take 2 tablets (40 mg total) by mouth daily with breakfast for 5 days. 10 tablet Francene Finders, PA-C   amoxicillin-clavulanate (AUGMENTIN) 875-125 MG tablet Take 1 tablet by mouth every 12 (twelve) hours. 14 tablet Ewell Poe F, PA-C   promethazine-dextromethorphan (PROMETHAZINE-DM) 6.25-15 MG/5ML syrup Take 5 mLs by mouth 4 (four) times daily as needed for cough. 118 mL Francene Finders, PA-C      PDMP not reviewed this encounter.   Francene Finders, PA-C 01/27/22 1350

## 2022-02-11 ENCOUNTER — Ambulatory Visit (INDEPENDENT_AMBULATORY_CARE_PROVIDER_SITE_OTHER): Payer: Medicare PPO

## 2022-02-11 ENCOUNTER — Other Ambulatory Visit: Payer: Medicare PPO

## 2022-02-11 ENCOUNTER — Ambulatory Visit: Payer: Medicare PPO

## 2022-02-11 ENCOUNTER — Other Ambulatory Visit (INDEPENDENT_AMBULATORY_CARE_PROVIDER_SITE_OTHER): Payer: Medicare PPO

## 2022-02-11 DIAGNOSIS — E538 Deficiency of other specified B group vitamins: Secondary | ICD-10-CM

## 2022-02-11 LAB — VITAMIN B12: Vitamin B-12: 231 pg/mL (ref 211–911)

## 2022-02-11 MED ORDER — CYANOCOBALAMIN 1000 MCG/ML IJ SOLN
1000.0000 ug | Freq: Once | INTRAMUSCULAR | Status: AC
  Administered 2022-02-11: 1000 ug via INTRAMUSCULAR

## 2022-02-11 NOTE — Progress Notes (Signed)
Per orders of Dr. Graham Duncan, injection of B-12 given by Keiden Deskin Y Ivy Meriwether in left deltoid. Patient tolerated injection well. Patient will make appointment for 1 month.   

## 2022-02-13 ENCOUNTER — Other Ambulatory Visit: Payer: Self-pay | Admitting: Family Medicine

## 2022-02-13 MED ORDER — CYANOCOBALAMIN 1000 MCG/ML IJ SOLN
INTRAMUSCULAR | Status: AC
Start: 2022-02-13 — End: ?

## 2022-03-14 ENCOUNTER — Ambulatory Visit (INDEPENDENT_AMBULATORY_CARE_PROVIDER_SITE_OTHER): Payer: Medicare PPO | Admitting: *Deleted

## 2022-03-14 DIAGNOSIS — E538 Deficiency of other specified B group vitamins: Secondary | ICD-10-CM | POA: Diagnosis not present

## 2022-03-14 MED ORDER — CYANOCOBALAMIN 1000 MCG/ML IJ SOLN
1000.0000 ug | Freq: Once | INTRAMUSCULAR | Status: AC
  Administered 2022-03-14: 1000 ug via INTRAMUSCULAR

## 2022-03-14 NOTE — Progress Notes (Signed)
Per orders of Dr. Damita Dunnings, injection of Vitamin B 12 given by Emelia Salisbury. Patient tolerated injection well.

## 2022-04-03 ENCOUNTER — Ambulatory Visit (INDEPENDENT_AMBULATORY_CARE_PROVIDER_SITE_OTHER): Payer: Medicare PPO

## 2022-04-03 VITALS — Ht 65.0 in | Wt 151.0 lb

## 2022-04-03 DIAGNOSIS — Z0000 Encounter for general adult medical examination without abnormal findings: Secondary | ICD-10-CM | POA: Diagnosis not present

## 2022-04-03 NOTE — Progress Notes (Signed)
Subjective:   Ashlee Allen is a 72 y.o. female who presents for Medicare Annual (Subsequent) preventive examination.  Review of Systems    No ROS.  Medicare Wellness Virtual Visit.  Visual/audio telehealth visit, UTA vital signs.   See social history for additional risk factors.   Cardiac Risk Factors include: advanced age (>43mn, >>19women)     Objective:    Today's Vitals   04/03/22 1130  Weight: 151 lb (68.5 kg)  Height: '5\' 5"'$  (1.651 m)   Body mass index is 25.13 kg/m.     04/03/2022   11:31 AM 04/02/2021   11:17 AM 01/12/2019    2:00 PM 01/06/2018   11:52 AM 01/02/2017    2:06 PM 04/22/2016    3:00 AM 04/21/2016    5:39 PM  Advanced Directives  Does Patient Have a Medical Advance Directive? Yes Yes Yes Yes Yes Yes No  Type of AParamedicof [place]Living will [place]ina ShoresLiving will [place]Living will HFort RansomLiving will HTaylorLiving will Living will   Does patient want to make changes to medical advance directive? No - Patient declined Yes (MAU/Ambulatory/Procedural Areas - Information given)    No - Patient declined   Copy of [place]ake Wyliein Chart? Yes - validated most recent copy scanned in chart (See row information)  No - copy requested No - copy requested No - copy requested      Current Medications (verified) Outpatient Encounter Medications as of 04/03/2022  Medication Sig   albuterol (VENTOLIN HFA) 108 (90 Base) MCG/ACT inhaler Inhale 1-2 puffs into the lungs every 6 (six) hours as needed for wheezing or shortness of breath.   amoxicillin-clavulanate (AUGMENTIN) 875-125 MG tablet Take 1 tablet by mouth every 12 (twelve) hours.   calcium carbonate (TUMS - DOSED IN MG ELEMENTAL CALCIUM) 500 MG chewable tablet Chew 1 tablet by mouth as needed for indigestion or heartburn.   Chloramphenicol POWD Use as directed by ENT in the ear.    cyanocobalamin (VITAMIN B12) 1000 MCG/ML injection 10071m IM monthly   hydroxychloroquine (PLAQUENIL) 200 MG tablet 1-2 tabs per day (wintertime 1 tab a day, summertime 2 tabs a day)   oxybutynin (DITROPAN) 5 MG tablet Take 1 tablet (5 mg total) by mouth at bedtime as needed.   promethazine-dextromethorphan (PROMETHAZINE-DM) 6.25-15 MG/5ML syrup Take 5 mLs by mouth 4 (four) times daily as needed for cough.   triamcinolone cream (KENALOG) 0.1 % Apply 1 application topically 2 (two) times daily. As Needed   No facility-administered encounter medications on file as of 04/03/2022.    Allergies (verified) Latex, Lipitor [atorvastatin calcium], and Varenicline tartrate   History: Past Medical History:  Diagnosis Date   Cardiac murmur    Discoid lupus    [facility]    GERD (gastroesophageal reflux disease)    controllled with weight loss    Hyperlipidemia    Pulmonary nodule    Past Surgical History:  Procedure Laterality Date   ABDOMINAL HYSTERECTOMY  1997   TAH  [facility]   BREAST SURGERY  2001   Duct excised-rt   CATARACT EXTRACTION, BILATERAL     CHOLECYSTECTOMY  1991   COLONOSCOPY     HAND SURGERY  2001   ctr-both   INNER EAR SURGERY     left- mastoidectomy   KNEE ARTHROSCOPY Left 05/20/2013   Procedure: LEFT KNEE ARTHROSCOPY WITH  DEBRIDEMENT/SHAVING (CHONDROPLASTY) AND LATERAL AND MEDIAL MENISECTOMY, EXCISION OF PLICA;  Surgeon: Yvette Rack., MD;  Location: [place];  Service: Orthopedics;  Laterality: Left;   LAPAROSCOPIC APPENDECTOMY N/A 04/22/2016   Procedure: APPENDECTOMY LAPAROSCOPIC;  Surgeon: Alphonsa Overall, MD;  Location[facility];  Service: General;  Laterality: N/A;   Family History  Problem Relation Age of Onset   Alcohol abuse Mother    Hyperlipidemia Mother    Hypertension Mother    Diabetes Mother    Cancer Mother        LUNG- SMOKER    Alcohol abuse Father    Cancer Father        lung   Seizures  Brother    Colon cancer Neg Hx    Breast cancer Neg Hx    Social History   Socioeconomic History   Marital status: Married    Spouse name: Not on file   Number of children: 2   Years of education: Not on file   Highest education level: Not on file  Occupational History   Occupation: Retired Associate Professor: [employer]  Tobacco Use   Smoking status: Every Day    Packs/day: 1.50    Years: 40.00    Total pack years: 60.00    Types: Cigarettes   Smokeless tobacco: Never  Vaping Use   Vaping Use: Never used  Substance and Sexual Activity   Alcohol use: No   Drug use: No   Sexual activity: Not on file  Other Topics Concern   Not on file  Social History Narrative    Walking for exercise   Married 1978   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     Social Determinants of Health   Financial Resource Strain: Low Risk  (04/02/2021)   Overall Financial Resource Strain (CARDIA)    Difficulty of Paying Living Expenses: Not hard at all  Food Insecurity: No Food Insecurity (04/02/2021)   Hunger Vital Sign    Worried About Running Out of Food in the Last Year: Never true    Ran Out of Food in the Last Year: Never true  Transportation Needs: No Transportation Needs (04/02/2021)   PRAPARE - Hydrologist (Medical): No    Lack of Transportation (Non-Medical): No  Physical Activity: Inactive (04/02/2021)   Exercise Vital Sign    Days of Exercise per Week: 0 days    Minutes of Exercise per Session: 0 min  Stress: No Stress Concern Present (04/02/2021)   [place]    Feeling of Stress : Only a little  Social Connections: Moderately Isolated (04/02/2021)   Social Connection and Isolation Panel [NHANES]    Frequency of Communication with Friends and Family: More than three times a week    Frequency of Social Gatherings with Friends and Family: Three times a week     Attends Religious Services: Never    Active Member of Clubs or Organizations: No    Attends Archivist Meetings: Never    Marital Status: Married    Tobacco Counseling Ready to quit: Not Answered Counseling given: Not Answered   Clinical Intake:  Pre-visit preparation completed: Yes        Diabetes: No  How often do you need to have someone help you when you read instructions, pamphlets, or other written materials from your doctor or pharmacy?: 1 - Never  Interpreter Needed?: No      Activities of Daily Living    04/03/2022   11:24 AM  In your present state of health, do you have any difficulty performing the following activities:  Hearing? 1  Comment Hearing aids  Vision? 0  Difficulty concentrating or making decisions? 0  Walking or climbing stairs? 0  Dressing or bathing? 0  Doing errands, shopping? 0  Preparing Food and eating ? N  Using the Toilet? N  In the past six months, have you accidently leaked urine? Y  Comment Managed with pad daily liner  Do you have problems with loss of bowel control? N  Managing your Medications? N  Managing your Finances? N  Housekeeping or managing your Housekeeping? N    Patient Care Team: Tonia Ghent, MD as PCP - General [place], Heather, OD as Referring Physician (Optometry)  Indicate any recent Medical Services you may have received from other than [facility] providers in the past year (date may be approximate).     Assessment:   This is a routine wellness examination for Stephaney.  I connected with  Renelda Loma on 04/03/22 by a audio enabled telemedicine application and verified that I am speaking with the correct person using two identifiers.  Patient Location: Home  Provider Location: Office/Clinic  I discussed the limitations of evaluation and management by telemedicine. The patient expressed understanding and agreed to proceed.   Hearing/Vision screen Hearing Screening - Comments:: Hearing aid,  bilateral  Vision Screening - Comments::  Last exam 2023, Dr. [place], wears readers Cataracts extracted, bilateral  Dietary issues and exercise activities discussed: Current Exercise Habits: Home exercise routine, Type of exercise: walking;strength training/weights;stretching, Time (Minutes): 60, Frequency (Times/Week): 3, Weekly Exercise (Minutes/Week): 180, Intensity: Mild   Goals Addressed             This Visit's Progress    Maintain healthy lifestyle   On track    Stay active with husband at the [facility] 3 days weekly       Depression Screen    04/03/2022   11:29 AM 04/02/2021   11:20 AM 01/12/2019    2:01 PM 01/06/2018   11:42 AM 01/02/2017    2:06 PM 12/19/2015    8:21 AM 12/11/2014   10:33 AM  PHQ 2/9 Scores  PHQ - 2 Score 0 0 0 0 0 0 0  PHQ- 9 Score   0 0 0      Fall Risk    04/03/2022   11:29 AM 04/02/2021   11:19 AM 08/31/2020   12:14 PM 06/12/2020   11:36 AM 01/12/2019    2:01 PM  Fall Risk   Falls in the past year? 0 0 0 0 1  Comment     tripped on purse  Number falls in past yr: 0 0 0 0 0  Injury with Fall? 0 0 0 0 0  Risk for fall due to :    No Fall Risks   Follow up Falls evaluation completed;Falls prevention discussed Falls prevention discussed Falls evaluation completed Falls evaluation completed Falls evaluation completed;Falls prevention discussed    FALL RISK PREVENTION PERTAINING TO THE HOME: Home free of loose throw rugs in walkways, pet beds, electrical cords, etc? Yes  Adequate lighting in your home to reduce risk of falls? Yes   ASSISTIVE DEVICES UTILIZED TO PREVENT FALLS: Life alert? No  Use of a cane, walker or w/c? No   TIMED UP AND GO: Was the test performed? No .  Cognitive Function:    01/12/2019    2:04 PM 01/06/2018   11:43 AM 01/02/2017    2:22 PM 12/19/2015    8:37 AM  MMSE - Mini Mental State Exam  Orientation to time '5 5 5 5  '$ Orientation to Place '5 5 5 5  '$ Registration '3 3 3 3  '$ Attention/ Calculation 5 0 0 0  Recall '3 3 3  3  '$ Language- name 2 objects  0 0 0  Language- repeat '1 1 1 1  '$ Language- follow 3 step command  '3 3 3  '$ Language- read & follow direction  0 0 0  Write a sentence  0 0 0  Copy design  0 0 0  Total score  '20 20 20        '$ 04/03/2022   11:29 AM  6CIT Screen  What Year? 0 points  What month? 0 points  What time? 0 points  Count back from 20 0 points  Months in reverse 0 points  Repeat phrase 0 points  Total Score 0 points    Immunizations Immunization History  Administered Date(s) Administered   Fluad Quad(high Dose 65+) 11/19/2018, 11/27/2021   Influenza Split 12/25/2010, 12/17/2011, 12/02/2012   Influenza Whole 01/08/2010, 12/17/2011   Influenza, High Dose Seasonal PF 01/02/2017, 12/08/2017   Influenza,inj,Quad PF,6+ Mos 12/11/2014, 12/19/2015   Influenza-Unspecified 12/07/2013, 12/08/2017, 12/28/2020   PFIZER(Purple Top)SARS-COV-2 Vaccination 03/30/2019, 04/20/2019, 11/17/2019, 08/08/2020   Pfizer Covid-19 Vaccine Bivalent Booster 61yr & up 01/04/2021   Pneumococcal Conjugate-13 12/11/2014   Pneumococcal Polysaccharide-23 12/19/2015   Td 08/24/2007   Tdap 04/28/2013   Zoster Recombinat (Shingrix) 01/18/2019, 03/22/2019   Screening Tests Health Maintenance  Topic Date Due   COVID-19 Vaccine (6 - 2023-24 season) 04/19/2022 (Originally 11/08/2021)   Lung Cancer Screening  09/19/2022   Medicare Annual Wellness (AWV)  04/04/2023   DTaP/Tdap/Td (3 - Td or Tdap) 04/29/2023   COLONOSCOPY (Pts 45-421yrInsurance coverage will need to be confirmed)  12/21/2023   MAMMOGRAM  01/17/2024   Pneumonia Vaccine 6578Years old  Completed   INFLUENZA VACCINE  Completed   DEXA SCAN  Completed   Hepatitis C Screening  Completed   Zoster Vaccines- Shingrix  Completed   HPV VACCINES  Aged Out   Health Maintenance There are no preventive care reminders to display for this patient.  Lung Cancer Screening: Completed 01/27/22.  Hepatitis C Screening: Completed 10/2012.  Vision Screening:  Recommended annual ophthalmology exams for early detection of glaucoma and other disorders of the eye.  Dental Screening: Recommended annual dental exams for proper oral hygiene  Community Resource Referral / Chronic Care Management: CRR required this visit?  No   CCM required this visit?  No      Plan:     I have personally reviewed and noted the following in the patient's chart:   Medical and social history Use of alcohol, tobacco or illicit drugs  Current medications and supplements including opioid prescriptions. Patient is not currently taking opioid prescriptions. Functional ability and status Nutritional status Physical activity Advanced directives List of other physicians Hospitalizations, surgeries, and ER visits in previous 12 months Vitals Screenings to include cognitive, depression, and falls Referrals and appointments  In addition, I have reviewed and discussed with patient certain preventive protocols, quality metrics, and best practice recommendations. A written personalized care plan for preventive services as well as general preventive health recommendations were provided to patient.     DeLeta JunglingLPN   03/17/50/7782

## 2022-04-03 NOTE — Patient Instructions (Addendum)
Ashlee Allen , Thank you for taking time to come for your Medicare Wellness Visit. I appreciate your ongoing commitment to your health goals. Please review the following plan we discussed and let me know if I can assist you in the future.   These are the goals we discussed:     Goals Addressed             This Visit's Progress    Maintain healthy lifestyle   On track    Stay active with husband at the [facility] 3 days weekly       This is a list of the screening recommended for you and due dates:  Health Maintenance  Topic Date Due   COVID-19 Vaccine (6 - 2023-24 season) 04/19/2022*   Screening for Lung Cancer  09/19/2022   Medicare Annual Wellness Visit  04/04/2023   DTaP/Tdap/Td vaccine (3 - Td or Tdap) 04/29/2023   Colon Cancer Screening  12/21/2023   Mammogram  01/17/2024   Pneumonia Vaccine  Completed   Flu Shot  Completed   DEXA scan (bone density measurement)  Completed   Hepatitis C Screening: USPSTF Recommendation to screen - Ages 51-79 yo.  Completed   Zoster (Shingles) Vaccine  Completed   HPV Vaccine  Aged Out  *Topic was postponed. The date shown is not the original due date.    Advanced directives: on file  Conditions/risks identified: none new  Next appointment: Follow up in one year for your annual wellness visit    Preventive Care 65 Years and Older, Female Preventive care refers to lifestyle choices and visits with your health care provider that can promote health and wellness. What does preventive care include? A yearly physical exam. This is also called an annual well check. Dental exams once or twice a year. Routine eye exams. Ask your health care provider how often you should have your eyes checked. Personal lifestyle choices, including: Daily care of your teeth and gums. Regular physical activity. Eating a healthy diet. Avoiding tobacco and drug use. Limiting alcohol use. Practicing safe sex. Taking low-dose aspirin every day. Taking vitamin  and mineral supplements as recommended by your health care provider. What happens during an annual well check? The services and screenings done by your health care provider during your annual well check will depend on your age, overall health, lifestyle risk factors, and family history of disease. Counseling  Your health care provider may ask you questions about your: Alcohol use. Tobacco use. Drug use. Emotional well-being. Home and relationship well-being. Sexual activity. Eating habits. History of falls. Memory and ability to understand (cognition). Work and work Statistician. Reproductive health. Screening  You may have the following tests or measurements: Height, weight, and BMI. Blood pressure. Lipid and cholesterol levels. These may be checked every 5 years, or more frequently if you are over 73 years old. Skin check. Lung cancer screening. You may have this screening every year starting at age 63 if you have a 30-pack-year history of smoking and currently smoke or have quit within the past 15 years. Fecal occult blood test (FOBT) of the stool. You may have this test every year starting at age 39. Flexible sigmoidoscopy or colonoscopy. You may have a sigmoidoscopy every 5 years or a colonoscopy every 10 years starting at age 50. Hepatitis C blood test. Hepatitis B blood test. Sexually transmitted disease (STD) testing. Diabetes screening. This is done by checking your blood sugar (glucose) after you have not eaten for a while (fasting). You may  have this done every 1-3 years. Bone density scan. This is done to screen for osteoporosis. You may have this done starting at age 54. Mammogram. This may be done every 1-2 years. Talk to your health care provider about how often you should have regular mammograms. Talk with your health care provider about your test results, treatment options, and if necessary, the need for more tests. Vaccines  Your health care provider may recommend  certain vaccines, such as: Influenza vaccine. This is recommended every year. Tetanus, diphtheria, and acellular pertussis (Tdap, Td) vaccine. You may need a Td booster every 10 years. Zoster vaccine. You may need this after age 58. Pneumococcal 13-valent conjugate (PCV13) vaccine. One dose is recommended after age 41. Pneumococcal polysaccharide (PPSV23) vaccine. One dose is recommended after age 63. Talk to your health care provider about which screenings and vaccines you need and how often you need them. This information is not intended to replace advice given to you by your health care provider. Make sure you discuss any questions you have with your health care provider. Document Released: 03/23/2015 Document Revised: 11/14/2015 Document Reviewed: 12/26/2014 Elsevier Interactive Patient Education  2017 [place] Prevention in the Home Falls can cause injuries. They can happen to people of all ages. There are many things you can do to make your home safe and to help prevent falls. What can I do on the outside of my home? Regularly fix the edges of walkways and driveways and fix any cracks. Remove anything that might make you trip as you walk through a door, such as a raised step or threshold. Trim any bushes or trees on the path to your home. Use bright outdoor lighting. Clear any walking paths of anything that might make someone trip, such as rocks or tools. Regularly check to see if handrails are loose or broken. Make sure that both sides of any steps have handrails. Any raised decks and porches should have guardrails on the edges. Have any leaves, snow, or ice cleared regularly. Use sand or salt on walking paths during winter. Clean up any spills in your garage right away. This includes oil or grease spills. What can I do in the bathroom? Use night lights. Install grab bars by the toilet and in the tub and shower. Do not use towel bars as grab bars. Use non-skid mats or  decals in the tub or shower. If you need to sit down in the shower, use a plastic, non-slip stool. Keep the floor dry. Clean up any water that spills on the floor as soon as it happens. Remove soap buildup in the tub or shower regularly. Attach bath mats securely with double-sided non-slip rug tape. Do not have throw rugs and other things on the floor that can make you trip. What can I do in the bedroom? Use night lights. Make sure that you have a light by your bed that is easy to reach. Do not use any sheets or blankets that are too big for your bed. They should not hang down onto the floor. Have a firm chair that has side arms. You can use this for support while you get dressed. Do not have throw rugs and other things on the floor that can make you trip. What can I do in the kitchen? Clean up any spills right away. Avoid walking on wet floors. Keep items that you use a lot in easy-to-reach places. If you need to reach something above you, use a strong step  stool that has a grab bar. Keep electrical cords out of the way. Do not use floor polish or wax that makes floors slippery. If you must use wax, use non-skid floor wax. Do not have throw rugs and other things on the floor that can make you trip. What can I do with my stairs? Do not leave any items on the stairs. Make sure that there are handrails on both sides of the stairs and use them. Fix handrails that are broken or loose. Make sure that handrails are as long as the stairways. Check any carpeting to make sure that it is firmly attached to the stairs. Fix any carpet that is loose or worn. Avoid having throw rugs at the top or bottom of the stairs. If you do have throw rugs, attach them to the floor with carpet tape. Make sure that you have a light switch at the top of the stairs and the bottom of the stairs. If you do not have them, ask someone to add them for you. What else can I do to help prevent falls? Wear shoes that: Do not  have high heels. Have rubber bottoms. Are comfortable and fit you well. Are closed at the toe. Do not wear sandals. If you use a stepladder: Make sure that it is fully opened. Do not climb a closed stepladder. Make sure that both sides of the stepladder are locked into place. Ask someone to hold it for you, if possible. Clearly mark and make sure that you can see: Any grab bars or handrails. First and last steps. Where the edge of each step is. Use tools that help you move around (mobility aids) if they are needed. These include: Canes. Walkers. Scooters. Crutches. Turn on the lights when you go into a dark area. Replace any light bulbs as soon as they burn out. Set up your furniture so you have a clear path. Avoid moving your furniture around. If any of your floors are uneven, fix them. If there are any pets around you, be aware of where they are. Review your medicines with your doctor. Some medicines can make you feel dizzy. This can increase your chance of falling. Ask your doctor what other things that you can do to help prevent falls. This information is not intended to replace advice given to you by your health care provider. Make sure you discuss any questions you have with your health care provider. Document Released: 12/21/2008 Document Revised: 08/02/2015 Document Reviewed: 03/31/2014 Elsevier Interactive Patient Education  2017 [employer].

## 2022-04-09 ENCOUNTER — Other Ambulatory Visit: Payer: Self-pay | Admitting: Family Medicine

## 2022-04-09 DIAGNOSIS — E785 Hyperlipidemia, unspecified: Secondary | ICD-10-CM

## 2022-04-09 DIAGNOSIS — Z1321 Encounter for screening for nutritional disorder: Secondary | ICD-10-CM

## 2022-04-09 DIAGNOSIS — E538 Deficiency of other specified B group vitamins: Secondary | ICD-10-CM

## 2022-04-10 ENCOUNTER — Other Ambulatory Visit: Payer: Medicare PPO

## 2022-04-10 ENCOUNTER — Other Ambulatory Visit (INDEPENDENT_AMBULATORY_CARE_PROVIDER_SITE_OTHER): Payer: Medicare PPO

## 2022-04-10 DIAGNOSIS — Z1321 Encounter for screening for nutritional disorder: Secondary | ICD-10-CM

## 2022-04-10 DIAGNOSIS — E538 Deficiency of other specified B group vitamins: Secondary | ICD-10-CM

## 2022-04-10 DIAGNOSIS — E785 Hyperlipidemia, unspecified: Secondary | ICD-10-CM

## 2022-04-10 LAB — LIPID PANEL
Cholesterol: 168 mg/dL (ref 0–200)
HDL: 49 mg/dL (ref >39.00)
LDL Cholesterol: 95 mg/dL (ref 0–99)
NonHDL: 119.33
Total CHOL/HDL Ratio: 3
Triglycerides: 124 mg/dL (ref 0.0–149.0)
VLDL: 24.8 mg/dL (ref 0.0–40.0)

## 2022-04-10 LAB — COMPREHENSIVE METABOLIC PANEL
ALT: 12 U/L (ref 0–35)
AST: 15 U/L (ref 0–37)
Albumin: 4.1 g/dL (ref 3.5–5.2)
Alkaline Phosphatase: 82 U/L (ref 39–117)
BUN: 13 mg/dL (ref 6–23)
CO2: 30 mEq/L (ref 19–32)
Calcium: 8.8 mg/dL (ref 8.4–10.5)
Chloride: 103 mEq/L (ref 96–112)
Creatinine, Ser: 0.82 mg/dL (ref 0.40–1.20)
GFR: 71.41 mL/min (ref >60.00)
Glucose, Bld: 90 mg/dL (ref 70–99)
Potassium: 4.1 mEq/L (ref 3.5–5.1)
Sodium: 140 mEq/L (ref 135–145)
Total Bilirubin: 0.6 mg/dL (ref 0.2–1.2)
Total Protein: 6.1 g/dL (ref 6.0–8.3)

## 2022-04-10 LAB — VITAMIN D 25 HYDROXY (VIT D DEFICIENCY, FRACTURES): VITD: 17.92 ng/mL — ABNORMAL LOW (ref 30.00–100.00)

## 2022-04-10 LAB — VITAMIN B12: Vitamin B-12: 255 pg/mL (ref 211–911)

## 2022-04-15 ENCOUNTER — Ambulatory Visit: Payer: Medicare PPO

## 2022-04-17 ENCOUNTER — Ambulatory Visit (INDEPENDENT_AMBULATORY_CARE_PROVIDER_SITE_OTHER): Payer: Medicare PPO | Admitting: Family Medicine

## 2022-04-17 ENCOUNTER — Encounter: Payer: Self-pay | Admitting: Family Medicine

## 2022-04-17 VITALS — BP 122/64 | HR 94 | Temp 97.6°F | Ht 65.0 in | Wt 153.0 lb

## 2022-04-17 DIAGNOSIS — I517 Cardiomegaly: Secondary | ICD-10-CM | POA: Diagnosis not present

## 2022-04-17 DIAGNOSIS — L930 Discoid lupus erythematosus: Secondary | ICD-10-CM

## 2022-04-17 DIAGNOSIS — E538 Deficiency of other specified B group vitamins: Secondary | ICD-10-CM

## 2022-04-17 DIAGNOSIS — R351 Nocturia: Secondary | ICD-10-CM

## 2022-04-17 DIAGNOSIS — E559 Vitamin D deficiency, unspecified: Secondary | ICD-10-CM | POA: Diagnosis not present

## 2022-04-17 DIAGNOSIS — Z7189 Other specified counseling: Secondary | ICD-10-CM

## 2022-04-17 DIAGNOSIS — Z0000 Encounter for general adult medical examination without abnormal findings: Secondary | ICD-10-CM

## 2022-04-17 MED ORDER — CYANOCOBALAMIN 1000 MCG/ML IJ SOLN
1000.0000 ug | Freq: Once | INTRAMUSCULAR | Status: AC
  Administered 2022-04-17: 1000 ug via INTRAMUSCULAR

## 2022-04-17 MED ORDER — VITAMIN D (ERGOCALCIFEROL) 1.25 MG (50000 UNIT) PO CAPS: 50000.0000 [IU] | ORAL_CAPSULE | ORAL | 0 refills | Status: DC

## 2022-04-17 NOTE — Progress Notes (Signed)
B12 def.  Labs improved.  Dose given today at [facility].  Labs d/w pt.    Lupus per outside clinic.  Per dermatology.  I will defer.  She agrees.  H/o HLD.  H/o LVH.  Followed by cards.  D/w pt about getting f/u echo done.  No CP.    Nocturia hx noted.  No help with oxybutynin.  She thought sleep was disrupted by dreams, not nocturia as a primary issue.    She had recent URI with nausea and diarrhea. No fevers.  She is feeling better today, after 1 week.  She had neg covid test.    Tetanus 2015 Flu 2023 PNA up to date.  Shingles d/w pt.   Covid vaccine prev done.  RSV d/w pt.    Colonoscopy 2022 Breast cancer screening 2023 Pap not due.   DXA 2023, wnl.   Advance directive- husband designated if patient were incapacitated.   Lung cancer screening not due until summer 2023, d/w pt.   Vit d def d/w pt.  See orders and AVS.    Meds, vitals, and allergies reviewed.   ROS: Per HPI unless specifically indicated in ROS section   GEN: nad, alert and oriented HEENT: ncat NECK: supple w/o LA CV: rrr. PULM: ctab, no inc wob ABD: soft, +bs EXT: no edema SKIN: no acute rash  30 minutes were devoted to patient care in this encounter (this includes time spent reviewing the patient's file/history, interviewing and examining the patient, counseling/reviewing plan with patient).

## 2022-04-17 NOTE — Patient Instructions (Addendum)
Please check with cardiology if you don't get called about the follow up echo.   Take care.  Glad to see you.  Monthly B12 doses.  Recheck labs in about 3 months, after done with vitamin D Rx.   Update me as needed.

## 2022-04-20 DIAGNOSIS — E559 Vitamin D deficiency, unspecified: Secondary | ICD-10-CM | POA: Insufficient documentation

## 2022-04-20 NOTE — Assessment & Plan Note (Signed)
Start weekly vitamin D replacement and recheck level when done with that.

## 2022-04-20 NOTE — Assessment & Plan Note (Signed)
Tetanus 2015 Flu 2023 PNA up to date.  Shingles d/w pt.   Covid vaccine prev done.  RSV d/w pt.    Colonoscopy 2022 Breast cancer screening 2023 Pap not due.   DXA 2023, wnl.   Advance directive- husband designated if patient were incapacitated.   Lung cancer screening not due until summer 2023, d/w pt.

## 2022-04-20 NOTE — Assessment & Plan Note (Signed)
Per outside clinic.  I will defer.  She agrees.

## 2022-04-20 NOTE — Assessment & Plan Note (Signed)
In talking with the patient oxybutynin did not help a lot.  She thought that primary sleep disruption was the issue not nocturia.  Would not restart oxybutynin at this point.

## 2022-04-20 NOTE — Assessment & Plan Note (Signed)
Advance directive- husband designated if patient were incapacitated.  

## 2022-04-20 NOTE — Assessment & Plan Note (Signed)
Continue B12 replacement as is.

## 2022-04-20 NOTE — Assessment & Plan Note (Signed)
No chest pain.  Not short of breath.  Okay for outpatient follow-up.  Discussed her getting her follow-up echo done.

## 2022-05-02 DIAGNOSIS — H7293 Unspecified perforation of tympanic membrane, bilateral: Secondary | ICD-10-CM | POA: Diagnosis not present

## 2022-05-08 DIAGNOSIS — Z79899 Other long term (current) drug therapy: Secondary | ICD-10-CM | POA: Diagnosis not present

## 2022-05-15 ENCOUNTER — Ambulatory Visit: Payer: Medicare PPO

## 2022-05-20 ENCOUNTER — Ambulatory Visit (INDEPENDENT_AMBULATORY_CARE_PROVIDER_SITE_OTHER): Payer: Medicare PPO | Admitting: *Deleted

## 2022-05-20 DIAGNOSIS — E538 Deficiency of other specified B group vitamins: Secondary | ICD-10-CM

## 2022-05-20 MED ORDER — CYANOCOBALAMIN 1000 MCG/ML IJ SOLN
1000.0000 ug | Freq: Once | INTRAMUSCULAR | Status: AC
  Administered 2022-05-20: 1000 ug via INTRAMUSCULAR

## 2022-05-20 NOTE — Progress Notes (Signed)
Per orders of Dr. Duncan, injection of Vitamin B-12 given by Rayshaun Needle. Patient tolerated injection well. 

## 2022-06-22 ENCOUNTER — Ambulatory Visit
Admission: EM | Admit: 2022-06-22 | Discharge: 2022-06-22 | Disposition: A | Discharge status: Home / Self Care | Payer: Medicare PPO | Attending: Physician Assistant | Admitting: Physician Assistant

## 2022-06-22 DIAGNOSIS — J0120 Acute ethmoidal sinusitis, unspecified: Secondary | ICD-10-CM

## 2022-06-22 MED ORDER — CEFDINIR 300 MG PO CAPS: 300.0000 mg | ORAL_CAPSULE | Freq: Two times a day (BID) | ORAL | 0 refills | Status: DC

## 2022-06-22 NOTE — Discharge Instructions (Signed)
Return if any problems.

## 2022-06-22 NOTE — ED Triage Notes (Signed)
Pt presents with post nasal drainage, productive cough with colored mucous, sinus pain, and sore throat X 1 week.

## 2022-06-23 ENCOUNTER — Ambulatory Visit: Payer: Self-pay

## 2022-06-24 ENCOUNTER — Ambulatory Visit (INDEPENDENT_AMBULATORY_CARE_PROVIDER_SITE_OTHER): Payer: Medicare PPO | Admitting: *Deleted

## 2022-06-24 DIAGNOSIS — E538 Deficiency of other specified B group vitamins: Secondary | ICD-10-CM

## 2022-06-24 MED ORDER — CYANOCOBALAMIN 1000 MCG/ML IJ SOLN
1000.0000 ug | Freq: Once | INTRAMUSCULAR | Status: AC
  Administered 2022-06-24: 1000 ug via INTRAMUSCULAR

## 2022-06-24 NOTE — ED Provider Notes (Signed)
[facility]    CSN: [id number] Arrival date & time: 06/22/22  1150      History   Chief Complaint No chief complaint on file.   HPI Ashlee Allen is a 72 y.o. female.   Patient complains of nasal congestion and sinus drainage for the past week.  Patient reports that she has a history of   The history is provided by the patient. No language interpreter was used.    Past Medical History:  Diagnosis Date   Cardiac murmur    Discoid lupus    Dr. Azucena Cecil (prev seen at [facility]) and Dr. Nicholas Lose   Fibroid    GERD (gastroesophageal reflux disease)    controllled with weight loss    Hyperlipidemia    Pulmonary nodule     Patient Active Problem List   Diagnosis Date Noted   Vitamin D deficiency 04/20/2022   B12 deficiency 11/12/2021   Nocturia 11/11/2021   Cough 11/11/2021   LVH (left ventricular hypertrophy) 11/06/2020   Colon cancer screening 06/13/2020   Lower back pain 06/19/2019   Medicare annual wellness visit, subsequent 01/19/2019   Left arm pain 04/11/2018   Neck pain 02/22/2018   Rash 05/27/2017   Health care maintenance 01/14/2017   Dysuria 01/14/2017   Pulmonary nodule 04/23/2016   HLD (hyperlipidemia) 12/25/2015   Advance care planning 12/13/2014   Smoker 10/28/2012   Urinary incontinence 10/28/2012   Menopause 10/14/2010   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   GERD 01/15/2010   LUPUS ERYTHEMATOSUS, DISCOID 01/15/2010    Past Surgical History:  Procedure Laterality Date   ABDOMINAL HYSTERECTOMY  1997   TAH  [place]   BREAST SURGERY  2001   Duct excised-rt   CATARACT EXTRACTION, BILATERAL     CHOLECYSTECTOMY  1991   COLONOSCOPY     HAND SURGERY  2001   ctr-both   INNER EAR SURGERY     left- mastoidectomy   KNEE ARTHROSCOPY Left 05/20/2013   Procedure: LEFT KNEE ARTHROSCOPY WITH DEBRIDEMENT/SHAVING (CHONDROPLASTY) AND LATERAL AND MEDIAL MENISECTOMY, EXCISION OF PLICA;  Surgeon: Thera Flake., MD;  Location: [facility];  Service: Orthopedics;  Laterality: Left;   LAPAROSCOPIC APPENDECTOMY N/A 04/22/2016   Procedure: APPENDECTOMY LAPAROSCOPIC;  Surgeon: Ovidio Kin, MD;  Location: [facility];  Service: General;  Laterality: N/A;    OB History     Gravida  3   Para  2   Term  2   Preterm  0   AB  1   Living         SAB  0   IAB  0   Ectopic  0   Multiple      Live Births               Home Medications    Prior to Admission medications   Medication Sig Start Date End Date Taking? Authorizing Provider  cefdinir (OMNICEF) 300 MG capsule Take 1 capsule (300 mg total) by mouth 2 (two) times daily. 06/22/22  Yes Cheron Schaumann K, PA-C  albuterol (VENTOLIN HFA) 108 (90 Base) MCG/ACT inhaler Inhale 1-2 puffs into the lungs every 6 (six) hours as needed for wheezing or shortness of breath. 11/07/21   Joaquim Nam, MD  calcium carbonate (TUMS - DOSED IN MG ELEMENTAL CALCIUM) 500 MG chewable tablet Chew 1 tablet by mouth as needed for indigestion or heartburn.    [provider]  Chloramphenicol POWD Use as directed by ENT in the  ear. 11/07/21   Joaquim Nam, MD  cyanocobalamin (VITAMIN B12) 1000 MCG/ML injection IM monthly 02/13/22   Joaquim Nam, MD  hydroxychloroquine (PLAQUENIL) 200 MG tablet 1-2 tabs per day (wintertime 1 tab a day, summertime 2 tabs a day) 01/12/17   Joaquim Nam, MD  triamcinolone cream (KENALOG) 0.1 % Apply 1 application topically 2 (two) times daily. As Needed    [provider]  Vitamin D, Ergocalciferol, (DRISDOL) 1.25 MG (50000 UNIT) CAPS capsule Take 1 capsule (50,000 Units total) by mouth every 7 (seven) days. 04/17/22   Joaquim Nam, MD    Family History Family History  Problem Relation Age of Onset   Alcohol abuse Mother    Hyperlipidemia Mother    Hypertension Mother    Diabetes Mother    Cancer Mother        LUNG- SMOKER    Alcohol abuse Father    Cancer Father        lung   Seizures Brother    Colon  cancer Neg Hx    Breast cancer Neg Hx     Social History Social History   Tobacco Use   Smoking status: Every Day    Packs/day: 1.50    Years: 40.00    Additional pack years: 0.00    Total pack years: 60.00    Types: Cigarettes   Smokeless tobacco: Never  Vaping Use   Vaping Use: Never used  Substance Use Topics   Alcohol use: No   Drug use: No     Allergies   Latex, Lipitor [atorvastatin calcium], and Varenicline tartrate   Review of Systems Review of Systems  All other systems reviewed and are negative.    Physical Exam Triage Vital Signs ED Triage Vitals  Enc Vitals Group     BP 06/22/22 1356 124/65     Pulse Rate 06/22/22 1356 100     Resp 06/22/22 1356 17     Temp 06/22/22 1356 97.7 F (36.5 C)     Temp Source 06/22/22 1356 Oral     SpO2 06/22/22 1356 95 %     Weight --      Height --      Head Circumference --      Peak Flow --      Pain Score 06/22/22 1355 6     Pain Loc --      Pain Edu? --      Excl. in GC? --    No data found.  Updated Vital Signs BP 124/65 (BP Location: Right Arm)   Pulse 100   Temp 97.7 F (36.5 C) (Oral)   Resp 17   SpO2 95%   Visual Acuity Right Eye Distance:   Left Eye Distance:   Bilateral Distance:    Right Eye Near:   Left Eye Near:    Bilateral Near:     Physical Exam Vitals and nursing note reviewed.  Constitutional:      Appearance: She is well-developed.  HENT:     Head: Normocephalic.  Cardiovascular:     Rate and Rhythm: Normal rate and regular rhythm.  Pulmonary:     Effort: Pulmonary effort is normal.  Abdominal:     General: There is no distension.  Musculoskeletal:        General: Normal range of motion.     Cervical back: Normal range of motion.  Neurological:     Mental Status: She is alert and oriented to person, place,  and time.  Psychiatric:        Mood and Affect: Mood normal.      UC Treatments / Results  Labs (all labs ordered are listed, but only abnormal results are  displayed) Labs Reviewed - No data to display  EKG   Radiology No results found.  Procedures Procedures (including critical care time)  Medications Ordered in UC Medications - No data to display  Initial Impression / Assessment and Plan / UC Course  I have reviewed the triage vital signs and the nursing notes.  Pertinent labs & imaging results that were available during my care of the patient were reviewed by me and considered in my medical decision making (see chart for details).      Final Clinical Impressions(s) / UC Diagnoses   Final diagnoses:  Acute ethmoidal sinusitis, recurrence not specified     Discharge Instructions      Return if any problems.   ED Prescriptions     Medication Sig Dispense Auth. Provider   cefdinir (OMNICEF) 300 MG capsule Take 1 capsule (300 mg total) by mouth 2 (two) times daily. 30 capsule Elson Areas, [place]      PDMP not reviewed this encounter. An After Visit Summary was printed and given to the patient.    Elson Areas, [place] 06/24/22 [phone]

## 2022-06-24 NOTE — Progress Notes (Signed)
Per orders of Dr. Duncan, injection of Vitamin B-12 given by Tyrese Capriotti. Patient tolerated injection well. 

## 2022-06-26 DIAGNOSIS — H7293 Unspecified perforation of tympanic membrane, bilateral: Secondary | ICD-10-CM | POA: Diagnosis not present

## 2022-07-02 ENCOUNTER — Ambulatory Visit (HOSPITAL_COMMUNITY): Payer: Medicare PPO | Attending: Cardiology

## 2022-07-02 DIAGNOSIS — I517 Cardiomegaly: Secondary | ICD-10-CM | POA: Diagnosis not present

## 2022-07-02 LAB — ECHOCARDIOGRAM COMPLETE
AR max vel: 2.05 cm2
AV Area VTI: 2.08 cm2
AV Area mean vel: 2 cm2
AV Mean grad: 13.6 mmHg
AV Peak grad: 25 mmHg
Ao pk vel: 2.5 m/s
Area-P 1/2: 2.83 cm2
P 1/2 time: 461 msec
S' Lateral: 2.75 cm

## 2022-07-08 DIAGNOSIS — H7293 Unspecified perforation of tympanic membrane, bilateral: Secondary | ICD-10-CM | POA: Diagnosis not present

## 2022-07-09 DIAGNOSIS — L570 Actinic keratosis: Secondary | ICD-10-CM | POA: Diagnosis not present

## 2022-07-09 DIAGNOSIS — D485 Neoplasm of uncertain behavior of skin: Secondary | ICD-10-CM | POA: Diagnosis not present

## 2022-07-09 DIAGNOSIS — L930 Discoid lupus erythematosus: Secondary | ICD-10-CM | POA: Diagnosis not present

## 2022-07-09 DIAGNOSIS — L578 Other skin changes due to chronic exposure to nonionizing radiation: Secondary | ICD-10-CM | POA: Diagnosis not present

## 2022-07-09 DIAGNOSIS — L814 Other melanin hyperpigmentation: Secondary | ICD-10-CM | POA: Diagnosis not present

## 2022-07-24 ENCOUNTER — Ambulatory Visit (INDEPENDENT_AMBULATORY_CARE_PROVIDER_SITE_OTHER): Payer: Medicare PPO

## 2022-07-24 ENCOUNTER — Other Ambulatory Visit (INDEPENDENT_AMBULATORY_CARE_PROVIDER_SITE_OTHER): Payer: Medicare PPO

## 2022-07-24 DIAGNOSIS — E538 Deficiency of other specified B group vitamins: Secondary | ICD-10-CM

## 2022-07-24 DIAGNOSIS — E559 Vitamin D deficiency, unspecified: Secondary | ICD-10-CM

## 2022-07-24 LAB — VITAMIN B12: Vitamin B-12: 387 pg/mL (ref 211–911)

## 2022-07-24 LAB — VITAMIN D 25 HYDROXY (VIT D DEFICIENCY, FRACTURES): VITD: 44.62 ng/mL (ref 30.00–100.00)

## 2022-07-24 MED ORDER — CYANOCOBALAMIN 1000 MCG/ML IJ SOLN
1000.0000 ug | Freq: Once | INTRAMUSCULAR | Status: AC
  Administered 2022-07-24: 1000 ug via INTRAMUSCULAR

## 2022-07-24 NOTE — Progress Notes (Signed)
Per orders of Dr. Graham Duncan, injection of B-12 given by Can Lucci [place] in left deltoid. Patient tolerated injection well.   

## 2022-07-28 ENCOUNTER — Encounter: Payer: Self-pay | Admitting: Family Medicine

## 2022-07-28 ENCOUNTER — Ambulatory Visit: Payer: Medicare PPO | Admitting: Family Medicine

## 2022-07-28 VITALS — BP 104/64 | HR 65 | Temp 97.2°F | Ht 65.0 in | Wt 150.0 lb

## 2022-07-28 DIAGNOSIS — H9210 Otorrhea, unspecified ear: Secondary | ICD-10-CM | POA: Diagnosis not present

## 2022-07-28 MED ORDER — VITAMIN D3 50 MCG (2000 UT) PO CAPS: 2000.0000 [IU] | ORAL_CAPSULE | Freq: Every day | ORAL | Status: AC

## 2022-07-28 NOTE — Patient Instructions (Addendum)
Keep going with monthly B12 shots.  Please schedule on the way out.  I would change to 2000 units vit D daily.  OTC.   Take care.  Glad to see you.  Let me check with Dr. Pollyann Kennedy and keep using the medicine in the meantime.

## 2022-07-28 NOTE — Progress Notes (Unsigned)
B12 and D levels wnl.  D/w pt. Would continue B12 as is, monthly.   Would change to 2000 units vit D daily.  D/w pt.    L ear sx.  Had been seeing Dr. Pollyann Kennedy.  No FCNAVD.  She can't wear her hearing aid.  She has had recurrent intermittent ear drainage from the left ear.  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  She has been using chloramphenicol powder per ENT instructions.  She will occasionally have days without drainage.  No fevers  Meds, vitals, and allergies reviewed.   ROS: Per HPI unless specifically indicated in ROS section   Nad ncat She has dried yellowish material on the L TM.  She had discharge on the packing that she removed. Small perf seen.  R TM w/o erythema.  Perf seen.  Mastoids not ttp.    Discussed options, specifically me checking with ENT.

## 2022-07-29 MED ORDER — CIPROFLOXACIN-DEXAMETHASONE 0.3-0.1 % OT SUSP: 4.0000 [drp] | Freq: Two times a day (BID) | OTIC | 0 refills | Status: DC

## 2022-07-30 DIAGNOSIS — H9210 Otorrhea, unspecified ear: Secondary | ICD-10-CM | POA: Insufficient documentation

## 2022-07-30 NOTE — Assessment & Plan Note (Signed)
I called Dr. Pollyann Kennedy and I greatly appreciate his help, d/w pt about ciprodex use in the meantime.  I called the patient.  Would use Ciprodex for 1 week then stop.  Cycle back to chloramphenicol powder later if needed.  She can update me as needed.  I thank all involved.

## 2022-08-18 ENCOUNTER — Encounter: Payer: Self-pay | Admitting: Family Medicine

## 2022-08-22 ENCOUNTER — Other Ambulatory Visit: Payer: Self-pay | Admitting: Acute Care

## 2022-08-22 DIAGNOSIS — Z122 Encounter for screening for malignant neoplasm of respiratory organs: Secondary | ICD-10-CM

## 2022-08-22 DIAGNOSIS — Z87891 Personal history of nicotine dependence: Secondary | ICD-10-CM

## 2022-08-22 DIAGNOSIS — F17210 Nicotine dependence, cigarettes, uncomplicated: Secondary | ICD-10-CM

## 2022-08-25 DIAGNOSIS — H900 Conductive hearing loss, bilateral: Secondary | ICD-10-CM | POA: Diagnosis not present

## 2022-08-25 DIAGNOSIS — H7293 Unspecified perforation of tympanic membrane, bilateral: Secondary | ICD-10-CM | POA: Diagnosis not present

## 2022-08-27 ENCOUNTER — Ambulatory Visit (INDEPENDENT_AMBULATORY_CARE_PROVIDER_SITE_OTHER): Payer: Medicare PPO

## 2022-08-27 DIAGNOSIS — E538 Deficiency of other specified B group vitamins: Secondary | ICD-10-CM

## 2022-08-27 MED ORDER — CYANOCOBALAMIN 1000 MCG/ML IJ SOLN
1000.0000 ug | Freq: Once | INTRAMUSCULAR | Status: AC
Start: 2022-08-27 — End: 2022-08-27
  Administered 2022-08-27: 1000 ug via INTRAMUSCULAR

## 2022-08-27 NOTE — Progress Notes (Signed)
Per orders of Dr. Crawford Givens who is out of office and Dr Eustaquio Boyden who is in office, injection of vitamin b 12 given by Lewanda Rife in right deltoid. Patient tolerated injection well. Patient will make appointment for 1 month.

## 2022-08-28 ENCOUNTER — Telehealth: Payer: Self-pay

## 2022-08-28 NOTE — Patient Outreach (Signed)
  Care Coordination   08/28/2022 Name: SHANNA BRUDER MRN: [id number] DOB: 02-13-1951   Care Coordination Outreach Attempts:  An unsuccessful telephone outreach was attempted today to offer the patient information about available care coordination services.  Contact answering phone requested patient be called on her mobile number.  Call attempted to patient's mobile number.  Unable to reach. HIPAA compliant message left.   Follow Up Plan:  Additional outreach attempts will be made to offer the patient care coordination information and services.   Encounter Outcome:  No Answer   Care Coordination Interventions:  No, not indicated    George Ina [facility] Care Coordination [phone] direct line

## 2022-09-24 ENCOUNTER — Ambulatory Visit
Admission: RE | Admit: 2022-09-24 | Discharge: 2022-09-24 | Disposition: A | Discharge status: Home / Self Care | Payer: Medicare PPO | Source: Ambulatory Visit | Attending: Acute Care | Admitting: Acute Care

## 2022-09-24 DIAGNOSIS — F17210 Nicotine dependence, cigarettes, uncomplicated: Secondary | ICD-10-CM | POA: Diagnosis not present

## 2022-09-24 DIAGNOSIS — Z122 Encounter for screening for malignant neoplasm of respiratory organs: Secondary | ICD-10-CM

## 2022-09-24 DIAGNOSIS — Z87891 Personal history of nicotine dependence: Secondary | ICD-10-CM

## 2022-10-01 ENCOUNTER — Ambulatory Visit (INDEPENDENT_AMBULATORY_CARE_PROVIDER_SITE_OTHER): Payer: Medicare PPO

## 2022-10-01 DIAGNOSIS — E538 Deficiency of other specified B group vitamins: Secondary | ICD-10-CM | POA: Diagnosis not present

## 2022-10-01 MED ORDER — CYANOCOBALAMIN 1000 MCG/ML IJ SOLN
1000.0000 ug | Freq: Once | INTRAMUSCULAR | Status: AC
Start: 2022-10-01 — End: 2022-10-01
  Administered 2022-10-01: 1000 ug via INTRAMUSCULAR

## 2022-10-01 NOTE — Progress Notes (Signed)
Per orders of Dr. Crawford Givens, who is out of office and Dr Sharen Hones who is in office injection of vitamin b 12 given by Lewanda Rife in left deltoid. Patient tolerated injection well. Patient will make appointment for 1 month.

## 2022-10-08 ENCOUNTER — Encounter (INDEPENDENT_AMBULATORY_CARE_PROVIDER_SITE_OTHER): Payer: Self-pay

## 2022-10-16 ENCOUNTER — Telehealth: Payer: Self-pay | Admitting: *Deleted

## 2022-10-16 DIAGNOSIS — Z87891 Personal history of nicotine dependence: Secondary | ICD-10-CM

## 2022-10-16 DIAGNOSIS — R911 Solitary pulmonary nodule: Secondary | ICD-10-CM

## 2022-10-16 NOTE — Telephone Encounter (Signed)
Spoke with pt and reviewed lung screening CT results from 09/24/22. Lung nodules noted that have grown and then decreased in size over time. We recommend doing a 3 month nodule follow up CT. PT verbalized understanding.  Coronary calcifications noted. Pt had Echo 06/2022. CT results/ plans faxed to PCP. Order placed for 3 month nodule f/u lung screening CT.

## 2022-10-20 DIAGNOSIS — L603 Nail dystrophy: Secondary | ICD-10-CM | POA: Diagnosis not present

## 2022-10-20 DIAGNOSIS — L821 Other seborrheic keratosis: Secondary | ICD-10-CM | POA: Diagnosis not present

## 2022-10-20 DIAGNOSIS — L814 Other melanin hyperpigmentation: Secondary | ICD-10-CM | POA: Diagnosis not present

## 2022-10-20 DIAGNOSIS — L820 Inflamed seborrheic keratosis: Secondary | ICD-10-CM | POA: Diagnosis not present

## 2022-10-20 DIAGNOSIS — L309 Dermatitis, unspecified: Secondary | ICD-10-CM | POA: Diagnosis not present

## 2022-11-05 ENCOUNTER — Ambulatory Visit (INDEPENDENT_AMBULATORY_CARE_PROVIDER_SITE_OTHER): Payer: Medicare PPO

## 2022-11-05 DIAGNOSIS — E538 Deficiency of other specified B group vitamins: Secondary | ICD-10-CM

## 2022-11-05 MED ORDER — CYANOCOBALAMIN 1000 MCG/ML IJ SOLN
1000.0000 ug | Freq: Once | INTRAMUSCULAR | Status: AC
  Administered 2022-11-05: 1000 ug via INTRAMUSCULAR

## 2022-11-05 NOTE — Progress Notes (Signed)
Per orders of Dr. Loleta Books is out of office and Dr Eustaquio Boyden who is in the office injection of vitamin b 12 given by Lewanda Rife in right deltoid.Patient tolerated injection well. Patient will make appointment for 1 month.

## 2022-11-21 ENCOUNTER — Encounter: Payer: Self-pay | Admitting: Family Medicine

## 2022-11-21 ENCOUNTER — Ambulatory Visit: Payer: Medicare PPO | Admitting: Family Medicine

## 2022-11-21 VITALS — BP 120/82 | HR 90 | Temp 98.7°F | Ht 65.0 in | Wt 153.0 lb

## 2022-11-21 DIAGNOSIS — Z23 Encounter for immunization: Secondary | ICD-10-CM | POA: Diagnosis not present

## 2022-11-21 DIAGNOSIS — M542 Cervicalgia: Secondary | ICD-10-CM | POA: Diagnosis not present

## 2022-11-21 DIAGNOSIS — K219 Gastro-esophageal reflux disease without esophagitis: Secondary | ICD-10-CM | POA: Diagnosis not present

## 2022-11-21 MED ORDER — BETAMETHASONE DIPROPIONATE 0.05 % EX CREA: TOPICAL_CREAM | Freq: Two times a day (BID) | CUTANEOUS | Status: AC | PRN

## 2022-11-21 MED ORDER — PREDNISONE 20 MG PO TABS: ORAL_TABLET | ORAL | 0 refills | Status: DC

## 2022-11-21 MED ORDER — OMEPRAZOLE 20 MG PO CPDR: 20.0000 mg | DELAYED_RELEASE_CAPSULE | Freq: Every day | ORAL | 1 refills | Status: DC

## 2022-11-21 NOTE — Patient Instructions (Addendum)
Try omeprazole 20mg  a day for 1 month.  If doing well, then try to taper 1 pill per week.  Update me as needed.  Can still use TUMS in the meantime.   Elevate the head of your bed.    Prednisone with food.  Update me as needed.  Take care.  Glad to see you.

## 2022-11-21 NOTE — Progress Notes (Unsigned)
She had RSV vaccine 08/17/22.    Flu shot today.   Prev BLE edema improved in the meantime.  More swelling with heat exposure, sitting with leg dependent, inc salt intake.  Clearly better now.  Not SOB.  She can update me as needed.    Back/neck pain. Had taken some ibuprofen but not daily.  NSAID cautions d/w pt.  H/o neck pain.  Can radiate down the L arm.  Sx going on for months.  She has prev gone to PT, has the exercises at home to restart.    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 No acute disc protrusion.  GERD.  TUMS helped.  Not a new sx.  She limits eating late. Doesn't lay down right after eating.  Worse after eating pasta, woke up with burning in the throat, had to cough.  She can elevate the head of her bed.  No CP with walking.     Fortunately no ear drainage in the meantime.  Meds, vitals, and allergies reviewed.   ROS: Per HPI unless specifically indicated in ROS section   Nad Ncat Neck supple, no LA RRR with SEM noted.  Ctab L side of neck sore with ROM.  Abdomen soft.  Nontender. No BLE edema.    25 minutes were devoted to patient care in this encounter (this includes time spent reviewing the patient's file/history, interviewing and examining the patient, counseling/reviewing plan with patient).

## 2022-11-23 NOTE — Assessment & Plan Note (Signed)
Restart prednisone with routine steroid cautions and update me as needed.  She agrees to plan.  Restart home exercise program

## 2022-11-23 NOTE — Assessment & Plan Note (Signed)
Discussed options.  Reasonable to try omeprazole 20mg  a day for 1 month.  If doing well, then try to taper 1 pill per week.  Update me as needed.  Can still use TUMS in the meantime.   Elevate the head of bed.

## 2022-12-10 ENCOUNTER — Ambulatory Visit (INDEPENDENT_AMBULATORY_CARE_PROVIDER_SITE_OTHER): Payer: Medicare PPO

## 2022-12-10 DIAGNOSIS — E538 Deficiency of other specified B group vitamins: Secondary | ICD-10-CM | POA: Diagnosis not present

## 2022-12-10 MED ORDER — CYANOCOBALAMIN 1000 MCG/ML IJ SOLN
1000.0000 ug | Freq: Once | INTRAMUSCULAR | Status: AC
  Administered 2022-12-10: 1000 ug via INTRAMUSCULAR

## 2022-12-10 NOTE — Progress Notes (Signed)
Per orders of Dr Crawford Givens who is out of office and Dr. Eustaquio Boyden, who is in office injection of vitamin b 12 given by Lewanda Rife in left deltoid. Patient tolerated injection well. Patient will make appointment for 1 month.

## 2022-12-26 ENCOUNTER — Ambulatory Visit
Admission: RE | Admit: 2022-12-26 | Discharge: 2022-12-26 | Disposition: A | Discharge status: Home / Self Care | Payer: Medicare PPO | Source: Ambulatory Visit | Attending: Family Medicine | Admitting: Family Medicine

## 2022-12-26 DIAGNOSIS — Z87891 Personal history of nicotine dependence: Secondary | ICD-10-CM

## 2022-12-26 DIAGNOSIS — J439 Emphysema, unspecified: Secondary | ICD-10-CM | POA: Diagnosis not present

## 2022-12-26 DIAGNOSIS — I700 Atherosclerosis of aorta: Secondary | ICD-10-CM | POA: Diagnosis not present

## 2022-12-26 DIAGNOSIS — F17210 Nicotine dependence, cigarettes, uncomplicated: Secondary | ICD-10-CM | POA: Diagnosis not present

## 2022-12-26 DIAGNOSIS — R911 Solitary pulmonary nodule: Secondary | ICD-10-CM

## 2023-01-14 ENCOUNTER — Ambulatory Visit (INDEPENDENT_AMBULATORY_CARE_PROVIDER_SITE_OTHER): Payer: Medicare PPO

## 2023-01-14 ENCOUNTER — Other Ambulatory Visit: Payer: Self-pay | Admitting: Emergency Medicine

## 2023-01-14 DIAGNOSIS — E538 Deficiency of other specified B group vitamins: Secondary | ICD-10-CM

## 2023-01-14 DIAGNOSIS — Z87891 Personal history of nicotine dependence: Secondary | ICD-10-CM

## 2023-01-14 DIAGNOSIS — Z122 Encounter for screening for malignant neoplasm of respiratory organs: Secondary | ICD-10-CM

## 2023-01-14 DIAGNOSIS — F17210 Nicotine dependence, cigarettes, uncomplicated: Secondary | ICD-10-CM

## 2023-01-14 MED ORDER — CYANOCOBALAMIN 1000 MCG/ML IJ SOLN
1000.0000 ug | Freq: Once | INTRAMUSCULAR | Status: AC
Start: 2023-01-14 — End: 2023-01-14
  Administered 2023-01-14: 1000 ug via INTRAMUSCULAR

## 2023-01-14 NOTE — Progress Notes (Signed)
Per orders of Dr. Crawford Givens who is out of office and Dr Eustaquio Boyden who is in office, injection of vitamin b 12 given by Lewanda Rife in right deltoid. Patient tolerated injection well. Patient will make appointment for 1 month.

## 2023-01-19 DIAGNOSIS — L821 Other seborrheic keratosis: Secondary | ICD-10-CM | POA: Diagnosis not present

## 2023-01-19 DIAGNOSIS — L814 Other melanin hyperpigmentation: Secondary | ICD-10-CM | POA: Diagnosis not present

## 2023-01-19 DIAGNOSIS — D1801 Hemangioma of skin and subcutaneous tissue: Secondary | ICD-10-CM | POA: Diagnosis not present

## 2023-01-19 DIAGNOSIS — L930 Discoid lupus erythematosus: Secondary | ICD-10-CM | POA: Diagnosis not present

## 2023-01-22 DIAGNOSIS — Z1231 Encounter for screening mammogram for malignant neoplasm of breast: Secondary | ICD-10-CM | POA: Diagnosis not present

## 2023-01-22 LAB — HM MAMMOGRAPHY

## 2023-01-23 ENCOUNTER — Encounter: Payer: Self-pay | Admitting: Family Medicine

## 2023-02-01 ENCOUNTER — Ambulatory Visit
Admission: RE | Admit: 2023-02-01 | Discharge: 2023-02-01 | Disposition: A | Discharge status: Home / Self Care | Payer: Medicare PPO | Source: Ambulatory Visit | Attending: Physician Assistant | Admitting: Physician Assistant

## 2023-02-01 VITALS — BP 131/71 | HR 72 | Temp 97.7°F | Resp 20 | Wt 155.0 lb

## 2023-02-01 DIAGNOSIS — J019 Acute sinusitis, unspecified: Secondary | ICD-10-CM | POA: Insufficient documentation

## 2023-02-01 DIAGNOSIS — N3000 Acute cystitis without hematuria: Secondary | ICD-10-CM | POA: Diagnosis not present

## 2023-02-01 LAB — POCT URINALYSIS DIP (MANUAL ENTRY)
Bilirubin, UA: NEGATIVE
Blood, UA: NEGATIVE
Glucose, UA: NEGATIVE mg/dL
Ketones, POC UA: NEGATIVE mg/dL
Nitrite, UA: NEGATIVE
Protein Ur, POC: NEGATIVE mg/dL
Spec Grav, UA: >=1.03 — AB (ref 1.010–1.025)
Urobilinogen, UA: 0.2 U/dL
pH, UA: 6 (ref 5.0–8.0)

## 2023-02-01 MED ORDER — AMOXICILLIN-POT CLAVULANATE 875-125 MG PO TABS: 1.0000 | ORAL_TABLET | Freq: Two times a day (BID) | ORAL | 0 refills | Status: DC

## 2023-02-01 NOTE — ED Triage Notes (Signed)
Nasal congestion, cough, sinus pressure. Frequent and difficult urination. Reports symptoms progressing x 2 weeks.1 1/2ppd smoker. Denies fevers.

## 2023-02-03 LAB — URINE CULTURE: Culture: 50000 — AB

## 2023-02-07 NOTE — ED Provider Notes (Signed)
[facility]    CSN: [id number] Arrival date & time: 02/01/23  8295      History   Chief Complaint Chief Complaint  Patient presents with   Cough    Sinus drainage and possible UTI - Entered by patient   Nasal Congestion    HPI Ashlee Allen is a 72 y.o. female.   Patient here today for evaluation of nasal congestion, cough, sinus pressure that started about 2 weeks ago.  She also reports she has had some frequent and difficult urination.  Symptoms have seemed to progress since onset.  She has not had any fevers.  She is currently a daily smoker.  She does not report any vomiting.  The history is provided by the patient.  Cough Associated symptoms: sore throat   Associated symptoms: no chills, no ear pain, no eye discharge, no fever, no shortness of breath and no wheezing     Past Medical History:  Diagnosis Date   Cardiac murmur    Discoid lupus    Dr. Azucena Cecil (prev seen at [facility]) and Dr. Nicholas Lose   Fibroid    GERD (gastroesophageal reflux disease)    controllled with weight loss    Hyperlipidemia    Pulmonary nodule     Patient Active Problem List   Diagnosis Date Noted   Ear drainage 07/30/2022   Vitamin D deficiency 04/20/2022   B12 deficiency 11/12/2021   Nocturia 11/11/2021   Cough 11/11/2021   LVH (left ventricular hypertrophy) 11/06/2020   Colon cancer screening 06/13/2020   Medicare annual wellness visit, subsequent 01/19/2019   Laryngopharyngeal reflux (LPR) 07/19/2018   Left arm pain 04/11/2018   Neck pain 02/22/2018   Rash 05/27/2017   Health care maintenance 01/14/2017   Dysuria 01/14/2017   Pulmonary nodule 04/23/2016   Advance care planning 12/13/2014   Smoker 10/28/2012   Urinary incontinence 10/28/2012   Menopause 10/14/2010   GERD 01/15/2010   LUPUS ERYTHEMATOSUS, DISCOID 01/15/2010    Past Surgical History:  Procedure Laterality Date   ABDOMINAL HYSTERECTOMY  1997   TAH  [place]   BREAST SURGERY  2001   Duct excised-rt    CATARACT EXTRACTION, BILATERAL     CHOLECYSTECTOMY  1991   COLONOSCOPY     HAND SURGERY  2001   ctr-both   INNER EAR SURGERY     left- mastoidectomy   KNEE ARTHROSCOPY Left 05/20/2013   Procedure: LEFT KNEE ARTHROSCOPY WITH DEBRIDEMENT/SHAVING (CHONDROPLASTY) AND LATERAL AND MEDIAL MENISECTOMY, EXCISION OF PLICA;  Surgeon: Thera Flake., MD;  Location: [facility];  Service: Orthopedics;  Laterality: Left;   LAPAROSCOPIC APPENDECTOMY N/A 04/22/2016   Procedure: APPENDECTOMY LAPAROSCOPIC;  Surgeon: Ovidio Kin, MD;  Location: [facility];  Service: General;  Laterality: N/A;    OB History     Gravida  3   Para  2   Term  2   Preterm  0   AB  1   Living         SAB  0   IAB  0   Ectopic  0   Multiple      Live Births               Home Medications    Prior to Admission medications   Medication Sig Start Date End Date Taking? Authorizing Provider  amoxicillin-clavulanate (AUGMENTIN) 875-125 MG tablet Take 1 tablet by mouth every 12 (twelve) hours. 02/01/23  Yes Tomi Bamberger, PA-C  betamethasone dipropionate 0.05 %  cream Apply topically 2 (two) times daily as needed. 11/21/22   Joaquim Nam, MD  Cholecalciferol (VITAMIN D3) 50 MCG (2000 UT) capsule Take 1 capsule (2,000 Units total) by mouth daily. 07/28/22   Joaquim Nam, MD  ciprofloxacin-dexamethasone [facility]) OTIC suspension Place 4 drops into the left ear 2 (two) times daily. Patient not taking: Reported on 11/21/2022 07/29/22   Joaquim Nam, MD  cyanocobalamin (VITAMIN B12) 1000 MCG/ML injection IM monthly 02/13/22   Joaquim Nam, MD  hydroxychloroquine (PLAQUENIL) 200 MG tablet 1-2 tabs per day (wintertime 1 tab a day, summertime 2 tabs a day) 01/12/17   Joaquim Nam, MD  omeprazole (PRILOSEC) 20 MG capsule Take 1 capsule (20 mg total) by mouth daily. 11/21/22   Joaquim Nam, MD  predniSONE (DELTASONE) 20 MG tablet Take 2 a day for 5 days, then 1 a day for 5  days, with food. Don't take with aleve/ibuprofen. 11/21/22   Joaquim Nam, MD    Family History Family History  Problem Relation Age of Onset   Alcohol abuse Mother    Hyperlipidemia Mother    Hypertension Mother    Diabetes Mother    Cancer Mother        LUNG- SMOKER    Alcohol abuse Father    Cancer Father        lung   Seizures Brother    Colon cancer Neg Hx    Breast cancer Neg Hx     Social History Social History   Tobacco Use   Smoking status: Every Day    Current packs/day: 1.50    Average packs/day: 1.5 packs/day for 40.0 years (60.0 ttl pk-yrs)    Types: Cigarettes   Smokeless tobacco: Never  Vaping Use   Vaping status: Never Used  Substance Use Topics   Alcohol use: No   Drug use: No     Allergies   Atorvastatin calcium, Latex, Lipitor [atorvastatin calcium], and Varenicline tartrate   Review of Systems Review of Systems  Constitutional:  Negative for chills and fever.  HENT:  Positive for congestion, sinus pressure and sore throat. Negative for ear pain.   Eyes:  Negative for discharge and redness.  Respiratory:  Positive for cough. Negative for shortness of breath and wheezing.   Gastrointestinal:  Negative for abdominal pain, diarrhea, nausea and vomiting.  Genitourinary:  Positive for dysuria and frequency.     Physical Exam Triage Vital Signs ED Triage Vitals  Encounter Vitals Group     BP 02/01/23 0932 131/71     Systolic BP Percentile --      Diastolic BP Percentile --      Pulse Rate 02/01/23 0932 72     Resp 02/01/23 0932 20     Temp 02/01/23 0932 97.7 F (36.5 C)     Temp Source 02/01/23 0932 Oral     SpO2 02/01/23 0932 96 %     Weight 02/01/23 0935 155 lb (70.3 kg)     Height --      Head Circumference --      Peak Flow --      Pain Score 02/01/23 0935 5     Pain Loc --      Pain Education --      Exclude from Growth Chart --    No data found.  Updated Vital Signs BP 131/71 (BP Location: Left Arm)   Pulse 72   Temp  97.7 F (36.5 C) (Oral)  Resp 20   Wt 155 lb (70.3 kg)   SpO2 96%   BMI 25.79 kg/m   Visual Acuity Right Eye Distance:   Left Eye Distance:   Bilateral Distance:    Right Eye Near:   Left Eye Near:    Bilateral Near:     Physical Exam Vitals and nursing note reviewed.  Constitutional:      General: She is not in acute distress.    Appearance: Normal appearance. She is not ill-appearing.  HENT:     Head: Normocephalic and atraumatic.     Nose: Congestion present.     Mouth/Throat:     Mouth: Mucous membranes are moist.     Pharynx: No oropharyngeal exudate or posterior oropharyngeal erythema.  Eyes:     Conjunctiva/sclera: Conjunctivae normal.  Cardiovascular:     Rate and Rhythm: Normal rate and regular rhythm.     Heart sounds: Normal heart sounds. No murmur heard. Pulmonary:     Effort: Pulmonary effort is normal. No respiratory distress.     Breath sounds: Normal breath sounds. No wheezing, rhonchi or rales.  Skin:    General: Skin is warm and dry.  Neurological:     Mental Status: She is alert.  Psychiatric:        Mood and Affect: Mood normal.        Thought Content: Thought content normal.      UC Treatments / Results  Labs (all labs ordered are listed, but only abnormal results are displayed) Labs Reviewed  URINE CULTURE - Abnormal; Notable for the following components:      Result Value   Culture 50,000 COLONIES/mL KLEBSIELLA PNEUMONIAE (*)    Organism ID, Bacteria KLEBSIELLA PNEUMONIAE (*)    All other components within normal limits  POCT URINALYSIS DIP (MANUAL ENTRY) - Abnormal; Notable for the following components:   Clarity, UA cloudy (*)    Spec Grav, UA >=1.030 (*)    Leukocytes, UA Trace (*)    All other components within normal limits    EKG   Radiology No results found.  Procedures Procedures (including critical care time)  Medications Ordered in UC Medications - No data to display  Initial Impression / Assessment and Plan  / UC Course  I have reviewed the triage vital signs and the nursing notes.  Pertinent labs & imaging results that were available during my care of the patient were reviewed by me and considered in my medical decision making (see chart for details).    Will treat to cover sinusitis with Augmentin which should also cover cystitis as well.  Urine culture ordered.  Recommended follow-up if no gradual improvement or with any further concerns.  Final Clinical Impressions(s) / UC Diagnoses   Final diagnoses:  Acute cystitis without hematuria  Acute sinusitis, recurrence not specified, unspecified location   Discharge Instructions   None    ED Prescriptions     Medication Sig Dispense Auth. Provider   amoxicillin-clavulanate (AUGMENTIN) 875-125 MG tablet Take 1 tablet by mouth every 12 (twelve) hours. 14 tablet Tomi Bamberger, PA-C      PDMP not reviewed this encounter.   Tomi Bamberger, PA-C 02/07/23 [phone]

## 2023-02-10 DIAGNOSIS — H906 Mixed conductive and sensorineural hearing loss, bilateral: Secondary | ICD-10-CM | POA: Diagnosis not present

## 2023-02-18 ENCOUNTER — Ambulatory Visit (INDEPENDENT_AMBULATORY_CARE_PROVIDER_SITE_OTHER): Payer: Medicare PPO

## 2023-02-18 DIAGNOSIS — E538 Deficiency of other specified B group vitamins: Secondary | ICD-10-CM | POA: Diagnosis not present

## 2023-02-18 MED ORDER — CYANOCOBALAMIN 1000 MCG/ML IJ SOLN
1000.0000 ug | Freq: Once | INTRAMUSCULAR | Status: AC
  Administered 2023-02-18: 1000 ug via INTRAMUSCULAR

## 2023-02-18 NOTE — Progress Notes (Signed)
Per orders of Dr. Ria Bush, injection of B12 given by Pat Kocher in left deltoid. Patient tolerated injection well. Patient will make appointment for 1 month.

## 2023-03-25 ENCOUNTER — Ambulatory Visit (INDEPENDENT_AMBULATORY_CARE_PROVIDER_SITE_OTHER): Payer: Medicare PPO

## 2023-03-25 DIAGNOSIS — E538 Deficiency of other specified B group vitamins: Secondary | ICD-10-CM | POA: Diagnosis not present

## 2023-03-25 MED ORDER — CYANOCOBALAMIN 1000 MCG/ML IJ SOLN
1000.0000 ug | Freq: Once | INTRAMUSCULAR | Status: AC
  Administered 2023-03-25: 1000 ug via INTRAMUSCULAR

## 2023-03-25 NOTE — Progress Notes (Signed)
Per orders of Dr. Eustaquio Boyden who is in office and Dr Crawford Givens who is out of office, injection of vitamin b 12 given by Lewanda Rife in right deltoid. Patient tolerated injection well. Patient will make appointment for 1 month.

## 2023-03-27 ENCOUNTER — Encounter: Payer: Self-pay | Admitting: Family Medicine

## 2023-03-27 ENCOUNTER — Ambulatory Visit: Payer: Medicare PPO | Admitting: Family Medicine

## 2023-03-27 VITALS — BP 104/68 | HR 90 | Temp 98.0°F | Ht 63.5 in | Wt 150.2 lb

## 2023-03-27 DIAGNOSIS — J329 Chronic sinusitis, unspecified: Secondary | ICD-10-CM

## 2023-03-27 MED ORDER — ALBUTEROL SULFATE HFA 108 (90 BASE) MCG/ACT IN AERS: 1.0000 | INHALATION_SPRAY | Freq: Four times a day (QID) | RESPIRATORY_TRACT | 1 refills | Status: AC | PRN

## 2023-03-27 MED ORDER — AMOXICILLIN-POT CLAVULANATE 875-125 MG PO TABS: 1.0000 | ORAL_TABLET | Freq: Two times a day (BID) | ORAL | 0 refills | Status: DC

## 2023-03-27 NOTE — Patient Instructions (Signed)
Rest and fluids. Use albuterol if needed.  If the sinus pain is clearly getting worse then start augmentin and update [place] as needed.  Take care.  Glad to see you.

## 2023-03-27 NOTE — Progress Notes (Unsigned)
duration of symptoms: about 1 week.   Started with ST, then that resolved. Then cough and rhinorrhea.  Sx are slowly getting better, as of the last 24 hours.    R max sinus pain.  Her R ear was mildly aching yesterday.    Some cough, attributed to post nasal gtt.  No ear drainage now.  Some clear sputum, improving in the meantime.    Neg covid test yesterday at home.   Per HPI unless specifically indicated in ROS section   Meds, vitals, and allergies reviewed.   GEN: nad, alert and oriented HEENT: mucous membranes moist, TM B with chronic changes but w/o erythema, OP with cobblestoning NECK: supple w/o LA CV: rrr. PULM: ctab, no inc wob ABD: soft, +bs EXT: no edema R max sinus ttp

## 2023-03-29 DIAGNOSIS — J329 Chronic sinusitis, unspecified: Secondary | ICD-10-CM | POA: Insufficient documentation

## 2023-03-29 NOTE — Assessment & Plan Note (Signed)
Rest and fluids. Use albuterol if needed.  If the sinus pain is clearly getting worse then start augmentin and update [place] as needed.

## 2023-04-06 ENCOUNTER — Ambulatory Visit (INDEPENDENT_AMBULATORY_CARE_PROVIDER_SITE_OTHER): Payer: Medicare PPO

## 2023-04-06 VITALS — Ht 65.0 in | Wt 150.0 lb

## 2023-04-06 DIAGNOSIS — Z0000 Encounter for general adult medical examination without abnormal findings: Secondary | ICD-10-CM

## 2023-04-06 NOTE — Patient Instructions (Signed)
Ashlee Allen , Thank you for taking time to come for your Medicare Wellness Visit. I appreciate your ongoing commitment to your health goals. Please review the following plan we discussed and let me know if I can assist you in the future.   Referrals/Orders/Follow-Ups/Clinician Recommendations:    This is a list of the screening recommended for you and due dates:  Health Maintenance  Topic Date Due   DTaP/Tdap/Td vaccine (3 - Td or Tdap) 04/29/2023   Colon Cancer Screening  12/21/2023   Screening for Lung Cancer  12/26/2023   Medicare Annual Wellness Visit  04/05/2024   Mammogram  01/21/2025   Pneumonia Vaccine  Completed   Flu Shot  Completed   DEXA scan (bone density measurement)  Completed   Hepatitis C Screening  Completed   Zoster (Shingles) Vaccine  Completed   HPV Vaccine  Aged Out   COVID-19 Vaccine  Discontinued    Advanced directives: (In Chart) A copy of your advanced directives are scanned into your chart should your provider ever need it.  Next Medicare Annual Wellness Visit scheduled for next year: Yes 04/06/2024 @ 2:20pm televisit

## 2023-04-06 NOTE — Progress Notes (Signed)
Subjective:   Ashlee Allen is a 73 y.o. female who presents for Medicare Annual (Subsequent) preventive examination.  Visit Complete: Virtual I connected with  Lendon Ka on 04/06/23 by a audio enabled telemedicine application and verified that I am speaking with the correct person using two identifiers.  Patient Location: Home  Provider Location: Home Office  I discussed the limitations of evaluation and management by telemedicine. The patient expressed understanding and agreed to proceed.  Vital Signs: Because this visit was a virtual/telehealth visit, some criteria may be missing or patient reported. Any vitals not documented were not able to be obtained and vitals that have been documented are patient reported.  Patient Medicare AWV questionnaire was completed by the patient on 04/03/2023; I have confirmed that all information answered by patient is correct and no changes since this date.  Cardiac Risk Factors include: advanced age (>77men, >47 women);sedentary lifestyle    Objective:    Today's Vitals   04/06/23 1422  Weight: 150 lb (68 kg)  Height: 5\' 5"  (1.651 m)  PainSc: 0-No pain   Body mass index is 24.96 kg/m.     04/06/2023    2:32 PM 04/03/2022   11:31 AM 04/02/2021   11:17 AM 01/12/2019    2:00 PM 01/06/2018   11:52 AM 01/02/2017    2:06 PM 04/22/2016    3:00 AM  Advanced Directives  Does Patient Have a Medical Advance Directive? Yes Yes Yes Yes Yes Yes Yes  Type of Estate agent of [place];Living will Healthcare Power of [place];Living will Healthcare Power of [place];Living will Healthcare Power of [place];Living will Healthcare Power of [place];Living will Healthcare Power of [place];Living will Living will  Does patient want to make changes to medical advance directive?  No - Patient declined Yes (MAU/Ambulatory/Procedural Areas - Information given)    No - Patient declined  Copy of Healthcare Power of Attorney in Chart? Yes -  validated most recent copy scanned in chart (See row information) Yes - validated most recent copy scanned in chart (See row information)  No - copy requested No - copy requested No - copy requested     Current Medications (verified) Outpatient Encounter Medications as of 04/06/2023  Medication Sig   albuterol (VENTOLIN HFA) 108 (90 Base) MCG/ACT inhaler Inhale 1-2 puffs into the lungs every 6 (six) hours as needed for wheezing or shortness of breath.   amoxicillin-clavulanate (AUGMENTIN) 875-125 MG tablet Take 1 tablet by mouth 2 (two) times daily.   betamethasone dipropionate 0.05 % cream Apply topically 2 (two) times daily as needed.   Cholecalciferol (VITAMIN D3) 50 MCG (2000 UT) capsule Take 1 capsule (2,000 Units total) by mouth daily.   cyanocobalamin (VITAMIN B12) 1000 MCG/ML injection IM monthly   hydroxychloroquine (PLAQUENIL) 200 MG tablet 1-2 tabs per day (wintertime 1 tab a day, summertime 2 tabs a day)   No facility-administered encounter medications on file as of 04/06/2023.    Allergies (verified) Atorvastatin calcium, Latex, Lipitor [atorvastatin calcium], and Varenicline tartrate   History: Past Medical History:  Diagnosis Date   Cardiac murmur    Discoid lupus    Dr. Azucena Cecil (prev seen at [facility]) and Dr. Nicholas Lose   Fibroid    GERD (gastroesophageal reflux disease)    controllled with weight loss    Hyperlipidemia    Pulmonary nodule    Past Surgical History:  Procedure Laterality Date   ABDOMINAL HYSTERECTOMY  1997   TAH  [place]   BREAST SURGERY  2001   Duct excised-rt   CATARACT EXTRACTION, BILATERAL     CHOLECYSTECTOMY  1991   COLONOSCOPY     HAND SURGERY  2001   ctr-both   INNER EAR SURGERY     left- mastoidectomy   KNEE ARTHROSCOPY Left 05/20/2013   Procedure: LEFT KNEE ARTHROSCOPY WITH DEBRIDEMENT/SHAVING (CHONDROPLASTY) AND LATERAL AND MEDIAL MENISECTOMY, EXCISION OF PLICA;  Surgeon: Thera Flake., MD;  Location: [facility];   Service: Orthopedics;  Laterality: Left;   LAPAROSCOPIC APPENDECTOMY N/A 04/22/2016   Procedure: APPENDECTOMY LAPAROSCOPIC;  Surgeon: Ovidio Kin, MD;  Location: [facility];  Service: General;  Laterality: N/A;   Family History  Problem Relation Age of Onset   Alcohol abuse Mother    Hyperlipidemia Mother    Hypertension Mother    Diabetes Mother    Cancer Mother        LUNG- SMOKER    Alcohol abuse Father    Cancer Father        lung   Seizures Brother    Colon cancer Neg Hx    Breast cancer Neg Hx    Social History   Socioeconomic History   Marital status: Married    Spouse name: Not on file   Number of children: 2   Years of education: Not on file   Highest education level: Bachelor's degree (e.g., BA, AB, BS)  Occupational History   Occupation: Retired Administrator, arts: STATE EMPLOYEE  Tobacco Use   Smoking status: Every Day    Current packs/day: 1.50    Average packs/day: 1.5 packs/day for 40.0 years (60.0 ttl pk-yrs)    Types: Cigarettes   Smokeless tobacco: Never  Vaping Use   Vaping status: Never Used  Substance and Sexual Activity   Alcohol use: No   Drug use: No   Sexual activity: Not on file  Other Topics Concern   Not on file  Social History Narrative    Walking for exercise   Married 1978   Retired from Social research officer, government   3 grandkids as of 2024   Social Drivers of Health   Financial Resource Strain: Low Risk  (04/06/2023)   Overall Financial Resource Strain (CARDIA)    Difficulty of Paying Living Expenses: Not hard at all  Food Insecurity: No Food Insecurity (04/06/2023)   Hunger Vital Sign    Worried About Running Out of Food in the Last Year: Never true    Ran Out of Food in the Last Year: Never true  Transportation Needs: No Transportation Needs (04/06/2023)   PRAPARE - Administrator, Civil Service (Medical): No    Lack of Transportation (Non-Medical): No  Physical Activity: Inactive (04/06/2023)   Exercise Vital Sign    Days  of Exercise per Week: 0 days    Minutes of Exercise per Session: 0 min  Stress: No Stress Concern Present (04/06/2023)   [employer] of Occupational Health - Occupational Stress Questionnaire    Feeling of Stress : Only a little  Social Connections: Moderately Integrated (04/06/2023)   Social Connection and Isolation Panel [NHANES]    Frequency of Communication with Friends and Family: More than three times a week    Frequency of Social Gatherings with Friends and Family: Twice a week    Attends Religious Services: 1 to 4 times per year    Active Member of [employer] or Organizations: No    Attends Banker Meetings: Never    Marital Status: Married  Tobacco Counseling Ready to quit: Not Answered Counseling given: Not Answered   Clinical Intake:  Pre-visit preparation completed: Yes  Pain : No/denies pain Pain Score: 0-No pain   BMI - recorded: 24.96 Nutritional Status: BMI of 19-24  Normal Nutritional Risks: None Diabetes: No  How often do you need to have someone help you when you read instructions, pamphlets, or other written materials from your doctor or pharmacy?: 1 - Never  Interpreter Needed?: No  Comments: lives with husband Information entered by :: B.Moselle Rister,LPN   Activities of Daily Living    04/03/2023    6:30 PM  In your present state of health, do you have any difficulty performing the following activities:  Hearing? 1  Vision? 0  Difficulty concentrating or making decisions? 0  Walking or climbing stairs? 0  Dressing or bathing? 0  Doing errands, shopping? 0  Preparing Food and eating ? N  Using the Toilet? N  In the past six months, have you accidently leaked urine? Y  Do you have problems with loss of bowel control? N  Managing your Medications? N  Managing your Finances? N  Housekeeping or managing your Housekeeping? N    Patient Care Team: Joaquim Nam, MD as PCP - General [place], Heather, OD as Referring Physician  (Optometry)  Indicate any recent Medical Services you may have received from other than [facility] providers in the past year (date may be approximate).     Assessment:   This is a routine wellness examination for Ashlee Allen.  Hearing/Vision screen Hearing Screening - Comments:: Pt says her hearing is good with aids Vision Screening - Comments:: Pt says her vision is good after cataract surgery; readers only  Dr Heather [place]   Goals Addressed             This Visit's Progress    Increase physical activity   Not on track    Starting 01/06/2018, I will continue to walking for 20-30 min daily.      Maintain healthy lifestyle   Not on track    Stay active with husband at the [facility] 3 days weekly     COMPLETED: Patient Stated   On track    01/12/2019, I will maintain and continue medications as prescribed.        Depression Screen    04/06/2023    2:27 PM 03/27/2023   12:03 PM 11/21/2022    2:54 PM 07/28/2022    9:30 AM 04/17/2022    8:10 AM 04/03/2022   11:29 AM 04/02/2021   11:20 AM  PHQ 2/9 Scores  PHQ - 2 Score 0 0 0 0 0 0 0  PHQ- 9 Score  0 0 0 3      Fall Risk    04/03/2023    6:30 PM 03/27/2023   12:03 PM 11/21/2022    2:54 PM 07/28/2022    9:29 AM 04/17/2022    8:10 AM  Fall Risk   Falls in the past year? 0 0 0 0 0  Number falls in past yr:  0 0 0 0  Injury with Fall? 0 0 0 0 0  Risk for fall due to : No Fall Risks No Fall Risks No Fall Risks No Fall Risks No Fall Risks  Follow up Education provided;Falls prevention discussed Falls evaluation completed Falls evaluation completed Falls evaluation completed Falls evaluation completed    MEDICARE RISK AT HOME: Medicare Risk at Home Any stairs in or around the home?: (Patient-Rptd) Yes If  so, are there any without handrails?: (Patient-Rptd) No Home free of loose throw rugs in walkways, pet beds, electrical cords, etc?: (Patient-Rptd) No Adequate lighting in your home to reduce risk of falls?: (Patient-Rptd) Yes Life alert?:  (Patient-Rptd) No Use of a cane, walker or w/c?: (Patient-Rptd) No Grab bars in the bathroom?: (Patient-Rptd) Yes Shower chair or bench in shower?: (Patient-Rptd) Yes Elevated toilet seat or a handicapped toilet?: (Patient-Rptd) No  TIMED UP AND GO:  Was the test performed?  No    Cognitive Function:    01/12/2019    2:04 PM 01/06/2018   11:43 AM 01/02/2017    2:22 PM 12/19/2015    8:37 AM  MMSE - Mini Mental State Exam  Orientation to time 5 5 5 5   Orientation to Place 5 5 5 5   Registration 3 3 3 3   Attention/ Calculation 5 0 0 0  Recall 3 3 3 3   Language- name 2 objects  0 0 0  Language- repeat 1 1 1 1   Language- follow 3 step command  3 3 3   Language- read & follow direction  0 0 0  Write a sentence  0 0 0  Copy design  0 0 0  Total score  20 20 20         04/06/2023    2:33 PM 04/03/2022   11:29 AM  6CIT Screen  What Year? 0 points 0 points  What month? 0 points 0 points  What time? 0 points 0 points  Count back from 20 0 points 0 points  Months in reverse 0 points 0 points  Repeat phrase 0 points 0 points  Total Score 0 points 0 points    Immunizations Immunization History  Administered Date(s) Administered   Fluad Quad(high Dose 65+) 11/19/2018, 11/27/2021   Fluad Trivalent(High Dose 65+) 11/21/2022   Hepatitis B, PED/ADOLESCENT 05/16/1991, 06/14/1991, 11/21/1991   Influenza Split 12/25/2010, 12/17/2011, 12/02/2012   Influenza Whole 01/08/2010, 12/17/2011   Influenza, High Dose Seasonal PF 01/02/2017, 12/08/2017   Influenza,inj,Quad PF,6+ Mos 12/11/2014, 12/19/2015   Influenza-Unspecified 12/07/2013, 12/08/2017, 12/28/2020   Moderna Covid-19 Fall Seasonal Vaccine 63yrs & older 12/27/2022   PFIZER(Purple Top)SARS-COV-2 Vaccination 03/30/2019, 04/20/2019, 11/17/2019, 08/08/2020   Pfizer Covid-19 Vaccine Bivalent Booster 42yrs & up 01/04/2021   Pneumococcal Conjugate-13 12/11/2014   Pneumococcal Polysaccharide-23 12/19/2015   Respiratory Syncytial Virus  Vaccine,Recomb Aduvanted(Arexvy) 08/17/2022   Td 08/24/2007   Tdap 04/28/2013   Zoster Recombinant(Shingrix) 01/18/2019, 03/22/2019    TDAP status: Up to date  Flu Vaccine status: Up to date  Pneumococcal vaccine status: Up to date  Covid-19 vaccine status: Completed vaccines  Qualifies for Shingles Vaccine? Yes   Zostavax completed Yes   Shingrix Completed?: Yes  Screening Tests Health Maintenance  Topic Date Due   DTaP/Tdap/Td (3 - Td or Tdap) 04/29/2023   Colonoscopy  12/21/2023   Lung Cancer Screening  12/26/2023   Medicare Annual Wellness (AWV)  04/05/2024   MAMMOGRAM  01/21/2025   Pneumonia Vaccine 59+ Years old  Completed   INFLUENZA VACCINE  Completed   DEXA SCAN  Completed   Hepatitis C Screening  Completed   Zoster Vaccines- Shingrix  Completed   HPV VACCINES  Aged Out   COVID-19 Vaccine  Discontinued    Health Maintenance  There are no preventive care reminders to display for this patient.   Colorectal cancer screening: Type of screening: Colonoscopy. Completed 12/20/2020. Repeat every 3-10 years  Mammogram status: Completed 01/22/2023. Repeat every year  Bone Density status:  Completed 09/30/2021. Results reflect: Bone density results: NORMAL. Repeat every 5 years.  Lung Cancer Screening: (Low Dose CT Chest recommended if Age 5-80 years, 20 pack-year currently smoking OR have quit w/in 15years.) does qualify.   Lung Cancer Screening Referral: no-pt has in place   Additional Screening:  Hepatitis C Screening: does not qualify; Completed 10/28/2012  Vision Screening: Recommended annual ophthalmology exams for early detection of glaucoma and other disorders of the eye. Is the patient up to date with their annual eye exam?  Yes  Who is the provider or what is the name of the office in which the patient attends annual eye exams? Dr [place] If pt is not established with a provider, would they like to be referred to a provider to establish care? No .    Dental Screening: Recommended annual dental exams for proper oral hygiene  Diabetic Foot Exam: n/a  Community Resource Referral / Chronic Care Management: CRR required this visit?  No   CCM required this visit?  No    Plan:     I have personally reviewed and noted the following in the patient's chart:   Medical and social history Use of alcohol, tobacco or illicit drugs  Current medications and supplements including opioid prescriptions. Patient is not currently taking opioid prescriptions. Functional ability and status Nutritional status Physical activity Advanced directives List of other physicians Hospitalizations, surgeries, and ER visits in previous 12 months Vitals Screenings to include cognitive, depression, and falls Referrals and appointments  In addition, I have reviewed and discussed with patient certain preventive protocols, quality metrics, and best practice recommendations. A written personalized care plan for preventive services as well as general preventive health recommendations were provided to patient.     Sue Lush, LPN   1/61/0960   After Visit Summary: (MyChart) Due to this being a telephonic visit, the after visit summary with patients personalized plan was offered to patient via MyChart   Nurse Notes: The patient states she is doing well and has no concerns or questions at this time.

## 2023-04-12 ENCOUNTER — Other Ambulatory Visit: Payer: Self-pay | Admitting: Family Medicine

## 2023-04-12 DIAGNOSIS — E785 Hyperlipidemia, unspecified: Secondary | ICD-10-CM

## 2023-04-12 DIAGNOSIS — E559 Vitamin D deficiency, unspecified: Secondary | ICD-10-CM

## 2023-04-12 DIAGNOSIS — E538 Deficiency of other specified B group vitamins: Secondary | ICD-10-CM

## 2023-04-16 ENCOUNTER — Other Ambulatory Visit: Payer: Medicare PPO

## 2023-04-22 ENCOUNTER — Other Ambulatory Visit (INDEPENDENT_AMBULATORY_CARE_PROVIDER_SITE_OTHER): Payer: Medicare PPO

## 2023-04-22 DIAGNOSIS — E785 Hyperlipidemia, unspecified: Secondary | ICD-10-CM

## 2023-04-22 DIAGNOSIS — E538 Deficiency of other specified B group vitamins: Secondary | ICD-10-CM | POA: Diagnosis not present

## 2023-04-22 DIAGNOSIS — E559 Vitamin D deficiency, unspecified: Secondary | ICD-10-CM | POA: Diagnosis not present

## 2023-04-22 LAB — CBC WITH DIFFERENTIAL/PLATELET
Basophils Absolute: 0 10*3/uL (ref 0.0–0.1)
Basophils Relative: 0.7 % (ref 0.0–3.0)
Eosinophils Absolute: 0.1 10*3/uL (ref 0.0–0.7)
Eosinophils Relative: 1.8 % (ref 0.0–5.0)
HCT: 40.5 % (ref 36.0–46.0)
Hemoglobin: 14 g/dL (ref 12.0–15.0)
Lymphocytes Relative: 22.1 % (ref 12.0–46.0)
Lymphs Abs: 0.8 10*3/uL (ref 0.7–4.0)
MCHC: 34.5 g/dL (ref 30.0–36.0)
MCV: 92.3 fL (ref 78.0–100.0)
Monocytes Absolute: 0.4 10*3/uL (ref 0.1–1.0)
Monocytes Relative: 10.4 % (ref 3.0–12.0)
Neutro Abs: 2.2 10*3/uL (ref 1.4–7.7)
Neutrophils Relative %: 65 % (ref 43.0–77.0)
Platelets: 167 10*3/uL (ref 150.0–400.0)
RBC: 4.39 Mil/uL (ref 3.87–5.11)
RDW: 13.4 % (ref 11.5–15.5)
WBC: 3.4 10*3/uL — ABNORMAL LOW (ref 4.0–10.5)

## 2023-04-22 LAB — COMPREHENSIVE METABOLIC PANEL
ALT: 22 U/L (ref 0–35)
AST: 21 U/L (ref 0–37)
Albumin: 4 g/dL (ref 3.5–5.2)
Alkaline Phosphatase: 77 U/L (ref 39–117)
BUN: 13 mg/dL (ref 6–23)
CO2: 30 meq/L (ref 19–32)
Calcium: 8.7 mg/dL (ref 8.4–10.5)
Chloride: 103 meq/L (ref 96–112)
Creatinine, Ser: 0.67 mg/dL (ref 0.40–1.20)
GFR: 86.62 mL/min (ref >60.00)
Glucose, Bld: 92 mg/dL (ref 70–99)
Potassium: 3.6 meq/L (ref 3.5–5.1)
Sodium: 139 meq/L (ref 135–145)
Total Bilirubin: 0.5 mg/dL (ref 0.2–1.2)
Total Protein: 6.5 g/dL (ref 6.0–8.3)

## 2023-04-22 LAB — LIPID PANEL
Cholesterol: 132 mg/dL (ref 0–200)
HDL: 44.3 mg/dL (ref >39.00)
LDL Cholesterol: 59 mg/dL (ref 0–99)
NonHDL: 87.66
Total CHOL/HDL Ratio: 3
Triglycerides: 142 mg/dL (ref 0.0–149.0)
VLDL: 28.4 mg/dL (ref 0.0–40.0)

## 2023-04-22 LAB — VITAMIN B12: Vitamin B-12: 667 pg/mL (ref 211–911)

## 2023-04-22 LAB — VITAMIN D 25 HYDROXY (VIT D DEFICIENCY, FRACTURES): VITD: 54.83 ng/mL (ref 30.00–100.00)

## 2023-04-23 ENCOUNTER — Telehealth: Payer: Self-pay | Admitting: Family Medicine

## 2023-04-23 ENCOUNTER — Ambulatory Visit (INDEPENDENT_AMBULATORY_CARE_PROVIDER_SITE_OTHER): Payer: Medicare PPO | Admitting: Family Medicine

## 2023-04-23 ENCOUNTER — Encounter: Payer: Self-pay | Admitting: Family Medicine

## 2023-04-23 VITALS — BP 108/64 | HR 82 | Temp 97.9°F | Ht 64.5 in | Wt 150.2 lb

## 2023-04-23 DIAGNOSIS — E538 Deficiency of other specified B group vitamins: Secondary | ICD-10-CM

## 2023-04-23 DIAGNOSIS — N898 Other specified noninflammatory disorders of vagina: Secondary | ICD-10-CM

## 2023-04-23 DIAGNOSIS — Z7189 Other specified counseling: Secondary | ICD-10-CM

## 2023-04-23 DIAGNOSIS — Z0000 Encounter for general adult medical examination without abnormal findings: Secondary | ICD-10-CM

## 2023-04-23 DIAGNOSIS — I2510 Atherosclerotic heart disease of native coronary artery without angina pectoris: Secondary | ICD-10-CM

## 2023-04-23 MED ORDER — CYANOCOBALAMIN 1000 MCG/ML IJ SOLN
1000.0000 ug | Freq: Once | INTRAMUSCULAR | Status: AC
  Administered 2023-04-23: 1000 ug via INTRAMUSCULAR

## 2023-04-23 MED ORDER — ESTROGENS CONJUGATED 0.625 MG/GM VA CREA: TOPICAL_CREAM | VAGINAL | 3 refills | Status: DC

## 2023-04-23 NOTE — Telephone Encounter (Signed)
Please schedule B12 injections out for the year. Thank you

## 2023-04-23 NOTE — Progress Notes (Signed)
Recent aches, presumed viral infection, resolved in the meantime.  Husband had similar, he is better.  Pt still has some cough.  Not SOB.  Tetanus 2015 Flu 2024 PNA up to date.  Shingles d/w pt.   Covid vaccine prev done.  RSV prev done.   Colonoscopy 2022 Breast cancer screening 2023 Pap not due.   DXA 2023, wnl.   Advance directive- husband designated if patient were incapacitated.    Lung cancer screening not due until summer 2023, d/w pt.   B12 def, on replacement.  Labs d/w pt.    Vaginal dryness.  Pain with intercourse, even with lubricants.  No bleeding.  No discharge.  D/w pt about options.    Still seeing dermatology at baseline.    She had lung cancer screening.  CAD noted.  Statin intolerant.  No CP.   Discussed smoking cessation.  Family stressors d/w pt.  Son is dealing with a divorce.    Meds, vitals, and allergies reviewed.   ROS: Per HPI unless specifically indicated in ROS section   GEN: nad, alert and oriented HEENT: mucous membranes moist NECK: supple w/o LA CV: rrr.  PULM: no inc wob. Scattered rhonchi.  Cleared with a cough.  No focal decrease in breath sounds ABD: soft, +bs EXT: no edema SKIN: well perfused.   35 minutes were devoted to patient care in this encounter (this includes time spent reviewing the patient's file/history, interviewing and examining the patient, counseling/reviewing plan with patient).

## 2023-04-23 NOTE — Progress Notes (Signed)
Per orders of Dr. Crawford Givens, injection of B-12 given by Leonor Liv in left deltoid. Patient tolerated injection well. Patient will make appointment for 1 month.

## 2023-04-23 NOTE — Telephone Encounter (Signed)
Patient wanted to schedule monthly b12 shots throught end of year please advise if we may do that or if we need to have lab or follow up in between.

## 2023-04-23 NOTE — Telephone Encounter (Signed)
Given her trend, I would check it yearly.  Should be okay to continue with monthly injections.  Thanks.

## 2023-04-23 NOTE — Patient Instructions (Signed)
B12 shot today.  Continue monthly B12 shots.  Keep taking vitamin D.  Try vaginal estrogen.  Take care.  Glad to see you.

## 2023-04-26 DIAGNOSIS — I2510 Atherosclerotic heart disease of native coronary artery without angina pectoris: Secondary | ICD-10-CM | POA: Insufficient documentation

## 2023-04-26 DIAGNOSIS — N898 Other specified noninflammatory disorders of vagina: Secondary | ICD-10-CM | POA: Insufficient documentation

## 2023-04-26 NOTE — Assessment & Plan Note (Signed)
B12 def, on replacement.  Labs d/w pt.  Continue as is.

## 2023-04-26 NOTE — Assessment & Plan Note (Signed)
Vaginal dryness.  Pain with intercourse, even with lubricants.  No bleeding.  No discharge.  D/w pt about options.   Could try vaginal estrogen daily for 1 week then twice a week afterward.  Routine cautions given to patient.  Should be okay to use.

## 2023-04-26 NOTE — Assessment & Plan Note (Signed)
Advance directive- husband designated if patient were incapacitated.  

## 2023-04-26 NOTE — Assessment & Plan Note (Addendum)
Tetanus 2015 Flu 2024 PNA up to date.  Shingles d/w pt.   Covid vaccine prev done.  RSV prev done.   Colonoscopy 2022 Breast cancer screening 2023 Pap not due.   DXA 2023, wnl.   Advance directive- husband designated if patient were incapacitated.   Lung cancer screening not due until summer 2023, d/w pt.

## 2023-04-26 NOTE — Assessment & Plan Note (Signed)
Statin intolerant.  No CP.   Discussed smoking cessation.

## 2023-04-27 ENCOUNTER — Other Ambulatory Visit: Payer: Self-pay | Admitting: Family Medicine

## 2023-04-27 MED ORDER — ESTROGENS CONJUGATED 0.625 MG/GM VA CREA: TOPICAL_CREAM | VAGINAL | Status: DC

## 2023-04-28 ENCOUNTER — Other Ambulatory Visit: Payer: Self-pay

## 2023-04-28 ENCOUNTER — Telehealth: Payer: Self-pay | Admitting: Family Medicine

## 2023-04-28 MED ORDER — ESTROGENS CONJUGATED 0.625 MG/GM VA CREA: TOPICAL_CREAM | VAGINAL | 0 refills | Status: DC

## 2023-04-28 NOTE — Telephone Encounter (Signed)
This should last ~42 doses, so ~18 weeks total.  Thanks.

## 2023-04-28 NOTE — Telephone Encounter (Unsigned)
Copied from CRM [id number]. Topic: Clinical - Prescription Issue >> Apr 28, 2023 10:16 AM Tiffany H wrote: Reason for CRM: conjugated estrogens (PREMARIN) vaginal cream    Use 1 gram daily for 1 week then twice a week afterward  Pharmacy requested clarification about how dosing over what duration. Please clarify how long this current quantity is supposed to last on prescription for pharmacy.   [facility] [place], [place] - [address] [place] [address] Phone: [phone]  Fax: [phone] DEA #: -- DAW Reason: --

## 2023-05-21 ENCOUNTER — Ambulatory Visit (INDEPENDENT_AMBULATORY_CARE_PROVIDER_SITE_OTHER): Payer: Medicare PPO

## 2023-05-21 DIAGNOSIS — E538 Deficiency of other specified B group vitamins: Secondary | ICD-10-CM | POA: Diagnosis not present

## 2023-05-21 MED ORDER — CYANOCOBALAMIN 1000 MCG/ML IJ SOLN
1000.0000 ug | Freq: Once | INTRAMUSCULAR | Status: AC
  Administered 2023-05-21: 1000 ug via INTRAMUSCULAR

## 2023-05-21 NOTE — Progress Notes (Signed)
Per orders of Dr. Duncan, injection of monthly B12 1000 mcg/ml given by Fruma [place] P Makesha Belitz, CMA in Right Deltoid. Patient tolerated injection well.  

## 2023-05-28 IMAGING — MR MR CARD MORPHOLOGY WO/W CM
46 of 48 series · 46 of 48 positions shown · IV contrast (Gadavist)
Comparison: none

CLINICAL DATA: Severe LVH on echo

EXAM:
CARDIAC MRI
TECHNIQUE: The patient was scanned on a 1.5 Tesla Siemens magnet. A dedicated
cardiac coil was used. Functional imaging was done using Fiesta
sequences. [DATE], and 4 chamber views were done to assess for RWMA's.
Modified Vinh rule using a short axis stack was used to
calculate an ejection fraction on a dedicated work station using
Circle software. The patient received 11 cc of Gadavist. After 10
minutes inversion recovery sequences were used to assess for
infiltration and scar tissue.
CONTRAST:  11 cc  of Gadavist

[Series 4: [id number]_bh · axial · 8.0mm · 1.41mm/px · 1 of 16 slices shown]
[im 1/16]
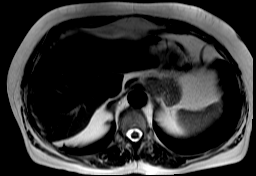

[Series 8: bSSFP · oblique · 8.0mm · 1.61mm/px · 1 of 25 slices shown (1 of 20)]
[im 1/25]
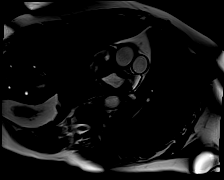

[Series 9: bSSFP · oblique · 8.0mm · 1.61mm/px · 1 of 25 slices shown (2 of 20)]
[im 1/25]
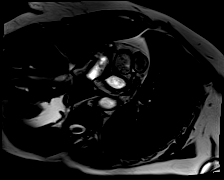

[Series 10: bSSFP · oblique · 8.0mm · 1.61mm/px · 1 of 25 slices shown (3 of 20)]
[im 1/25]
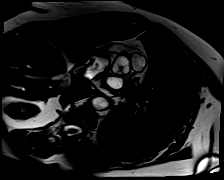

[Series 11: bSSFP · oblique · 8.0mm · 1.61mm/px · 1 of 25 slices shown (4 of 20)]
[im 1/25]
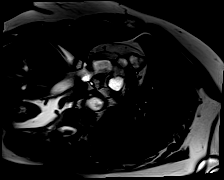

[Series 12: bSSFP · oblique · 8.0mm · 1.61mm/px · 1 of 25 slices shown (5 of 20)]
[im 1/25]
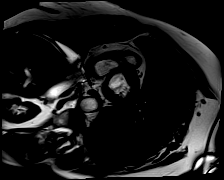

[Series 13: bSSFP · oblique · 8.0mm · 1.61mm/px · 1 of 25 slices shown (6 of 20)]
[im 1/25]
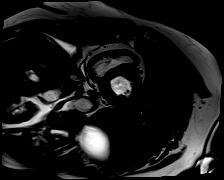

[Series 14: bSSFP · oblique · 8.0mm · 1.61mm/px · 1 of 25 slices shown (7 of 20)]
[im 1/25]
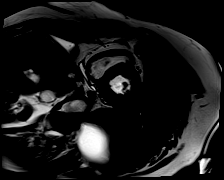

[Series 15: bSSFP · oblique · 8.0mm · 1.61mm/px · 1 of 25 slices shown (8 of 20)]
[im 1/25]
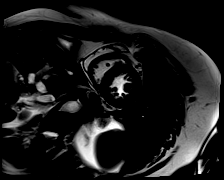

[Series 16: bSSFP · oblique · 8.0mm · 1.61mm/px · 1 of 25 slices shown (9 of 20)]
[im 1/25]
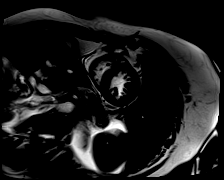

[Series 17: bSSFP · oblique · 8.0mm · 1.61mm/px · 1 of 25 slices shown (10 of 20)]
[im 1/25]
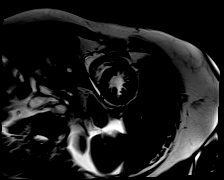

[Series 18: bSSFP · oblique · 8.0mm · 1.61mm/px · 1 of 25 slices shown (11 of 20)]
[im 1/25]
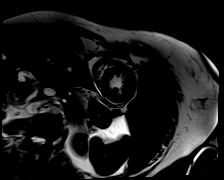

[Series 19: bSSFP · oblique · 8.0mm · 1.61mm/px · 1 of 25 slices shown (12 of 20)]
[im 1/25]
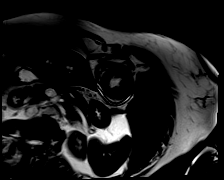

[Series 20: bSSFP · oblique · 8.0mm · 1.61mm/px · 1 of 25 slices shown (13 of 20)]
[im 1/25]
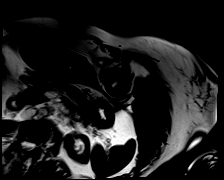

[Series 21: bSSFP · oblique · 8.0mm · 1.61mm/px · 1 of 25 slices shown (14 of 20)]
[im 1/25]
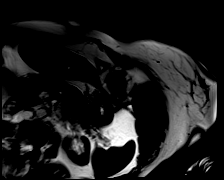

[Series 22: bSSFP · oblique · 8.0mm · 1.61mm/px · 1 of 25 slices shown (15 of 20)]
[im 1/25]
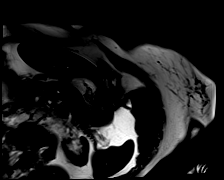

[Series 23: bSSFP · oblique · 8.0mm · 1.61mm/px · 1 of 25 slices shown (16 of 20)]
[im 1/25]
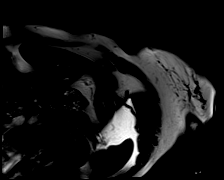

[Series 24: (id)_long_t1 · oblique · 8.0mm · 1.56mm/px · 1 of 24 slices shown]
[im 1/24]
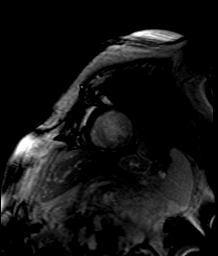

[Series 25: (id)_long_t1_moco · oblique · 8.0mm · 1.56mm/px · 1 of 24 slices shown]
[im 1/24]
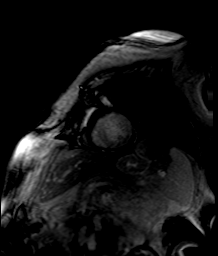

[Series 28: (id)_trufi · oblique · 8.0mm · 2.08mm/px · 1 of 9 slices shown]
[im 1/9]
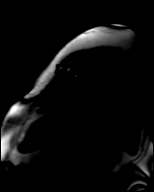

[Series 29: (id)_trufi_moco · oblique · 8.0mm · 2.08mm/px · 1 of 9 slices shown]
[im 1/9]
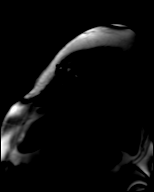

[Series 32: bSSFP · oblique · 6.0mm · 1.41mm/px · 1 of 25 slices shown (17 of 20)]
[im 1/25]
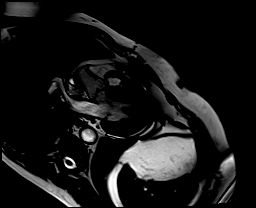

[Series 33: bSSFP · oblique · 6.0mm · 1.41mm/px · 1 of 25 slices shown (18 of 20)]
[im 1/25]
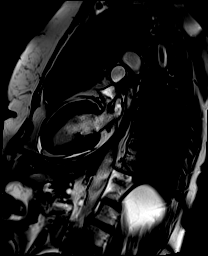

[Series 34: bSSFP · oblique · 6.0mm · 1.41mm/px · 1 of 25 slices shown (19 of 20)]
[im 1/25]
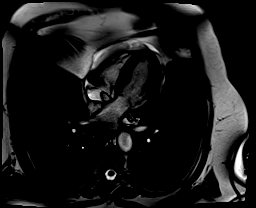

[Series 35: pre short axis · oblique · non-contrast · 8.0mm · 2.25mm/px · 1 of 10 slices shown (1 of 6)]
[im 1/10]
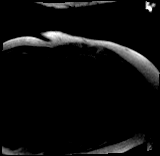

[Series 36: pre short axis · oblique · non-contrast · 8.0mm · 2.25mm/px · 1 of 10 slices shown (2 of 6)]
[im 1/10]
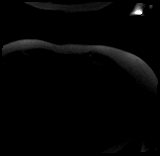

[Series 37: pre short axis · oblique · non-contrast · 8.0mm · 2.25mm/px · 1 of 10 slices shown (3 of 6)]
[im 1/10]
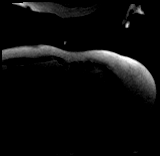

[Series 38: pre short axis · oblique · non-contrast · 8.0mm · 2.25mm/px · 1 of 10 slices shown (4 of 6)]
[im 1/10]
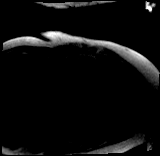

[Series 39: pre short axis · oblique · non-contrast · 8.0mm · 2.25mm/px · 1 of 10 slices shown (5 of 6)]
[im 1/10]
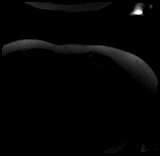

[Series 40: pre short axis · oblique · non-contrast · 8.0mm · 2.25mm/px · 1 of 10 slices shown (6 of 6)]
[im 1/10]
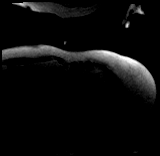

[Series 41: rest short axis · oblique · 8.0mm · 2.25mm/px · 1 of 60 slices shown (1 of 6)]
[im 1/60]
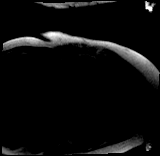

[Series 42: rest short axis · oblique · 8.0mm · 2.25mm/px · 1 of 60 slices shown (2 of 6)]
[im 1/60]
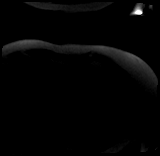

[Series 43: rest short axis · oblique · 8.0mm · 2.25mm/px · 1 of 60 slices shown (3 of 6)]
[im 1/60]
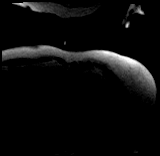

[Series 44: rest short axis · oblique · 8.0mm · 2.25mm/px · 1 of 60 slices shown (4 of 6)]
[im 1/60]
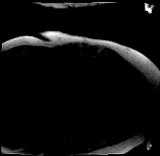

[Series 45: rest short axis · oblique · 8.0mm · 2.25mm/px · 1 of 60 slices shown (5 of 6)]
[im 1/60]
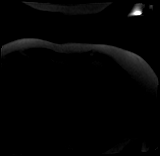

[Series 46: rest short axis · oblique · 8.0mm · 2.25mm/px · 1 of 60 slices shown (6 of 6)]
[im 1/60]
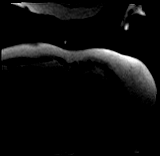

[Series 47: bSSFP · coronal · 6.0mm · 1.41mm/px · 1 of 25 slices shown (20 of 20)]
[im 1/25]
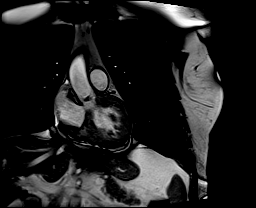

[Series 48: aortic valve cine · axial · 6.0mm · 1.41mm/px · 1 of 25 slices shown]
[im 1/25]
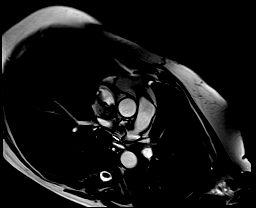

[Series 49: cine rvit · oblique · 6.0mm · 1.41mm/px · 1 of 25 slices shown]
[im 1/25]
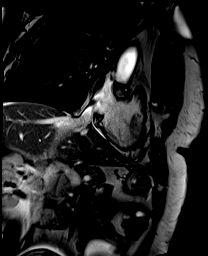

[Series 50: cine rvot · sagittal · 6.0mm · 1.41mm/px · 1 of 25 slices shown]
[im 1/25]
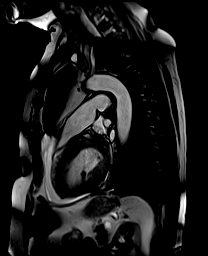

[Series 52: lge_single shot sa · oblique · 8.0mm · 2.08mm/px · 1 of 18 slices shown (1 of 2)]
[im 1/18]
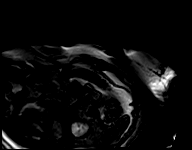

[Series 53: lge_single shot sa · oblique · 8.0mm · 2.08mm/px · 1 of 18 slices shown (2 of 2)]
[im 1/18]
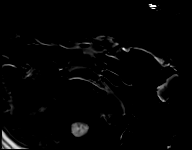

[Series 64: (id)_short_t1 · oblique · 8.0mm · 1.56mm/px · 1 of 27 slices shown]
[im 1/27]
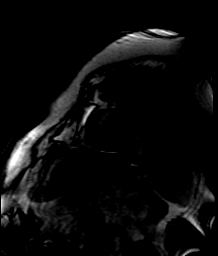

[Series 65: (id)_short_t1_moco · oblique · 8.0mm · 1.56mm/px · 1 of 27 slices shown]
[im 1/27]
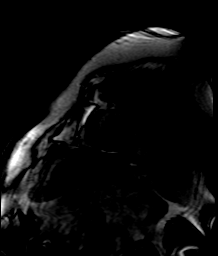

[Series 66: (id)_short_t1_moco_t1 · oblique · 8.0mm · 1.56mm/px · 1 of 6 slices shown]
[im 1/6]
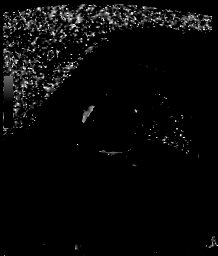

[Series 106: lge 2 ch · sagittal · 8.0mm · 1.67mm/px · 1 of 3 slices shown]
[im 1/3]
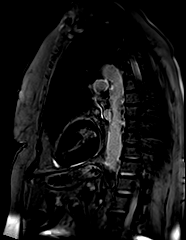

[46 of 48 positions shown; findings below may reference images not displayed]

FINDINGS: Left ventricle:

-Severe concentric hypertrophy measuring up to 18mm in basal septum
(17mm in posterior wall)

-Normal size

-Normal systolic function

-Normal ECV (27%)

-No LGE

LV EF:  56% (Normal 56-78%)

Absolute volumes:

LV EDV: 87mL (Normal 52-141 mL)

LV ESV: 38mL (Normal 13-51 mL)

LV SV: 49mL (Normal 33-97 mL)

CO: 3.4L/min (Normal 2.7-6.0 L/min)

Indexed volumes:

LV EDV: 48mL/sq-m (Normal 41-81 mL/sq-m)

LV ESV: 21mL/sq-m (Normal 12-21 mL/sq-m)

LV SV: 27mL/sq-m (Normal 26-56 mL/sq-m)

CI: 1.9L/min/sq-m (Normal 1.8-3.8 L/min/sq-m)

Right ventricle: Normal size and systolic function (EF 68%)

RV EF: 68% (Normal 47-80%)

Absolute volumes:

RV EDV: 63mL (Normal 58-154 mL)

RV ESV: 20mL (Normal 12-68 mL)

RV SV: 43mL (Normal 35-98 mL)

CO: 3.0L/min (Normal 2.7-6 L/min)

Indexed volumes:

RV EDV: 35mL/sq-m (Normal 48-87 mL/sq-m)

RV ESV: 11mL/sq-m (Normal 11-28 mL/sq-m)

RV SV: 24mL/sq-m (Normal 27-57 mL/sq-m)

CI: 1.7L/min/sq-m (Normal 1.8-3.8 L/min/sq-m)

Left atrium: Normal size. Lipomatous hypertrophy of the interatrial
septum

Right atrium: Normal size

Mitral valve: Trivial regurgitation

Aortic valve: Mild regurgitation

Tricuspid valve: Trivial regurgitation

Pulmonic valve: No regurgitation

Aorta: Normal proximal ascending aorta

Pericardium: Small effusion
IMPRESSION: 1. Severe concentric LV hypertrophy measuring up to 18mm in basal
septum (17mm in posterior wall). No evidence of cardiac amyloidosis.
Suspect LVH is due to aortic stenosis, though considering AS was
mild on echocardiogram and severe LVH is seen, hypertrophic
cardiomyopathy remains on the differential. However there is no late
gadolinium enhancement as often seen in HCM

2.  Normal LV size and systolic function (EF 56%)

3.  Small RV size with normal systolic function (EF 68%)

4.  Lipomatous hypertrophy of the interatrial septum

## 2023-06-23 ENCOUNTER — Ambulatory Visit (INDEPENDENT_AMBULATORY_CARE_PROVIDER_SITE_OTHER): Payer: Medicare PPO

## 2023-06-23 DIAGNOSIS — E538 Deficiency of other specified B group vitamins: Secondary | ICD-10-CM | POA: Diagnosis not present

## 2023-06-23 MED ORDER — CYANOCOBALAMIN 1000 MCG/ML IJ SOLN
1000.0000 ug | Freq: Once | INTRAMUSCULAR | Status: AC
  Administered 2023-06-23: 1000 ug via INTRAMUSCULAR

## 2023-06-23 NOTE — Progress Notes (Signed)
 Per orders of Dr. Claire Crick, injection of B-12 given by Rona Cobia in left deltoid. Patient tolerated injection well. Patient will make appointment for 1 month.

## 2023-07-01 ENCOUNTER — Ambulatory Visit
Admission: RE | Admit: 2023-07-01 | Discharge: 2023-07-01 | Disposition: A | Discharge status: Home / Self Care | Source: Ambulatory Visit

## 2023-07-01 VITALS — BP 122/70 | HR 76 | Temp 98.0°F | Resp 14

## 2023-07-01 DIAGNOSIS — R350 Frequency of micturition: Secondary | ICD-10-CM

## 2023-07-01 HISTORY — DX: Unspecified cataract: H26.9

## 2023-07-01 HISTORY — DX: Allergy, unspecified, initial encounter: T78.40XA

## 2023-07-01 LAB — POCT URINALYSIS DIP (MANUAL ENTRY)
Bilirubin, UA: NEGATIVE
Blood, UA: NEGATIVE
Glucose, UA: NEGATIVE mg/dL
Ketones, POC UA: NEGATIVE mg/dL
Leukocytes, UA: NEGATIVE
Nitrite, UA: NEGATIVE
Protein Ur, POC: NEGATIVE mg/dL
Spec Grav, UA: <=1.005 — AB
Urobilinogen, UA: 0.2 U/dL
pH, UA: 5.5

## 2023-07-01 NOTE — ED Provider Notes (Signed)
 [facility]    CSN: [id number] Arrival date & time: 07/01/23  0851      History   Chief Complaint Chief Complaint  Patient presents with   Urinary Frequency    Entered by patient    HPI Ashlee Allen is a 73 y.o. female.   Patient presents to clinic for concern of intermittent urinary urgency, frequency and flank pain for the past two weeks.  Decided to come into clinic today because of her urinary odor.  Has a history of intermittent urinary tract infections.  Has not had any nausea, vomiting or diarrhea.  Occasional suprapubic pressure with urination.  Has not tried any medications or interventions for her symptoms.  Reports a history of incontinence.  Had her bladder tacked back in her 42s, this only lasted for a few years.  Has not followed up with urology.  The history is provided by the patient and medical records.  Urinary Frequency    Past Medical History:  Diagnosis Date   Allergy    Cardiac murmur    Cataract    Discoid lupus    Dr. Ramiro Burly (prev seen at [facility]) and Dr. Ola Berger   Fibroid    GERD (gastroesophageal reflux disease)    controllled with weight loss    Hyperlipidemia    Pulmonary nodule     Patient Active Problem List   Diagnosis Date Noted   Vaginal dryness 04/26/2023   CAD (coronary artery disease) 04/26/2023   Sinusitis 03/29/2023   Ear drainage 07/30/2022   Vitamin D  deficiency 04/20/2022   B12 deficiency 11/12/2021   Nocturia 11/11/2021   Cough 11/11/2021   LVH (left ventricular hypertrophy) 11/06/2020   Colon cancer screening 06/13/2020   Medicare annual wellness visit, subsequent 01/19/2019   Laryngopharyngeal reflux (LPR) 07/19/2018   Left arm pain 04/11/2018   Neck pain 02/22/2018   Rash 05/27/2017   Health care maintenance 01/14/2017   Dysuria 01/14/2017   Pulmonary nodule 04/23/2016   Advance care planning 12/13/2014   Smoker 10/28/2012   Urinary incontinence 10/28/2012   Menopause 10/14/2010   GERD 01/15/2010    LUPUS ERYTHEMATOSUS, DISCOID 01/15/2010    Past Surgical History:  Procedure Laterality Date   ABDOMINAL HYSTERECTOMY  1997   [facility]  [facility]   APPENDECTOMY     BREAST SURGERY  2001   Duct excised-rt   CATARACT EXTRACTION, BILATERAL     CHOLECYSTECTOMY  1991   COLONOSCOPY     EYE SURGERY     HAND SURGERY  2001   ctr-both   INNER EAR SURGERY     left- mastoidectomy   KNEE ARTHROSCOPY Left 05/20/2013   Procedure: LEFT KNEE ARTHROSCOPY WITH DEBRIDEMENT/SHAVING (CHONDROPLASTY) AND LATERAL AND MEDIAL MENISECTOMY, EXCISION OF PLICA;  Surgeon: Forbes Ida., MD;  Location: [facility];  Service: Orthopedics;  Laterality: Left;   LAPAROSCOPIC APPENDECTOMY N/A 04/22/2016   Procedure: APPENDECTOMY LAPAROSCOPIC;  Surgeon: Juanita Norlander, MD;  Location: [facility];  Service: General;  Laterality: N/A;    OB History     Gravida  3   Para  2   Term  2   Preterm  0   AB  1   Living         SAB  0   IAB  0   Ectopic  0   Multiple      Live Births               Home Medications  Prior to Admission medications   Medication Sig Start Date End Date Taking? Authorizing Provider  albuterol  (VENTOLIN  HFA) 108 (90 Base) MCG/ACT inhaler Inhale 1-2 puffs into the lungs every 6 (six) hours as needed for wheezing or shortness of breath. 03/27/23  Yes Donnie Galea, MD  betamethasone  dipropionate 0.05 % cream Apply topically 2 (two) times daily as needed. 11/21/22  Yes Donnie Galea, MD  Cholecalciferol (VITAMIN D3) 50 MCG (2000 UT) capsule Take 1 capsule (2,000 Units total) by mouth daily. 07/28/22  Yes Donnie Galea, MD  conjugated estrogens  (PREMARIN ) vaginal cream Use 1 gram daily for 1 week then twice a week afterward 18 weeks 04/28/23  Yes Donnie Galea, MD  cyanocobalamin  (VITAMIN B12) 1000 MCG/ML injection 1000mcg IM monthly 02/13/22  Yes Donnie Galea, MD  hydroxychloroquine  (PLAQUENIL ) 200 MG tablet 1-2 tabs per day (wintertime 1 tab a day, summertime  2 tabs a day) 01/12/17  Yes Donnie Galea, MD  triamcinolone cream (KENALOG) 0.1 % Apply topically.    [provider]    Family History Family History  Problem Relation Age of Onset   Alcohol abuse Mother    Hyperlipidemia Mother    Hypertension Mother    Diabetes Mother    Cancer Mother        LUNG- SMOKER    Alcohol abuse Father    Cancer Father        lung   Seizures Brother    Colon cancer Neg Hx    Breast cancer Neg Hx     Social History Social History   Tobacco Use   Smoking status: Every Day    Current packs/day: 1.50    Average packs/day: 1.5 packs/day for 40.0 years (60.0 ttl pk-yrs)    Types: Cigarettes   Smokeless tobacco: Never  Vaping Use   Vaping status: Never Used  Substance Use Topics   Alcohol use: No   Drug use: No     Allergies   Atorvastatin  calcium , Latex, and Varenicline tartrate   Review of Systems Review of Systems  Per HPI  Physical Exam Triage Vital Signs ED Triage Vitals  Encounter Vitals Group     BP 07/01/23 0926 122/70     Systolic BP Percentile --      Diastolic BP Percentile --      Pulse Rate 07/01/23 0926 76     Resp 07/01/23 0926 14     Temp 07/01/23 0926 98 F (36.7 C)     Temp Source 07/01/23 0926 Oral     SpO2 07/01/23 0926 96 %     Weight --      Height --      Head Circumference --      Peak Flow --      Pain Score 07/01/23 0927 0     Pain Loc --      Pain Education --      Exclude from Growth Chart --    No data found.  Updated Vital Signs BP 122/70 (BP Location: Left Arm)   Pulse 76   Temp 98 F (36.7 C) (Oral)   Resp 14   SpO2 96%   Visual Acuity Right Eye Distance:   Left Eye Distance:   Bilateral Distance:    Right Eye Near:   Left Eye Near:    Bilateral Near:     Physical Exam Vitals and nursing note reviewed.  Constitutional:      Appearance: Normal appearance.  HENT:  Head: Normocephalic and atraumatic.     Right Ear: External ear normal.     Left Ear: External  ear normal.     Nose: Nose normal.     Mouth/Throat:     Mouth: Mucous membranes are moist.  Eyes:     Conjunctiva/sclera: Conjunctivae normal.  Cardiovascular:     Rate and Rhythm: Normal rate.  Pulmonary:     Effort: Pulmonary effort is normal. No respiratory distress.  Abdominal:     Tenderness: There is no right CVA tenderness or left CVA tenderness.  Neurological:     General: No focal deficit present.     Mental Status: She is alert.  Psychiatric:        Mood and Affect: Mood normal.      UC Treatments / Results  Labs (all labs ordered are listed, but only abnormal results are displayed) Labs Reviewed  POCT URINALYSIS DIP (MANUAL ENTRY) - Abnormal; Notable for the following components:      Result Value   Spec Grav, UA <=1.005 (*)    All other components within normal limits  URINE CULTURE    EKG   Radiology No results found.  Procedures Procedures (including critical care time)  Medications Ordered in UC Medications - No data to display  Initial Impression / Assessment and Plan / UC Course  I have reviewed the triage vital signs and the nursing notes.  Pertinent labs & imaging results that were available during my care of the patient were reviewed by me and considered in my medical decision making (see chart for details).  Vitals in triage reviewed, patient is hemodynamically stable.  Negative for CVA tenderness, without tachycardia, fevers, low suspicion for pyelonephritis.  POC urine dipstick unremarkable, will send for culture to ensure no UTI.  Staff will contact if treatment is indicated based on culture results.  Plan of care, follow-up care return precautions given, no questions at this time.     Final Clinical Impressions(s) / UC Diagnoses   Final diagnoses:  Urinary frequency   Discharge Instructions   None    ED Prescriptions   None    PDMP not reviewed this encounter.   Harlow Lighter, Willena Jeancharles  N, FNP 07/01/23 1001

## 2023-07-01 NOTE — Discharge Instructions (Addendum)
 Your urine did not show any obvious signs of infection.  We are sending this off for culture and we will contact you if antibiotics are indicated.  In the meantime you ensure you are drinking at least 64 ounces of water daily.  Pure cranberry extract may help as well.  It may be beneficial to follow-up with urology if this is reoccurring.  Return to clinic for any new or urgent symptoms.

## 2023-07-01 NOTE — ED Triage Notes (Signed)
 Pt reports increased urinary frequency, foul odor, oliguria, and R flank pain x2 weeks. Symptoms have been intermittent over the last 2 weeks and she states these are her usual symptoms with her UTIs.

## 2023-07-02 LAB — URINE CULTURE: Culture: NO GROWTH

## 2023-07-09 DIAGNOSIS — L820 Inflamed seborrheic keratosis: Secondary | ICD-10-CM | POA: Diagnosis not present

## 2023-07-09 DIAGNOSIS — L309 Dermatitis, unspecified: Secondary | ICD-10-CM | POA: Diagnosis not present

## 2023-07-16 ENCOUNTER — Encounter (HOSPITAL_COMMUNITY): Payer: Self-pay

## 2023-07-23 ENCOUNTER — Ambulatory Visit: Payer: Medicare PPO

## 2023-07-23 DIAGNOSIS — E538 Deficiency of other specified B group vitamins: Secondary | ICD-10-CM | POA: Diagnosis not present

## 2023-07-23 MED ORDER — CYANOCOBALAMIN 1000 MCG/ML IJ SOLN
1000.0000 ug | Freq: Once | INTRAMUSCULAR | Status: AC
  Administered 2023-07-23: 1000 ug via INTRAMUSCULAR

## 2023-07-23 NOTE — Progress Notes (Signed)
 Per orders of Dr. Crawford Givens, injection of vitamin b 12 given by Lewanda Rife in right deltoid. Patient tolerated injection well. Patient will make appointment for 1 month.

## 2023-08-25 ENCOUNTER — Ambulatory Visit (INDEPENDENT_AMBULATORY_CARE_PROVIDER_SITE_OTHER): Payer: Medicare PPO

## 2023-08-25 DIAGNOSIS — E538 Deficiency of other specified B group vitamins: Secondary | ICD-10-CM

## 2023-08-25 MED ORDER — CYANOCOBALAMIN 1000 MCG/ML IJ SOLN
1000.0000 ug | Freq: Once | INTRAMUSCULAR | Status: AC
  Administered 2023-08-25: 1000 ug via INTRAMUSCULAR

## 2023-08-25 NOTE — Progress Notes (Signed)
 Per orders of Dr. Crawford Givens, injection of vitamin b 12 given by Lewanda Rife in left deltoid. Patient tolerated injection well. Patient will make appointment for 1 month.

## 2023-08-27 ENCOUNTER — Ambulatory Visit
Admission: EM | Admit: 2023-08-27 | Discharge: 2023-08-27 | Disposition: A | Discharge status: Home / Self Care | Attending: Physician Assistant | Admitting: Physician Assistant

## 2023-08-27 ENCOUNTER — Encounter: Payer: Self-pay | Admitting: Physician Assistant

## 2023-08-27 DIAGNOSIS — S61411A Laceration without foreign body of right hand, initial encounter: Secondary | ICD-10-CM | POA: Diagnosis not present

## 2023-08-27 MED ORDER — TETANUS-DIPHTH-ACELL PERTUSSIS 5-2.5-18.5 LF-MCG/0.5 IM SUSY
0.5000 mL | PREFILLED_SYRINGE | Freq: Once | INTRAMUSCULAR | Status: AC
  Administered 2023-08-27: 0.5 mL via INTRAMUSCULAR

## 2023-08-27 NOTE — ED Provider Notes (Addendum)
 [facility]    CSN: [id number] Arrival date & time: 08/27/23  1548      History   Chief Complaint Chief Complaint  Patient presents with   Wound Check    HPI Ashlee Allen is a 73 y.o. female.   Patient here today for evaluation of skin tear to her right hand that occurred 2 hours ago.  She reports she accidentally hit the dorsal surface of her right hand against a picture frame when she was carrying a [employer].  She states that it did seem to pull the skin back and she did have some bleeding but this is controlled at this time.  She denies any numbness or tingling.  She has no pain with movement of her fingers or hand.  The history is provided by the patient.    Past Medical History:  Diagnosis Date   Allergy    Cardiac murmur    Cataract    Discoid lupus    Dr. Ramiro Burly (prev seen at [facility]) and Dr. Ola Berger   Fibroid    GERD (gastroesophageal reflux disease)    controllled with weight loss    Hyperlipidemia    Pulmonary nodule     Patient Active Problem List   Diagnosis Date Noted   Vaginal dryness 04/26/2023   CAD (coronary artery disease) 04/26/2023   Sinusitis 03/29/2023   Ear drainage 07/30/2022   Vitamin D  deficiency 04/20/2022   B12 deficiency 11/12/2021   Nocturia 11/11/2021   Cough 11/11/2021   LVH (left ventricular hypertrophy) 11/06/2020   Colon cancer screening 06/13/2020   Medicare annual wellness visit, subsequent 01/19/2019   Laryngopharyngeal reflux (LPR) 07/19/2018   Left arm pain 04/11/2018   Neck pain 02/22/2018   Rash 05/27/2017   Health care maintenance 01/14/2017   Dysuria 01/14/2017   Pulmonary nodule 04/23/2016   Advance care planning 12/13/2014   Smoker 10/28/2012   Urinary incontinence 10/28/2012   Menopause 10/14/2010   GERD 01/15/2010   LUPUS ERYTHEMATOSUS, DISCOID 01/15/2010    Past Surgical History:  Procedure Laterality Date   ABDOMINAL HYSTERECTOMY  1997   [facility]  [facility]   APPENDECTOMY     BREAST SURGERY   2001   Duct excised-rt   CATARACT EXTRACTION, BILATERAL     CHOLECYSTECTOMY  1991   COLONOSCOPY     EYE SURGERY     HAND SURGERY  2001   ctr-both   INNER EAR SURGERY     left- mastoidectomy   KNEE ARTHROSCOPY Left 05/20/2013   Procedure: LEFT KNEE ARTHROSCOPY WITH DEBRIDEMENT/SHAVING (CHONDROPLASTY) AND LATERAL AND MEDIAL MENISECTOMY, EXCISION OF PLICA;  Surgeon: Forbes Ida., MD;  Location: [facility];  Service: Orthopedics;  Laterality: Left;   LAPAROSCOPIC APPENDECTOMY N/A 04/22/2016   Procedure: APPENDECTOMY LAPAROSCOPIC;  Surgeon: Juanita Norlander, MD;  Location: [facility];  Service: General;  Laterality: N/A;    OB History     Gravida  3   Para  2   Term  2   Preterm  0   AB  1   Living         SAB  0   IAB  0   Ectopic  0   Multiple      Live Births               Home Medications    Prior to Admission medications   Medication Sig Start Date End Date Taking? Authorizing Provider  Cholecalciferol (VITAMIN D3) 50 MCG (2000  UT) capsule Take 1 capsule (2,000 Units total) by mouth daily. 07/28/22  Yes Donnie Galea, MD  cyanocobalamin  (VITAMIN B12) 1000 MCG/ML injection 1000mcg IM monthly 02/13/22  Yes Donnie Galea, MD  hydroxychloroquine  (PLAQUENIL ) 200 MG tablet 1-2 tabs per day (wintertime 1 tab a day, summertime 2 tabs a day) 01/12/17  Yes Donnie Galea, MD  albuterol  (VENTOLIN  HFA) 108 (90 Base) MCG/ACT inhaler Inhale 1-2 puffs into the lungs every 6 (six) hours as needed for wheezing or shortness of breath. 03/27/23   Donnie Galea, MD  betamethasone  dipropionate 0.05 % cream Apply topically 2 (two) times daily as needed. 11/21/22   Donnie Galea, MD  conjugated estrogens  (PREMARIN ) vaginal cream Use 1 gram daily for 1 week then twice a week afterward 18 weeks 04/28/23   Donnie Galea, MD  triamcinolone cream (KENALOG) 0.1 % Apply topically.    [provider]    Family History Family History  Problem Relation Age  of Onset   Alcohol abuse Mother    Hyperlipidemia Mother    Hypertension Mother    Diabetes Mother    Cancer Mother        LUNG- SMOKER    Alcohol abuse Father    Cancer Father        lung   Seizures Brother    Colon cancer Neg Hx    Breast cancer Neg Hx     Social History Social History   Tobacco Use   Smoking status: Every Day    Current packs/day: 1.50    Average packs/day: 1.5 packs/day for 40.0 years (60.0 ttl pk-yrs)    Types: Cigarettes   Smokeless tobacco: Never  Vaping Use   Vaping status: Never Used  Substance Use Topics   Alcohol use: No   Drug use: No     Allergies   Atorvastatin  calcium , Latex, and Varenicline tartrate   Review of Systems Review of Systems  Constitutional:  Negative for chills and fever.  Eyes:  Negative for discharge and redness.  Respiratory:  Negative for shortness of breath.   Gastrointestinal:  Negative for abdominal pain, nausea and vomiting.  Skin:  Positive for color change and wound.     Physical Exam Triage Vital Signs ED Triage Vitals  Encounter Vitals Group     BP      Girls Systolic BP Percentile      Girls Diastolic BP Percentile      Boys Systolic BP Percentile      Boys Diastolic BP Percentile      Pulse      Resp      Temp      Temp src      SpO2      Weight      Height      Head Circumference      Peak Flow      Pain Score      Pain Loc      Pain Education      Exclude from Growth Chart    No data found.  Updated Vital Signs BP 119/70 (BP Location: Left Arm)   Pulse 80   Temp 98.1 F (36.7 C) (Oral)   Resp 16   SpO2 95%   Visual Acuity Right Eye Distance:   Left Eye Distance:   Bilateral Distance:    Right Eye Near:   Left Eye Near:    Bilateral Near:     Physical Exam Vitals and nursing note  reviewed.  Constitutional:      General: She is not in acute distress.    Appearance: Normal appearance. She is not ill-appearing.  HENT:     Head: Normocephalic and atraumatic.   Eyes:      Conjunctiva/sclera: Conjunctivae normal.    Cardiovascular:     Rate and Rhythm: Normal rate.  Pulmonary:     Effort: Pulmonary effort is normal. No respiratory distress.   Skin:    Comments: Skin tear as per photo, no active bleeding, steri strips applied in office.   Neurological:     Mental Status: She is alert.   Psychiatric:        Mood and Affect: Mood normal.        Behavior: Behavior normal.        Thought Content: Thought content normal.      UC Treatments / Results  Labs (all labs ordered are listed, but only abnormal results are displayed) Labs Reviewed - No data to display  EKG   Radiology No results found.  Procedures Procedures (including critical care time)  Medications Ordered in UC Medications  Tdap (BOOSTRIX) injection 0.5 mL (0.5 mLs Intramuscular Given 08/27/23 1642)    Initial Impression / Assessment and Plan / UC Course  I have reviewed the triage vital signs and the nursing notes.  Pertinent labs & imaging results that were available during my care of the patient were reviewed by me and considered in my medical decision making (see chart for details).   Wound closed with Steri-Strips.  Patient due for tetanus vaccination, administered in office.  Advised to monitor for any signs of infection including erythema, swelling or drainage.  Encouraged patient keep wound dry and clean.  Recommend follow-up with any concerns.   Final Clinical Impressions(s) / UC Diagnoses   Final diagnoses:  Skin tear of right hand without complication, initial encounter   Discharge Instructions   None    ED Prescriptions   None    PDMP not reviewed this encounter.   Vernestine Gondola, PA-C 08/27/23 1809    Vernestine Gondola, PA-C 08/27/23 [phone]

## 2023-08-27 NOTE — ED Triage Notes (Signed)
 Skin tear to right hand x 2 hours. States she hit her right hand on framed picture while caring a laundry basket and it pulled the skin back

## 2023-09-18 ENCOUNTER — Ambulatory Visit: Payer: Self-pay

## 2023-09-18 NOTE — Telephone Encounter (Signed)
 Noted. Thanks.

## 2023-09-18 NOTE — Telephone Encounter (Signed)
 FYI Only or Action Required?: FYI only for provider.  Patient was last seen in primary care on 04/23/2023 by Cleatus Arlyss RAMAN, MD.  Called Nurse Triage reporting urinary and Urinary Incontinence.  Symptoms began Chronic.  Interventions attempted: Nothing.  Symptoms are: gradually worsening.  Triage Disposition: See PCP Within 2 Weeks  Patient/caregiver understands and will follow disposition?: Yes  Copied from CRM [id number]. Topic: Appointments - Appointment Scheduling >> Sep 18, 2023  9:18 AM Harlene ORN wrote: Patient is having UTI symptoms that have gotten worse in the past 2 weeks. Reason for Disposition  [1] Can't control passage of urine (i.e., urinary incontinence, wetting self) AND [2] present > 2 weeks  Answer Assessment - Initial Assessment Questions 1. SYMPTOM: What's the main symptom you're concerned about? (e.g., frequency, incontinence)     Incontinence  2. ONSET: When did the  Incontinence  start?     Chronic 3. PAIN: Is there any pain? If Yes, ask: How bad is it? (Scale: 1-10; mild, moderate, severe)     No pain 4. CAUSE: What do you think is causing the symptoms?     unsure 5. OTHER SYMPTOMS: Do you have any other symptoms? (e.g., blood in urine, fever, flank pain, pain with urination)     Denies all other symptoms  Additional info: Patient called in to schedule ov with pcp to discuss ongoing but increasing urinary incontinence. She doesn't feel she has a uti at this time. She would like to keep her previous scheduled appt on 09/24/23 to discuss with provider. She states if she develops symptoms of uti she will call back for triage or if office is closed she will proceed to urgent care.  Protocols used: Urinary Symptoms-A-AH

## 2023-09-18 NOTE — Telephone Encounter (Signed)
Noted. Agree with dispo. 

## 2023-09-24 ENCOUNTER — Encounter: Payer: Self-pay | Admitting: Family Medicine

## 2023-09-24 ENCOUNTER — Ambulatory Visit: Admitting: Family Medicine

## 2023-09-24 ENCOUNTER — Ambulatory Visit: Payer: Medicare PPO

## 2023-09-24 VITALS — BP 118/62 | HR 86 | Temp 98.2°F | Ht 64.5 in | Wt 150.0 lb

## 2023-09-24 DIAGNOSIS — N393 Stress incontinence (female) (male): Secondary | ICD-10-CM

## 2023-09-24 DIAGNOSIS — E538 Deficiency of other specified B group vitamins: Secondary | ICD-10-CM

## 2023-09-24 DIAGNOSIS — R300 Dysuria: Secondary | ICD-10-CM

## 2023-09-24 LAB — POC URINALSYSI DIPSTICK (AUTOMATED)
Bilirubin, UA: NEGATIVE
Blood, UA: NEGATIVE
Glucose, UA: NEGATIVE
Ketones, UA: NEGATIVE
Leukocytes, UA: NEGATIVE
Nitrite, UA: NEGATIVE
Protein, UA: NEGATIVE
Spec Grav, UA: 1.02 (ref 1.010–1.025)
Urobilinogen, UA: NEGATIVE U/dL — AB
pH, UA: 6 (ref 5.0–8.0)

## 2023-09-24 MED ORDER — CYANOCOBALAMIN 1000 MCG/ML IJ SOLN
1000.0000 ug | Freq: Once | INTRAMUSCULAR | Status: AC
  Administered 2023-09-24: 1000 ug via INTRAMUSCULAR

## 2023-09-24 NOTE — Patient Instructions (Signed)
 Urine labs today. We'll update you about that.  Refer to urogynecology.  Take care.  Glad to see you. Update me as needed.

## 2023-09-24 NOTE — Progress Notes (Unsigned)
 Per orders of Dr. Crawford Givens, injection of B-12 given by Leonor Liv in right deltoid. Patient tolerated injection well.

## 2023-09-24 NOTE — Progress Notes (Unsigned)
 B12 dose given at OV.    She is off premarin  cream. She had breast pain with use and stopped.  Then the discomfort resolved.  No breast mass.    Urinary sx d/w pt.  She had odor with urination last week.  She tried to inc fluid intake.  Then had lower abd pain last week. That improved.  Fatigued.  Nocturia.  H/o bladder tack decades ago.  SUI, with cough or sneeze, gradually worse over the years.  Stream is slower to start and stream is weaker than prev.    She has a new granddaughter Melynda Fellows), born June 2025  Meds, vitals, and allergies reviewed.   ROS: Per HPI unless specifically indicated in ROS section   RRR SEM CTAB Abd soft, not ttp Skin well perfused.  No BLE edema.

## 2023-09-25 ENCOUNTER — Ambulatory Visit: Payer: Self-pay | Admitting: Family Medicine

## 2023-09-25 DIAGNOSIS — N393 Stress incontinence (female) (male): Secondary | ICD-10-CM | POA: Insufficient documentation

## 2023-09-25 LAB — URINE CULTURE
MICRO NUMBER:: 16712266
Result:: NO GROWTH
SPECIMEN QUALITY:: ADEQUATE

## 2023-09-25 NOTE — Assessment & Plan Note (Signed)
 See notes on labs.  Check urinalysis and urine culture.  Would treat if urine culture is positive. Refer to urogynecology.  Rationale discussed with patient.  She can update me as needed.

## 2023-09-25 NOTE — Assessment & Plan Note (Signed)
Dose given at office visit. 

## 2023-10-27 ENCOUNTER — Ambulatory Visit (INDEPENDENT_AMBULATORY_CARE_PROVIDER_SITE_OTHER): Payer: Medicare PPO

## 2023-10-27 DIAGNOSIS — E538 Deficiency of other specified B group vitamins: Secondary | ICD-10-CM

## 2023-10-27 MED ORDER — CYANOCOBALAMIN 1000 MCG/ML IJ SOLN
1000.0000 ug | Freq: Once | INTRAMUSCULAR | Status: AC
  Administered 2023-10-27: 1000 ug via INTRAMUSCULAR

## 2023-10-27 NOTE — Progress Notes (Signed)
Per orders of Dr. Elsie Stain, injection of B-12 given by Francella Solian in left deltoid. Patient tolerated injection well. Patient will make appointment for 1 month.

## 2023-11-24 DIAGNOSIS — K9089 Other intestinal malabsorption: Secondary | ICD-10-CM | POA: Diagnosis not present

## 2023-11-24 DIAGNOSIS — R195 Other fecal abnormalities: Secondary | ICD-10-CM | POA: Diagnosis not present

## 2023-11-24 DIAGNOSIS — Z86018 Personal history of other benign neoplasm: Secondary | ICD-10-CM | POA: Diagnosis not present

## 2023-11-24 DIAGNOSIS — Z9049 Acquired absence of other specified parts of digestive tract: Secondary | ICD-10-CM | POA: Diagnosis not present

## 2023-12-01 ENCOUNTER — Ambulatory Visit (INDEPENDENT_AMBULATORY_CARE_PROVIDER_SITE_OTHER): Payer: Medicare PPO

## 2023-12-01 DIAGNOSIS — E538 Deficiency of other specified B group vitamins: Secondary | ICD-10-CM | POA: Diagnosis not present

## 2023-12-01 MED ORDER — CYANOCOBALAMIN 1000 MCG/ML IJ SOLN
1000.0000 ug | Freq: Once | INTRAMUSCULAR | Status: AC
  Administered 2023-12-01: 1000 ug via INTRAMUSCULAR

## 2023-12-01 NOTE — Progress Notes (Signed)
 Per orders of Dr. Crawford Givens, injection of vitamin b 12 given by Lewanda Rife in right deltoid. Patient tolerated injection well. Patient will make appointment for 1 month.

## 2023-12-08 ENCOUNTER — Encounter: Payer: Self-pay | Admitting: Family Medicine

## 2023-12-28 DIAGNOSIS — K5730 Diverticulosis of large intestine without perforation or abscess without bleeding: Secondary | ICD-10-CM | POA: Diagnosis not present

## 2023-12-28 DIAGNOSIS — K648 Other hemorrhoids: Secondary | ICD-10-CM | POA: Diagnosis not present

## 2023-12-28 DIAGNOSIS — Z860101 Personal history of adenomatous and serrated colon polyps: Secondary | ICD-10-CM | POA: Diagnosis not present

## 2023-12-28 DIAGNOSIS — K635 Polyp of colon: Secondary | ICD-10-CM | POA: Diagnosis not present

## 2023-12-28 DIAGNOSIS — Z09 Encounter for follow-up examination after completed treatment for conditions other than malignant neoplasm: Secondary | ICD-10-CM | POA: Diagnosis not present

## 2023-12-28 LAB — HM COLONOSCOPY

## 2023-12-30 DIAGNOSIS — K635 Polyp of colon: Secondary | ICD-10-CM | POA: Diagnosis not present

## 2023-12-31 ENCOUNTER — Ambulatory Visit: Payer: Medicare PPO

## 2023-12-31 DIAGNOSIS — E538 Deficiency of other specified B group vitamins: Secondary | ICD-10-CM | POA: Diagnosis not present

## 2023-12-31 MED ORDER — CYANOCOBALAMIN 1000 MCG/ML IJ SOLN
1000.0000 ug | Freq: Once | INTRAMUSCULAR | Status: AC
  Administered 2023-12-31: 1000 ug via INTRAMUSCULAR

## 2023-12-31 NOTE — Progress Notes (Signed)
 Per orders of Dr. Arlyss Solian, injection of vitamin b 12 given by Laray Arenas in right deltoid. Pt request due to recent inj in lt deltoid. Patient tolerated injection well. Patient will make appointment for 1 month.

## 2024-01-05 ENCOUNTER — Inpatient Hospital Stay
Admission: RE | Admit: 2024-01-05 | Discharge: 2024-01-05 | Disposition: A | Source: Ambulatory Visit | Attending: Acute Care | Admitting: Acute Care

## 2024-01-05 DIAGNOSIS — F17210 Nicotine dependence, cigarettes, uncomplicated: Secondary | ICD-10-CM

## 2024-01-05 DIAGNOSIS — Z122 Encounter for screening for malignant neoplasm of respiratory organs: Secondary | ICD-10-CM

## 2024-01-05 DIAGNOSIS — Z87891 Personal history of nicotine dependence: Secondary | ICD-10-CM

## 2024-01-06 ENCOUNTER — Ambulatory Visit: Admitting: Obstetrics and Gynecology

## 2024-01-13 ENCOUNTER — Telehealth: Payer: Self-pay | Admitting: *Deleted

## 2024-01-13 ENCOUNTER — Other Ambulatory Visit: Payer: Self-pay

## 2024-01-13 DIAGNOSIS — Z122 Encounter for screening for malignant neoplasm of respiratory organs: Secondary | ICD-10-CM

## 2024-01-13 DIAGNOSIS — Z87891 Personal history of nicotine dependence: Secondary | ICD-10-CM

## 2024-01-13 DIAGNOSIS — R911 Solitary pulmonary nodule: Secondary | ICD-10-CM

## 2024-01-13 NOTE — Telephone Encounter (Signed)
 Called patient and reviewed recent Lung CT results. She will complete a 6 month follow up scan on 07/07/2024 at GI. Order placed. Results and plan to PCP.

## 2024-01-13 NOTE — Telephone Encounter (Signed)
 Lauraine Lites, NP has reviewed lung screening CT and recommends a 6 month repeat nodule follow up CT.

## 2024-01-19 ENCOUNTER — Ambulatory Visit: Admitting: Obstetrics and Gynecology

## 2024-01-19 ENCOUNTER — Encounter: Payer: Self-pay | Admitting: Obstetrics and Gynecology

## 2024-01-19 VITALS — BP 134/75 | HR 85 | Ht 64.5 in | Wt 150.0 lb

## 2024-01-19 DIAGNOSIS — R159 Full incontinence of feces: Secondary | ICD-10-CM

## 2024-01-19 DIAGNOSIS — R350 Frequency of micturition: Secondary | ICD-10-CM | POA: Diagnosis not present

## 2024-01-19 DIAGNOSIS — L930 Discoid lupus erythematosus: Secondary | ICD-10-CM | POA: Diagnosis not present

## 2024-01-19 DIAGNOSIS — N952 Postmenopausal atrophic vaginitis: Secondary | ICD-10-CM

## 2024-01-19 DIAGNOSIS — N393 Stress incontinence (female) (male): Secondary | ICD-10-CM

## 2024-01-19 DIAGNOSIS — N3281 Overactive bladder: Secondary | ICD-10-CM

## 2024-01-19 DIAGNOSIS — L821 Other seborrheic keratosis: Secondary | ICD-10-CM | POA: Diagnosis not present

## 2024-01-19 DIAGNOSIS — L814 Other melanin hyperpigmentation: Secondary | ICD-10-CM | POA: Diagnosis not present

## 2024-01-19 LAB — POCT URINALYSIS DIP (CLINITEK)
Bilirubin, UA: NEGATIVE
Blood, UA: NEGATIVE
Glucose, UA: NEGATIVE mg/dL
Ketones, POC UA: NEGATIVE mg/dL
Leukocytes, UA: NEGATIVE
Nitrite, UA: NEGATIVE
POC PROTEIN,UA: NEGATIVE
Spec Grav, UA: 1.025 (ref 1.010–1.025)
Urobilinogen, UA: 0.2 U/dL
pH, UA: 6 (ref 5.0–8.0)

## 2024-01-19 MED ORDER — ESTRADIOL 0.01 % VA CREA: TOPICAL_CREAM | VAGINAL | 11 refills | Status: AC

## 2024-01-19 NOTE — Progress Notes (Unsigned)
 New Patient Evaluation and Consultation  Referring Provider: Cleatus Arlyss RAMAN, MD PCP: Cleatus Arlyss RAMAN, MD Date of Service: 01/19/2024  SUBJECTIVE Chief Complaint: New Patient (Initial Visit) (Ashlee Allen is a 73 y.o. female is here for SUI.)  History of Present Illness: Ashlee Allen is a 73 y.o. {ED SANE [phone] female seen in consultation at the request of {Dr, PA, WE:[id number]} Cleatus for evaluation of ***.    ***Review of records significant for: ***  Urinary Symptoms: Leaks urine with cough/ sneeze, laughing, exercise, lifting, going from sitting to standing, with a full bladder, with movement to the bathroom, with urgency, and while asleep Leaks constantly. More often SUI but hard to tell if she has leakage with urge sometimes.  Pad use: 2 pads per day.   Patient is bothered by UI symptoms. History of a burch colposuspension in the 90s at the time of her hysterectomy. Helped her for a few years.  She has been on one medication for OAB but only used it for a few weeks but did not see a difference.   Day time voids 8-10.  Nocturia: 2-3 times per night to void. Voiding dysfunction:  does not empty bladder well.  Patient does not use a catheter to empty bladder.  When urinating, patient feels a weak stream, difficulty starting urine stream, the need to urinate multiple times in a row, and to push on her belly or vagina to empty bladder Drinks: 2 cups decaf coffee in AM, 24oz water per day, sometimes will sip on coke or tea.   UTIs: 2 UTI's in the last year.   Denies history of blood in urine and kidney or bladder stones No results found for the last 90 days.   Pelvic Organ Prolapse Symptoms:                  Patient Denies a feeling of a bulge the vaginal area. It has been present for {NUMBER 1-10:22536} {days/wks/mos/yrs:310907}.  Patient {denies/ admits to:24761} seeing a bulge.  This bulge {ACTION; IS/IS WNU:78978602} bothersome.  Bowel Symptom: Bowel movements:  4-6 time(s) per day Stool consistency: loose Straining: no.  Splinting: no.  Incomplete evacuation: no.  Patient Admits to accidental bowel leakage / fecal incontinence  Occurs: more often previously  Consistency with leakage: liquid Bowel regimen: Started taking cholestyramine about 2 months ago- stools are more formed now Last colonoscopy: Oct 2025- removed polyps  Sexual Function Sexually active: yes.  Sexual orientation: Straight Pain with sex: Yes, at the vaginal opening, has discomfort due to dryness She has been using premarin  cream. She had some pain in her breast on the left side.   Pelvic Pain Denies pelvic pain   Past Medical History:  Past Medical History:  Diagnosis Date   Allergy    Cardiac murmur    Cataract    Discoid lupus    Dr. Ann (prev seen at [facility]) and Dr. Cary   Fibroid    GERD (gastroesophageal reflux disease)    controllled with weight loss    Hyperlipidemia    Pulmonary nodule      Past Surgical History:   Past Surgical History:  Procedure Laterality Date   ABDOMINAL HYSTERECTOMY  1997   TAH  Burch   APPENDECTOMY     BLADDER NECK SUSPENSION     in her mid 97s   BREAST SURGERY  2001   Duct excised-rt   CATARACT EXTRACTION, BILATERAL     CHOLECYSTECTOMY  1991   COLONOSCOPY  EYE SURGERY     HAND SURGERY  2001   ctr-both   INNER EAR SURGERY     left- mastoidectomy   KNEE ARTHROSCOPY Left 05/20/2013   Procedure: LEFT KNEE ARTHROSCOPY WITH DEBRIDEMENT/SHAVING (CHONDROPLASTY) AND LATERAL AND MEDIAL MENISECTOMY, EXCISION OF PLICA;  Surgeon: LELON JONETTA Shari Mickey., MD;  Location: [facility];  Service: Orthopedics;  Laterality: Left;   LAPAROSCOPIC APPENDECTOMY N/A 04/22/2016   Procedure: APPENDECTOMY LAPAROSCOPIC;  Surgeon: Alm Angle, MD;  Location: [facility];  Service: General;  Laterality: N/A;     Past OB/GYN History: OB History  Gravida Para Term Preterm AB Living  3 2 2  0 1 2  SAB IAB Ectopic Multiple Live Births   0 0 0      # Outcome Date GA Lbr Len/2nd Weight Sex Type Anes PTL Lv  3 AB           2 Term      Vag-Vacuum     1 Term      Vag-Spont       Vaginal deliveries: ***,  Forceps/ Vacuum deliveries: ***, Cesarean section: *** Menopausal: {menopausal:24763} Contraception: ***. Last pap smear was ***.  Any history of abnormal pap smears: {yes/no:19897}. No results found for: DIAGPAP, HPVHIGH, ADEQPAP  Medications: Patient has a current medication list which includes the following prescription(s): albuterol , betamethasone  dipropionate, vitamin d3, cholestyramine, cyanocobalamin , hydroxychloroquine , and triamcinolone cream.   Allergies: Patient is allergic to atorvastatin  calcium , latex, premarin  [conjugated estrogens ], and varenicline tartrate.   Social History:  Social History   Tobacco Use   Smoking status: Every Day    Current packs/day: 1.50    Average packs/day: 1.5 packs/day for 40.0 years (60.0 ttl pk-yrs)    Types: Cigarettes   Smokeless tobacco: Never  Vaping Use   Vaping status: Never Used  Substance Use Topics   Alcohol use: No   Drug use: No    Relationship status: married Patient lives with her husband.   Patient is not employed. Regular exercise: No History of abuse: No  Family History:   Family History  Problem Relation Age of Onset   Alcohol abuse Mother    Hyperlipidemia Mother    Hypertension Mother    Diabetes Mother    Cancer Mother        LUNG- SMOKER    Alcohol abuse Father    Cancer Father        lung   Seizures Brother    Colon cancer Neg Hx    Breast cancer Neg Hx      Review of Systems: ROS   OBJECTIVE Physical Exam: Vitals:   01/19/24 1345  BP: 134/75  Pulse: 85  Weight: 150 lb (68 kg)  Height: 5' 4.5 (1.638 m)    Physical Exam   GU / Detailed Urogynecologic Evaluation:  Pelvic Exam: Normal external female genitalia; Bartholin's and Skene's glands normal in appearance; urethral meatus normal in appearance, no  urethral masses or discharge.   CST: {gen negative/positive:315881}  Reflexes: bulbocavernosis {DESC; PRESENT/NOT PRESENT:21021351}, anocutaneous {DESC; PRESENT/NOT PRESENT:21021351} ***bilaterally.  Speculum exam reveals normal vaginal mucosa {With/Without:20273} atrophy. Cervix {exam; gyn cervix:30847}. Uterus {exam; pelvic uterus:30849}. Adnexa {exam; adnexa:12223}.    s/p hysterectomy: Speculum exam reveals normal vaginal mucosa {With/Without:20273}  atrophy and normal vaginal cuff.  Adnexa {exam; adnexa:12223}.    With apex supported, anterior compartment defect was {reduced:24765}  Pelvic floor strength {Roman # I-V:19040}/V, puborectalis {Roman # I-V:19040}/V external anal sphincter {Roman # I-V:19040}/V  Pelvic floor musculature: Right  levator {Tender/Non-tender:20250}, Right obturator {Tender/Non-tender:20250}, Left levator {Tender/Non-tender:20250}, Left obturator {Tender/Non-tender:20250}  POP-Q:   POP-Q                                               Aa                                               Ba                                                 C                                                Gh                                               Pb                                               tvl                                                Ap                                               Bp                                                 D      Rectal Exam:  Normal sphincter tone, {rectocele:24766} distal rectocele, enterocoele {DESC; PRESENT/NOT PRESENT:21021351}, no rectal masses, {sign of:24767} dyssynergia when asking the patient to bear down.  Post-Void Residual (PVR) by Bladder Scan: In order to evaluate bladder emptying, we discussed obtaining a postvoid residual and patient agreed to this procedure.  Procedure: The ultrasound unit was placed on the patient's abdomen in the suprapubic region after the patient had voided.      Laboratory  Results: Lab Results  Component Value Date   COLORU Amber 09/24/2023   CLARITYU Cloudy 09/24/2023   GLUCOSEUR Negative 09/24/2023   BILIRUBINUR negative 09/24/2023   KETONESU negative 09/24/2023   SPECGRAV 1.020 09/24/2023   RBCUR negative 09/24/2023   PHUR 6.0 09/24/2023   PROTEINUR Negative 09/24/2023   UROBILINOGEN negative (A) 09/24/2023   LEUKOCYTESUR Negative 09/24/2023    Lab Results  Component Value Date  CREATININE 0.67 04/22/2023   CREATININE 0.82 04/10/2022   CREATININE 0.71 11/07/2021    No results found for: HGBA1C  Lab Results  Component Value Date   HGB 14.0 04/22/2023     ASSESSMENT AND PLAN Ms. Mazer is a 73 y.o. with: No diagnosis found.  There are no diagnoses linked to this encounter.   [name], MD

## 2024-01-19 NOTE — Patient Instructions (Addendum)
 Today we talked about ways to manage bladder urgency such as altering your diet to avoid irritative beverages and foods (bladder diet) as well as attempting to decrease stress and other exacerbating factors.   The Most Bothersome Foods* The Least Bothersome Foods*  Coffee - Regular & Decaf Tea - caffeinated Carbonated beverages - cola, non-colas, diet & caffeine-free Alcohols - Beer, Red Wine, White Wine, [address] - Grapefruit, [place], Orange, [employer] - Cranberry, Grapefruit, Orange, Pineapple Vegetables - Tomato & Tomato Products Flavor Enhancers - Hot peppers, Spicy foods, Chili, Horseradish, Vinegar, Monosodium glutamate (MSG) Artificial Sweeteners - NutraSweet, Sweet 'N Low, Equal (sweetener), Saccharin Ethnic foods - Mexican, Thai, Indian food [employer] - low-fat & whole Fruits - Bananas, Blueberries, Honeydew melon, Pears, Raisins, Watermelon Vegetables - Broccoli, [address] Sprouts, [place], Carrots, Cauliflower, Gerty, Cucumber, Mushrooms, Peas, Radishes, Squash, Zucchini, White potatoes, Sweet potatoes & yams Poultry - Chicken, Eggs, Turkey, [employer] - Beef, [employer], Lamb Seafood - Shrimp, Dix fish, Salmon Grains - Oat, Rice Snacks - Pretzels, Popcorn  *Mitch ALF et al. Diet and its role in interstitial cystitis/bladder pain syndrome (IC/BPS) and comorbid conditions. [employer]. BJU Int. 2012 Jan 11.    For treatment of stress urinary incontinence, which is leakage with physical activity/movement/strainging/coughing, we discussed expectant management versus nonsurgical options versus surgery. Nonsurgical options include weight loss, physical therapy, as well as a pessary.  Surgical options include a midurethral sling, which is a synthetic mesh sling that acts like a hammock under the urethra to prevent leakage of urine, a Burch urethropexy, and transurethral injection of a bulking agent.  We discussed the symptoms of overactive bladder (OAB), which  include urinary urgency, urinary frequency, night-time urination, with or without urge incontinence.  We discussed management including behavioral therapy (decreasing bladder irritants by following a bladder diet, urge suppression strategies, timed voids, bladder retraining), physical therapy, medication; and for refractory cases posterior tibial nerve stimulation, sacral neuromodulation, and intravesical botulinum toxin injection.   URODYNAMICS (UDS) TEST INFORMATION  IMPORTANT: Please try to arrive with a comfortably full bladder!  Complete a 3 day bladder diary prior to your appointment.  What is UDS? Urodynamics is a bladder test used to evaluate how your bladder and urethra (tube you urinate out of) work to help find out the cause of your bladder symptoms and evaluate your bladder function in order to make the best treatment plan for you.   What to expect? A nurse will perform the test and will be with you during the entire exam. First we will have to empty your bladder on a special toilet.  After you have emptied your bladder, very small catheters (plastic tubing) will be placed into your bladder and into your vagina (or rectum). These special small catheters measure pressure to help measure your bladder function.  Your bladder will be gently filled with water and you will be asked to cough and strain at several different points during the test.   You will then be asked to empty your bladder in the special toilet with the catheters in place. Most patients can urinate (pee) easily with the catheters in place since the catheters are so small. In total this procedure lasts about 45 minutes to 1 hour.  After your test is completed, you will return (or possibly be seen the same day) to review the results, talk about treatment options and make a plan moving forward.

## 2024-01-20 ENCOUNTER — Encounter: Payer: Self-pay | Admitting: Obstetrics and Gynecology

## 2024-01-20 DIAGNOSIS — N3281 Overactive bladder: Secondary | ICD-10-CM | POA: Insufficient documentation

## 2024-01-20 DIAGNOSIS — R159 Full incontinence of feces: Secondary | ICD-10-CM | POA: Insufficient documentation

## 2024-01-20 NOTE — Assessment & Plan Note (Signed)
-   For treatment of stress urinary incontinence,  non-surgical options include expectant management, weight loss, physical therapy, as well as a pessary.  Surgical options include a midurethral sling, Burch urethropexy, and transurethral injection of a bulking agent. - She has a remote history of burch colposuspension. Discussed alternative treatments. Will have her undergo urodynamic testing due to history of prior procedure and to demonstrate leakage

## 2024-01-20 NOTE — Assessment & Plan Note (Signed)
-   Has resolved with addition of cholestyramine and firmer stools

## 2024-01-20 NOTE — Assessment & Plan Note (Addendum)
-   We discussed the symptoms of overactive bladder (OAB), which include urinary urgency, urinary frequency, nocturia, with or without urge incontinence.  While we do not know the exact etiology of OAB, several treatment options exist. We discussed management including behavioral therapy (decreasing bladder irritants, urge suppression strategies, timed voids, bladder retraining), physical therapy, medication; for refractory cases posterior tibial nerve stimulation, sacral neuromodulation, and intravesical botulinum toxin injection.  - Will reassess need for treatment after urodynamics. 3 day bladder diary provided to complete prior to UDS

## 2024-01-20 NOTE — Assessment & Plan Note (Signed)
-   start vaginal estrogen cream twice a week.

## 2024-01-28 DIAGNOSIS — Z1231 Encounter for screening mammogram for malignant neoplasm of breast: Secondary | ICD-10-CM | POA: Diagnosis not present

## 2024-01-28 LAB — HM MAMMOGRAPHY

## 2024-02-01 ENCOUNTER — Ambulatory Visit: Payer: Self-pay | Admitting: Family Medicine

## 2024-02-02 ENCOUNTER — Ambulatory Visit: Payer: Medicare PPO

## 2024-02-02 DIAGNOSIS — E538 Deficiency of other specified B group vitamins: Secondary | ICD-10-CM | POA: Diagnosis not present

## 2024-02-02 MED ORDER — CYANOCOBALAMIN 1000 MCG/ML IJ SOLN
1000.0000 ug | Freq: Once | INTRAMUSCULAR | Status: AC
  Administered 2024-02-02: 1000 ug via INTRAMUSCULAR

## 2024-02-02 NOTE — Progress Notes (Signed)
 Per orders of Dr. Crawford Givens, injection of vitamin b 12 given by Lewanda Rife in right deltoid. Patient tolerated injection well. Patient will make appointment for 1 month.

## 2024-02-07 ENCOUNTER — Telehealth: Payer: Self-pay | Admitting: Family Medicine

## 2024-02-07 NOTE — Telephone Encounter (Signed)
 Please call pt.  She had prev lung CT done noting incidental cardiac calcifications.  She had not tolerated statins in the past. Please offer cards eval re: nonstatin options. Let me know if she needs a referral.  Thanks.

## 2024-03-08 ENCOUNTER — Ambulatory Visit (INDEPENDENT_AMBULATORY_CARE_PROVIDER_SITE_OTHER): Payer: Medicare PPO

## 2024-03-08 DIAGNOSIS — E538 Deficiency of other specified B group vitamins: Secondary | ICD-10-CM | POA: Diagnosis not present

## 2024-03-08 MED ORDER — CYANOCOBALAMIN 1000 MCG/ML IJ SOLN
1000.0000 ug | Freq: Once | INTRAMUSCULAR | Status: AC
  Administered 2024-03-08: 1000 ug via INTRAMUSCULAR

## 2024-03-08 NOTE — Progress Notes (Signed)
 Per orders of Dr. Arlyss Solian, who is out of office today ands Dr Anton Blas who is in office injection of vitamin b 12 given by Laray Arenas in left deltoid. Patient tolerated injection well. Patient will make appointment for 1 month.

## 2024-03-16 ENCOUNTER — Ambulatory Visit: Admitting: Obstetrics and Gynecology

## 2024-03-16 VITALS — BP 152/71 | HR 80

## 2024-03-16 DIAGNOSIS — N3281 Overactive bladder: Secondary | ICD-10-CM

## 2024-03-16 DIAGNOSIS — R948 Abnormal results of function studies of other organs and systems: Secondary | ICD-10-CM

## 2024-03-16 DIAGNOSIS — N393 Stress incontinence (female) (male): Secondary | ICD-10-CM | POA: Diagnosis not present

## 2024-03-16 DIAGNOSIS — R350 Frequency of micturition: Secondary | ICD-10-CM

## 2024-03-16 LAB — POCT URINALYSIS DIP (CLINITEK)
Bilirubin, UA: NEGATIVE
Blood, UA: NEGATIVE
Glucose, UA: NEGATIVE mg/dL
Ketones, POC UA: NEGATIVE mg/dL
Leukocytes, UA: NEGATIVE
Nitrite, UA: NEGATIVE
POC PROTEIN,UA: NEGATIVE
Spec Grav, UA: <=1.005 — AB
Urobilinogen, UA: 0.2 U/dL
pH, UA: 6

## 2024-03-16 NOTE — Addendum Note (Signed)
 Addended by: Khrystina Bonnes N on: 03/16/2024 10:04 AM   Modules accepted: Orders

## 2024-03-16 NOTE — Progress Notes (Addendum)
 [facility] Urodynamics Procedure  Referring Physician: Cleatus Arlyss RAMAN, MD Date of Procedure: 03/16/2024  Ashlee Allen is a 74 y.o. female who presents for urodynamic evaluation. Indication(s) for study: mixed incontinence  Vital Signs: BP (!) 152/71   Pulse 80   Laboratory Results: A catheterized urine specimen revealed:  POC urine:  Lab Results  Component Value Date   COLORU yellow 01/19/2024   CLARITYU clear 01/19/2024   GLUCOSEUR negative 01/19/2024   BILIRUBINUR negative 01/19/2024   KETONESU negative 09/24/2023   SPECGRAV 1.025 01/19/2024   RBCUR negative 01/19/2024   PHUR 6.0 01/19/2024   PROTEINUR Negative 09/24/2023   UROBILINOGEN 0.2 01/19/2024   LEUKOCYTESUR Negative 01/19/2024     Voiding Diary: Deferred   Procedure Timeout:  The correct patient was verified and the correct procedure was verified. The patient was in the correct position and safety precautions were reviewed based on at the patient's history.  Urodynamic Procedure A 44F dual lumen urodynamics catheter was placed under sterile conditions into the patient's bladder. A 44F catheter was placed into the rectum in order to measure abdominal pressure. EMG patches were placed in the appropriate position.  All connections were confirmed and calibrations/adjusted made. Saline was instilled into the bladder through the dual lumen catheters.  Cough/valsalva pressures were measured periodically during filling.  Patient was allowed to void.  The bladder was then emptied of its residual.  UROFLOW: Revealed a Qmax of 26.3 mL/sec.  She voided 244 mL and had a residual of 255 mL.  It was a intermittent pattern and represented normal habits.  CMG: This was performed with sterile water in the sitting position at a fill rate of 30 mL/min.    First sensation of fullness was 162 mLs,  First urge was 209 mLs,  Strong urge was 258 mLs and  Capacity was 648 mLs  Stress incontinence was  demonstrated Highest positive CLPP was 108 cmH20 at 252 ml. Highest positive VLPP was 119 cmH20at 252 ml.   Detrusor function was normal, with no phasic contractions seen.    Compliance:  Low Normal. End fill detrusor pressure was 19cmH20.  Calculated compliance was 1mL/cmH20  UPP: MUCP without barrier reduction was 53 cm of water.    MICTURITION STUDY: Voiding was performed without reduction in the sitting position.  Pdet at Qmax was 6 cm of water.  Qmax was 7 mL/sec.  It was a intermittent pattern.  She voided 198 mL and had a residual of 450 mL.  It was a volitional void, sustained detrusor contraction was present and abdominal straining was present  EMG: This was performed with patches.  She had voluntary contractions, recruitment with fill was present and urethral sphincter was not relaxed with void.  The details of the procedure with the study tracings have been scanned into EPIC.   Urodynamic Impression:  1. Sensation was normal; capacity was normal 2. Stress Incontinence was demonstrated at normal pressures; 3. Detrusor Overactivity was not demonstrated. 4. Emptying was dysfunctional with a elevated PVR ( with micturition, 255 with uroflow), an underactive contraction present (pdet qmax 5.9 cm H20),  abdominal straining present, dyssynergic urethral sphincter activity on EMG.  Plan: - The patient will follow up  to discuss the findings and treatment options.

## 2024-03-21 ENCOUNTER — Encounter: Payer: Self-pay | Admitting: *Deleted

## 2024-03-23 ENCOUNTER — Ambulatory Visit: Admitting: Obstetrics and Gynecology

## 2024-03-23 ENCOUNTER — Encounter: Payer: Self-pay | Admitting: Obstetrics and Gynecology

## 2024-03-23 VITALS — BP 118/76 | HR 98

## 2024-03-23 DIAGNOSIS — R339 Retention of urine, unspecified: Secondary | ICD-10-CM | POA: Insufficient documentation

## 2024-03-23 DIAGNOSIS — N393 Stress incontinence (female) (male): Secondary | ICD-10-CM | POA: Diagnosis not present

## 2024-03-23 NOTE — Assessment & Plan Note (Signed)
-   Discussed that testing showed decreased detrusor function which is leading to incomplete bladder emptying.  - Recommended learning to self catheterize to provide relief and to record baseline PVRs.  - We discussed the role of sacral neuromodulation and how it works. It requires a test phase, and documentation of bladder function via diary. After a successful test period, a permanent wire and generator are placed in the OR. The battery lasts 10-15 years on average and would need to be replaced surgically.  The goal of this therapy is at least a 50% improvement in symptoms. We reviewed the fact that about 30% of patients fail the test phase and are not candidates for permanent generator placement.   - She is interested in SNM trial. Will plan to do Stage 1 in OR to allow longer trial period of 2 weeks. Will have her return for self cath visit so she can keep a baseline bladder diary of PVRs and then will plan to repeat during trial.

## 2024-03-23 NOTE — Progress Notes (Signed)
 [facility] Return Visit  SUBJECTIVE  History of Present Illness: Ashlee Allen is a 74 y.o. female seen in follow-up for incontinence. She underwent urodynamic testing.   Urodynamic Impression:  1. Sensation was normal; capacity was normal 2. Stress Incontinence was demonstrated at normal pressures; 3. Detrusor Overactivity was not demonstrated. 4. Emptying was dysfunctional with a elevated PVR ( with micturition, 255 with uroflow), an underactive contraction present (pdet qmax 5.9 cm H20),  abdominal straining present, dyssynergic urethral sphincter activity on EMG.  Past Medical History: Patient  has a past medical history of Allergy, Cardiac murmur, Cataract, Discoid lupus, Fibroid, GERD (gastroesophageal reflux disease), Hyperlipidemia, and Pulmonary nodule.   Past Surgical History: She  has a past surgical history that includes Hand surgery (2001); Inner ear surgery; Cholecystectomy (1991); Abdominal hysterectomy (1997); Colonoscopy; Breast surgery (2001); Knee arthroscopy (Left, 05/20/2013); Cataract extraction, bilateral; laparoscopic appendectomy (N/A, 04/22/2016); Appendectomy; Eye surgery; and Bladder neck suspension.   Medications: She has a current medication list which includes the following prescription(s): albuterol , augmented betamethasone  dipropionate, betamethasone  dipropionate, vitamin d3, colestipol, cyanocobalamin , estradiol , hydroxychloroquine , and triamcinolone cream.   Allergies: Patient is allergic to atorvastatin  calcium , latex, premarin  [conjugated estrogens ], and varenicline tartrate.   Social History: Patient  reports that she has been smoking cigarettes. She has a 60 pack-year smoking history. She has never used smokeless tobacco. She reports that she does not drink alcohol and does not use drugs.     OBJECTIVE     Physical Exam: Vitals:   03/23/24 0803  BP: 118/76  Pulse: 98   Gen: No apparent distress, A&O x 3.     ASSESSMENT AND PLAN    Ashlee Allen is a 74 y.o. with:  1. Incomplete bladder emptying   2. SUI (stress urinary incontinence, female)     Incomplete bladder emptying Assessment & Plan: - Discussed that testing showed decreased detrusor function which is leading to incomplete bladder emptying.  - Recommended learning to self catheterize to provide relief and to record baseline PVRs.  - We discussed the role of sacral neuromodulation and how it works. It requires a test phase, and documentation of bladder function via diary. After a successful test period, a permanent wire and generator are placed in the OR. The battery lasts 10-15 years on average and would need to be replaced surgically.  The goal of this therapy is at least a 50% improvement in symptoms. We reviewed the fact that about 30% of patients fail the test phase and are not candidates for permanent generator placement.   - She is interested in SNM trial. Will plan to do Stage 1 in OR to allow longer trial period of 2 weeks. Will have her return for self cath visit so she can keep a baseline bladder diary of PVRs and then will plan to repeat during trial.   Orders: -     Ambulatory Referral For Surgery Scheduling  SUI (stress urinary incontinence, female) Assessment & Plan: - Remote history of burch colposuspension - We discussed that we cannot address SUI symptoms until incomplete emptying has resolved.       Ashlee LOISE Caper, MD

## 2024-03-23 NOTE — Assessment & Plan Note (Signed)
-   Remote history of burch colposuspension - We discussed that we cannot address SUI symptoms until incomplete emptying has resolved.

## 2024-03-29 ENCOUNTER — Ambulatory Visit

## 2024-03-29 DIAGNOSIS — Z48816 Encounter for surgical aftercare following surgery on the genitourinary system: Secondary | ICD-10-CM

## 2024-03-29 NOTE — Progress Notes (Signed)
 patient came in for a cath teaching. Several attempts were made and Ashlee Allen was able to demonstrate self-cathing with a 12 fr catheter.  We used Betadine to clean the urethra and lubricant for cathing. Teaching material was given to the patient along with sample catheters, a diary and measurement tools.

## 2024-03-29 NOTE — Patient Instructions (Signed)
 It was a pleasure to see you today!  Dont forget to bring us  or mychart your log.  Thank you for trusting me with your care!

## 2024-04-05 ENCOUNTER — Telehealth: Payer: Self-pay | Admitting: Family Medicine

## 2024-04-06 ENCOUNTER — Telehealth: Payer: Self-pay | Admitting: Family Medicine

## 2024-04-06 ENCOUNTER — Ambulatory Visit: Payer: Medicare PPO

## 2024-04-06 NOTE — Telephone Encounter (Signed)
 ERROR

## 2024-04-06 NOTE — Telephone Encounter (Signed)
 Copied from CRM #[id number]. Topic: General - Other >> Apr 06, 2024  2:38 PM Ashlee Allen wrote: Reason for CRM: Pt had a video visit scheduled today for 1:50. Pt stated she has been waiting since 1:45pm and no one has came to the visit. Pt would like to continue with the visit.

## 2024-04-07 ENCOUNTER — Ambulatory Visit

## 2024-04-07 DIAGNOSIS — E538 Deficiency of other specified B group vitamins: Secondary | ICD-10-CM

## 2024-04-07 MED ORDER — CYANOCOBALAMIN 1000 MCG/ML IJ SOLN
1000.0000 ug | Freq: Once | INTRAMUSCULAR | Status: AC
  Administered 2024-04-07: 1000 ug via INTRAMUSCULAR

## 2024-04-07 NOTE — Progress Notes (Signed)
 Per orders of Dr. Crawford Givens, injection of vitamin b 12 given by Lewanda Rife in right deltoid. Patient tolerated injection well. Patient will make appointment for 1 month.

## 2024-04-08 ENCOUNTER — Ambulatory Visit

## 2024-04-11 DIAGNOSIS — I350 Nonrheumatic aortic (valve) stenosis: Secondary | ICD-10-CM | POA: Insufficient documentation

## 2024-04-11 DIAGNOSIS — R42 Dizziness and giddiness: Secondary | ICD-10-CM | POA: Insufficient documentation

## 2024-04-13 ENCOUNTER — Ambulatory Visit: Admitting: Cardiology

## 2024-04-13 VITALS — BP 128/78 | HR 77 | Ht 65.5 in | Wt 149.6 lb

## 2024-04-13 DIAGNOSIS — I350 Nonrheumatic aortic (valve) stenosis: Secondary | ICD-10-CM

## 2024-04-13 DIAGNOSIS — I517 Cardiomegaly: Secondary | ICD-10-CM

## 2024-04-13 DIAGNOSIS — R42 Dizziness and giddiness: Secondary | ICD-10-CM | POA: Diagnosis not present

## 2024-04-13 NOTE — Patient Instructions (Signed)
 Medication Instructions:  Your physician recommends that you continue on your current medications as directed. Please refer to the Current Medication list given to you today.  *If you need a refill on your cardiac medications before your next appointment, please call your pharmacy*  Lab Work: NONE If you have labs (blood work) drawn today and your tests are completely normal, you will receive your results only by: MyChart Message (if you have MyChart) OR A paper copy in the mail If you have any lab test that is abnormal or we need to change your treatment, we will call you to review the results.  Testing/Procedures: NONE  Follow-Up: At [facility], you and your health needs are our priority.  As part of our continuing mission to provide you with exceptional heart care, our providers are all part of one team.  This team includes your primary Cardiologist (physician) and Advanced Practice Providers or APPs (Physician Assistants and Nurse Practitioners) who all work together to provide you with the care you need, when you need it.  Your next appointment:   18 months  Provider:   [place], MD  We recommend signing up for the patient portal called MyChart.  Sign up information is provided on this After Visit Summary.  MyChart is used to connect with patients for Virtual Visits (Telemedicine).  Patients are able to view lab/test results, encounter notes, upcoming appointments, etc.  Non-urgent messages can be sent to your provider as well.   To learn more about what you can do with MyChart, go to [email or web address].   Other Instructions [email or web address]

## 2024-04-14 ENCOUNTER — Telehealth: Payer: Self-pay

## 2024-04-14 NOTE — Telephone Encounter (Signed)
 Patient called stating she want to come come back in for another CIC due to her not being able to succeed at cathing. She wants to bring her husband in to learn with her.

## 2024-04-18 ENCOUNTER — Ambulatory Visit

## 2024-05-05 ENCOUNTER — Ambulatory Visit

## 2024-06-02 ENCOUNTER — Ambulatory Visit

## 2024-07-05 ENCOUNTER — Ambulatory Visit

## 2024-07-07 ENCOUNTER — Other Ambulatory Visit

## 2024-07-28 ENCOUNTER — Ambulatory Visit

## 2024-08-04 ENCOUNTER — Ambulatory Visit

## 2024-09-06 ENCOUNTER — Ambulatory Visit

## 2024-10-06 ENCOUNTER — Ambulatory Visit

## 2024-11-08 ENCOUNTER — Ambulatory Visit

## 2024-12-08 ENCOUNTER — Ambulatory Visit

## 2025-01-10 ENCOUNTER — Ambulatory Visit

## 2025-02-09 ENCOUNTER — Ambulatory Visit
